# Patient Record
Sex: Male | Born: 1945 | Race: White | Hispanic: No | Marital: Married | Smoking: Former smoker
Health system: Southern US, Community
[De-identification: ages and names within clinical notes are randomized; demographics above are authoritative.]

## PROBLEM LIST (undated history)

## (undated) DIAGNOSIS — F419 Anxiety disorder, unspecified: Secondary | ICD-10-CM

## (undated) DIAGNOSIS — I219 Acute myocardial infarction, unspecified: Secondary | ICD-10-CM

## (undated) DIAGNOSIS — Z8719 Personal history of other diseases of the digestive system: Secondary | ICD-10-CM

## (undated) DIAGNOSIS — F32A Depression, unspecified: Secondary | ICD-10-CM

## (undated) DIAGNOSIS — T4145XA Adverse effect of unspecified anesthetic, initial encounter: Secondary | ICD-10-CM

## (undated) DIAGNOSIS — R51 Headache: Secondary | ICD-10-CM

## (undated) DIAGNOSIS — G709 Myoneural disorder, unspecified: Secondary | ICD-10-CM

## (undated) DIAGNOSIS — I2510 Atherosclerotic heart disease of native coronary artery without angina pectoris: Secondary | ICD-10-CM

## (undated) DIAGNOSIS — F329 Major depressive disorder, single episode, unspecified: Secondary | ICD-10-CM

## (undated) DIAGNOSIS — T8859XA Other complications of anesthesia, initial encounter: Secondary | ICD-10-CM

## (undated) DIAGNOSIS — R0602 Shortness of breath: Secondary | ICD-10-CM

## (undated) DIAGNOSIS — I10 Essential (primary) hypertension: Secondary | ICD-10-CM

## (undated) DIAGNOSIS — M5090 Cervical disc disorder, unspecified, unspecified cervical region: Secondary | ICD-10-CM

## (undated) DIAGNOSIS — M7120 Synovial cyst of popliteal space [Baker], unspecified knee: Secondary | ICD-10-CM

## (undated) DIAGNOSIS — K219 Gastro-esophageal reflux disease without esophagitis: Secondary | ICD-10-CM

## (undated) DIAGNOSIS — E7800 Pure hypercholesterolemia, unspecified: Secondary | ICD-10-CM

## (undated) DIAGNOSIS — M1990 Unspecified osteoarthritis, unspecified site: Secondary | ICD-10-CM

## (undated) HISTORY — PX: SHOULDER SURGERY: SHX246

## (undated) HISTORY — PX: COLONOSCOPY: SHX174

## (undated) HISTORY — PX: LAMINECTOMY AND MICRODISCECTOMY CERVICAL SPINE: SHX1912

## (undated) HISTORY — PX: CORONARY ANGIOPLASTY: SHX604

---

## 1999-08-11 HISTORY — PX: CORONARY STENT PLACEMENT: SHX1402

## 1999-08-15 ENCOUNTER — Emergency Department (HOSPITAL_COMMUNITY): Admission: EM | Admit: 1999-08-15 | Discharge: 1999-08-15 | Payer: Self-pay | Admitting: *Deleted

## 2000-01-05 ENCOUNTER — Inpatient Hospital Stay (HOSPITAL_COMMUNITY): Admission: EM | Admit: 2000-01-05 | Discharge: 2000-01-07 | Payer: Self-pay | Admitting: Emergency Medicine

## 2000-01-05 ENCOUNTER — Encounter: Payer: Self-pay | Admitting: *Deleted

## 2000-02-17 ENCOUNTER — Encounter (HOSPITAL_COMMUNITY): Admission: RE | Admit: 2000-02-17 | Discharge: 2000-05-17 | Payer: Self-pay | Admitting: Cardiology

## 2004-07-04 ENCOUNTER — Emergency Department (HOSPITAL_COMMUNITY): Admission: EM | Admit: 2004-07-04 | Discharge: 2004-07-04 | Payer: Self-pay | Admitting: Emergency Medicine

## 2005-06-12 ENCOUNTER — Encounter: Admission: RE | Admit: 2005-06-12 | Discharge: 2005-06-12 | Payer: Self-pay | Admitting: Endocrinology

## 2005-06-14 ENCOUNTER — Encounter: Admission: RE | Admit: 2005-06-14 | Discharge: 2005-06-14 | Payer: Self-pay | Admitting: Endocrinology

## 2006-10-28 ENCOUNTER — Ambulatory Visit (HOSPITAL_BASED_OUTPATIENT_CLINIC_OR_DEPARTMENT_OTHER): Admission: RE | Admit: 2006-10-28 | Discharge: 2006-10-28 | Payer: Self-pay | Admitting: Orthopedic Surgery

## 2007-09-22 ENCOUNTER — Encounter
Admission: RE | Admit: 2007-09-22 | Discharge: 2007-09-22 | Payer: Self-pay | Admitting: Physical Medicine and Rehabilitation

## 2008-08-29 ENCOUNTER — Inpatient Hospital Stay (HOSPITAL_COMMUNITY): Admission: RE | Admit: 2008-08-29 | Discharge: 2008-09-02 | Payer: Self-pay | Admitting: Neurosurgery

## 2008-08-29 ENCOUNTER — Ambulatory Visit: Payer: Self-pay | Admitting: Infectious Diseases

## 2009-03-12 ENCOUNTER — Inpatient Hospital Stay (HOSPITAL_COMMUNITY): Admission: RE | Admit: 2009-03-12 | Discharge: 2009-03-14 | Payer: Self-pay | Admitting: Neurosurgery

## 2010-11-15 LAB — COMPREHENSIVE METABOLIC PANEL
ALT: 22 U/L (ref 0–53)
AST: 24 U/L (ref 0–37)
Alkaline Phosphatase: 54 U/L (ref 39–117)
CO2: 30 mEq/L (ref 19–32)
Calcium: 9.2 mg/dL (ref 8.4–10.5)
GFR calc Af Amer: >60 mL/min (ref >60)
Potassium: 4.1 mEq/L (ref 3.5–5.1)
Sodium: 139 mEq/L (ref 135–145)
Total Protein: 6.5 g/dL (ref 6.0–8.3)

## 2010-11-15 LAB — URINALYSIS, ROUTINE W REFLEX MICROSCOPIC
Glucose, UA: NEGATIVE mg/dL
Hgb urine dipstick: NEGATIVE
Ketones, ur: NEGATIVE mg/dL
Protein, ur: NEGATIVE mg/dL

## 2010-11-15 LAB — CBC
Hemoglobin: 13.6 g/dL (ref 13.0–17.0)
MCHC: 34.6 g/dL (ref 30.0–36.0)
WBC: 6.8 10*3/uL (ref 4.0–10.5)

## 2010-11-15 LAB — DIFFERENTIAL
Eosinophils Absolute: 0.1 10*3/uL (ref 0.0–0.7)
Eosinophils Relative: 2 % (ref 0–5)
Lymphs Abs: 1.6 10*3/uL (ref 0.7–4.0)
Monocytes Relative: 5 % (ref 3–12)

## 2010-11-24 LAB — COMPREHENSIVE METABOLIC PANEL
ALT: 28 U/L (ref 0–53)
BUN: 11 mg/dL (ref 6–23)
CO2: 29 mEq/L (ref 19–32)
Calcium: 9.8 mg/dL (ref 8.4–10.5)
Creatinine, Ser: 0.98 mg/dL (ref 0.4–1.5)
GFR calc non Af Amer: >60 mL/min (ref >60)
Glucose, Bld: 89 mg/dL (ref 70–99)

## 2010-11-24 LAB — DIFFERENTIAL
Eosinophils Absolute: 0.1 10*3/uL (ref 0.0–0.7)
Lymphocytes Relative: 27 % (ref 12–46)
Lymphs Abs: 1.8 10*3/uL (ref 0.7–4.0)
Neutrophils Relative %: 65 % (ref 43–77)

## 2010-11-24 LAB — CBC
HCT: 43.3 % (ref 39.0–52.0)
Hemoglobin: 14.5 g/dL (ref 13.0–17.0)
MCHC: 33.5 g/dL (ref 30.0–36.0)
MCV: 91.3 fL (ref 78.0–100.0)
RBC: 4.75 MIL/uL (ref 4.22–5.81)

## 2010-11-24 LAB — PROTIME-INR
INR: 0.9 (ref 0.00–1.49)
Prothrombin Time: 12.5 seconds (ref 11.6–15.2)

## 2010-11-24 LAB — URINALYSIS, ROUTINE W REFLEX MICROSCOPIC
Hgb urine dipstick: NEGATIVE
Protein, ur: NEGATIVE mg/dL
Urobilinogen, UA: 0.2 mg/dL (ref 0.0–1.0)
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
## 2010-12-23 NOTE — Op Note (Signed)
NAME:  KROSS, SWALLOWS NO.:  [id number]   MEDICAL RECORD NO.:  [id number]          PATIENT TYPE:  INP   LOCATION:  3028                         FACILITY:  [facility]   PHYSICIAN:  Payton Doughty, M.D.      DATE OF BIRTH:  [birth year]   DATE OF PROCEDURE:  03/12/2009  DATE OF DISCHARGE:                               OPERATIVE REPORT   PREOPERATIVE DIAGNOSIS:  Spondylosis C6-7.   POSTOPERATIVE DIAGNOSIS:  Spondylosis C6-7.   PROCEDURE:  C6-7 posterior cervical fusion.   SURGEON:  Payton Doughty, MD   ANESTHESIA:  General endotracheal.   PREPARATION:  Prepped and draped with alcohol wipe.   COMPLICATIONS:  None.   ASSISTANT:  Cristi Loron, MD   BODY OF TEXT:  This is a 65 year old gentleman who had an anterior  decompression and fusion done from C3 through C6 about 8 months ago has  had increasing neck pain and films show spondylosis at C6-7.  He was  taken to the operating room, and after shave, prep and drape in the  usual sterile fashion, he was placed on the Stryker table and smoothly  anesthetized and intubated.  He was placed prone on the Stryker table  with 5 pounds of Gardner-Wells traction.  Following shave, prep and  drape in the usual sterile fashion, the skin was incised from the bottom  of C7 to the top of C6, and lamina of C6 and C7 were exposed in the  subperiosteal plane.  Intraoperative x-ray confirmed correctness of  level.  Having confirmed correctness of level, lateral mass screws were  placed in C6 and C7.  Using the standard landmarks, good bone was  encountered throughout.  The screws were linked with a rod and then  tightened and the locking caps placed.  They were tightened with final  tightener.  Wound was irrigated.  Hemostasis assured.  The lamina and  facet joints were decorticated and packed with BMP on the Actifuse  extender matrix.  Successive layers of 0 Vicryl, 2-0 Vicryl, and 3-0  nylon were used to close.  Betadine and Telfa  dressing was applied, made  occlusive with the OpSite, and the patient returned to the recovery room  in good condition.           ______________________________  Payton Doughty, M.D.     MWR/MEDQ  D:  03/12/2009  T:  03/13/2009  Job:  [id number]

## 2010-12-23 NOTE — Op Note (Signed)
NAME:  SONY, SCHLARB NO.:  [id number]   MEDICAL RECORD NO.:  [id number]          PATIENT TYPE:  INP   LOCATION:  3015                         FACILITY:  [facility]   PHYSICIAN:  Payton Doughty, M.D.      DATE OF BIRTH:  03-11-1946   DATE OF PROCEDURE:  08/29/2008  DATE OF DISCHARGE:                               OPERATIVE REPORT   PREOPERATIVE DIAGNOSIS:  Cervical spondylosis C3-4, C4-5, C5-6.   PREOPERATIVE DIAGNOSIS:  Cervical spondylosis C3-4, C4-5, C5-6.   PROCEDURE:  C3-4, C4-5, C5-6 anterior cervical decompression and fusion  with a Reflex hybrid plate.   SURGEON:  Payton Doughty, MD   ANESTHESIA:  General endotracheal.   PREPARATION:  Prepped and draped with alcohol wipe.   COMPLICATIONS:  None.   NURSE ASSISTANT:  Bedelia Person, MD   DOCTOR ASSISTANT:  Danae Orleans. Venetia Maxon, MD   BODY OF TEXT:  A 65 year old with cervical spondylosis and early  myelopathy, taken to the operating room, smoothly anesthetized and  intubated, placed supine on the operating table in the halter head  traction with the neck extended.  Following shave, prep and drape in the  usual sterile fashion, the skin was incised from one fingerbreadth below  the mandible paralleling sternocleidomastoid for about 10 cm.  The  platysma was identified, elevated, and divided, undermined  sternocleidomastoid was identified.  Medial dissection revealed the  carotid artery, retracted laterally to the left.  Trachea and esophagus  retracted laterally to the right exposing the bones in the anterior  cervical spine.  Marker was placed.  Intraoperative x-ray obtained to  confirm correctness level.  Diskectomy was carried out at C3-4, C4-5 and  C5-6 under gross observation.  The operating microscope was then brought  in.  We used the operating microscope in microdissection technique to  remove the remaining disks, dissect the neural foramina bilaterally and  decompress and shave the disk spaces.  At C3-4 and  C4-5 disk, the  degenerative change was severe and C-6, it was most severe.  Following  complete decompression because the [facility] would provide adequate  instrumentation for preparing the patellar bone, engineered bone was  used.  Two 7-mm and one 8-mm wedges were used.  They were tapped into  place at each level.  A Reflex hybrid plate was then placed and  tightened down.  Final x-ray showed good placement of the bone grafts,  plates, and screws.  The wound was irrigated and hemostasis assured.  Successive layers of 3-0 Vicryl and 4-0 Vicryl were used to close.  A JP  drain was exited from  the prevertebral space.  The patient was then placed in an Aspen collar  and returned to recovery room in good condition.   .           ______________________________  Payton Doughty, M.D.     MWR/MEDQ  D:  08/29/2008  T:  08/30/2008  Job:  [id number]

## 2010-12-23 NOTE — Discharge Summary (Signed)
NAME:  Clayton Anderson, Clayton Anderson NO.:  [id number]   MEDICAL RECORD NO.:  [id number]          PATIENT TYPE:  INP   LOCATION:  3015                         FACILITY:  [facility]   PHYSICIAN:  Hewitt Shorts, M.D.DATE OF BIRTH:  06-04-1946   DATE OF ADMISSION:  08/29/2008  DATE OF DISCHARGE:  09/02/2008                               DISCHARGE SUMMARY   This is a patient of Dr. Trey Sailors, who was admitted on August 29, 2008,  for a  C3-C4, C4-C5 and C5-C6 herniated disk syndrome due to cervical  spondylolysis.  The patient underwent surgery that day with Dr. Channing Mutters and  has had an uneventful stay since that time.   The patient is doing well.  He is taking well p.o.  He is up and about  in the unit 3000 without difficulties.  He has had no drainage from the  cervical wounds as Dr. Channing Mutters discontinued the drain that he had placed  during surgery 2 days ago.   The patient is moving all extremities well.  He has got 5/5 strength in  the upper extremities bilaterally including the deltoid, biceps,  triceps, intrinsics and grip.  His wound is closed with Steri-Strips and  is dry at this point.  He will be discharged home in good condition.   He will follow up with Dr. Channing Mutters in 2-3 weeks.  He understands his  discharge instructions.   DISCHARGE CONDITION:  Good.   DISCHARGE DIAGNOSIS:  Same as admission, cervical spondylolysis C3-C4,  C4-C5 and C5-C6.   DISCHARGE PRESCRIPTIONS:  Percocet 5/325 mg one to two q.4-6 h. p.r.n.  pain, I gave him 50 without a refill.  He will follow with Dr. Channing Mutters in 2-  3 weeks.  He has got instructions to call Amil Amen at our office.      Russell L. Webb Silversmith, RN      Hewitt Shorts, M.D.  Electronically Signed    RLW/MEDQ  D:  09/02/2008  T:  09/02/2008  Job:  [id number]

## 2010-12-23 NOTE — H&P (Signed)
NAME:  TARREN, Clayton Anderson NO.:  [id number]   MEDICAL RECORD NO.:  [id number]          PATIENT TYPE:  INP   LOCATION:  3028                         FACILITY:  [facility]   PHYSICIAN:  Payton Doughty, M.D.      DATE OF BIRTH:  May 05, 1946   DATE OF ADMISSION:  03/12/2009  DATE OF DISCHARGE:                              HISTORY & PHYSICAL   ADMITTING DIAGNOSIS:  Spondylosis, C6-7.   BODY OF TEXT:  This is a very nice 65 year old right-handed white  gentleman, who had anterior decompression and fusion at 3-4, 4-5, and 5-  6 about 8 months ago, doing well, but he has had increasing neck pain.  Films show that his graft is healing well, but he has degenerative  change at C6-7, is probably causing his neck pain.  He is now admitted  for posterior cervical fusion at that level.   MEDICAL HISTORY:  Remarkable for;  1. Hypertension.  2. Coronary artery disease.  3. Reflux.  4. Hypercholesterolemia.  5. Depression.   SURGICAL HISTORY:  Neck in 1992, left shoulder in 2001 and in 2008, and  he has a heart stent.   ALLERGIES:  TETANUS.   MEDICATIONS:  Darvocet, hydrocodone, Lipitor, Altace, Aciphex, Zetia,  Zoloft, aspirin, and folic acid.   FAMILY HISTORY:  Remarkable for coronary artery disease.   REVIEW OF SYSTEMS:  Remarkable for headaches, balance issues, and heart  issues.   PHYSICAL EXAMINATION:  HEENT:  Within normal limits.  NECK:  He has good range of motion of his neck considering he has had 2  operations.  CHEST:  Clear.  CARDIAC:  Regular rate and rhythm.  ABDOMEN:  Nontender with no hepatosplenomegaly.  EXTREMITIES:  Without clubbing or cyanosis.  Peripheral pulses are good.  GU:  Deferred.  NEUROLOGIC:  He is awake, alert, and oriented.  Cranial nerves are  intact.  Motor exam shows 5/5 strength throughout the upper and lower  extremities.  There is no recurrent sensory deficit.  Reflexes are  intact.  He has positive Hoffman's on the left side.   MR did  not demonstrate any new stenosis.   CLINICAL IMPRESSION:  Degenerative disk disease at C6-7, causing neck  pain.   PLAN:  Posterior cervical fusion.  The risks and benefits have been  discussed with him, and he wished to proceed.           ______________________________  Payton Doughty, M.D.     MWR/MEDQ  D:  03/12/2009  T:  03/13/2009  Job:  [id number]

## 2010-12-26 NOTE — Cardiovascular Report (Signed)
[place]. [facility]  Patient:    Clayton Anderson, Clayton Anderson                         MRN: [id number] Proc. Date: 01/06/00 Attending:  Darden Palmer., M.D. CC:         Alfonse Alpers. Dagoberto Ligas, M.D.                        Cardiac Catheterization  HISTORY:  The patient is a 65 year old male who presented with crescendo angina and had positive troponins and anterior T wave inversion noted on ECG.  DESCRIPTION OF PROCEDURE:  The patient was brought to the catheterization lab and was prepped and draped in the usual manner.  After Xylocaine anesthesia, a 6 French sheath was placed in the right femoral artery percutaneously. Angiograms were made using 6 French catheters and a 30 cc ventriculogram was performed.  Arrangements were made to do angioplasty of this marginal and stenting of the LAD lesion.  Heparin 4300 units was administered with an ACT of 370 achieved with this.  This was based on weight-based heparin. Integrilin was given using double bolus and constant infusion.  The patient had been premedicated with Plavix.  Nitroglycerin intravenously was used.  Angioplasty equipment used was a 7 [place] guiding catheter.  An HTF-J guidewire advanced through the LAD lesion easily and was predilated with a 2.0 x 15 CrossSail balloon.  A 3.0 x 13 mm Tetra Multi-Link stent was deployed to 13 atmospheres distally.  The balloon was withdrawn slightly and dilated to 16 atmospheres more proximally with an excellent angiographic result. postdilatation angiograms were then obtained.  The guidewire was then directed down through the circumflex stenosis and was dilated with the same 2.0 x 15 mm CrossSail balloon with an excellent result with less than 20% stenosis.  He had typical angina with balloon inflations.  Versed 2 mg IV was used for sedation.  Following the procedure, the sheath was sutured into place and adequate hemostasis and pedal pulses were present.  HEMODYNAMIC DATA:   Aorta post contrast 134/67, LV post contrast 134/17.  ANGIOGRAPHIC DATA:  LEFT VENTRICULOGRAM:  The left ventriculogram performed in the 30 degree RAO projection.  The aortic valve was normal.  The mitral valve was normal.  The left ventricle was normal in size.  There ejection fraction was estimated at 65-70%.  Coronary arteries arise and distribute normally.  There is calcification involving the ostium of the right coronary artery.  Left main:  The left main coronary artery appears normal.  Left anterior descending:  The left anterior descending has an eccentric mildly segmental stenosis prior to the bifurcation of a large diagonal branch. Mild distal disease is present.  Circumflex:  The circumflex contains two marginal branches.  There is a severe 90% stenosis in the midportion prior to the second marginal.  The right coronary artery ostium is calcified but the vessel with widely patent with a large posterior descending and posterolateral branch.  Postdilatation angiograms of the left anterior descending reveal 0% residual stenosis with no dissection and plane and preservation of all side branches.  Postdilatation angiograms of the circumflex reveal less than 20% stenosis with an excellent angiographic result.  IMPRESSION: 1. Successful stenting and percutaneous transluminal coronary angioplasty of    the left anterior descending stenosis going from 99% to 0%. 2. Successful percutaneous transluminal coronary angioplasty of the circumflex    marginal branch  going from 90% to less than 20%. 3. Normal left ventricular function. DD:  01/06/00 TD:  01/08/00 Job: [id number]
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
## 2010-12-26 NOTE — Op Note (Signed)
NAME:  TEJ, MURDAUGH NO.:  [id number]   MEDICAL RECORD NO.:  [id number]          PATIENT TYPE:  AMB   LOCATION:  DSC                          FACILITY:  [facility]   PHYSICIAN:  Rodney A. Mortenson, M.D.DATE OF BIRTH:  1945/10/10   DATE OF PROCEDURE:  10/28/2006  DATE OF DISCHARGE:                               OPERATIVE REPORT   JUSTIFICATION:  A 65 year old male who in 1997 had subacromial  decompression of the left shoulder by me.  He has done well for many  years.  He presented to the office with recurrent problems in his left  shoulder.  This was treated conservatively with subacromial injections  and injection of the biceps.  Because of persistent pain and discomfort  an MRI of the shoulder was done.  This showed some surgical changes  about the distal end of the clavicle and also some distal clavicle spurs  on the inferior surface with mild mass effects on the supraspinatus  tendon.  No rotator cuff tears were seen.  There is a superior labral  tear and a small paralabral cyst.  Because of persistent pain and  discomfort he is now admitted for surgical correction.  Complications  discussed preoperatively.  Questions answered and encouraged.  No  guarantee as to outcome was promised and he clearly understands but  wishes to proceed.   Justification for outpatient surgical minimal morbidity.   PREOP DIAGNOSIS:  Anterior slap lesion left shoulder with tear of  labrum; inferior bone spurs distal clavicle with impingement on the  supraspinatus and rotator cuff without a rotator cuff tear.   POSTOP DIAGNOSIS:  Anterior slap lesion left shoulder with tear of  labrum; inferior bone spurs distal clavicle with impingement on the  supraspinatus and rotator cuff without a rotator cuff tear.   OPERATION:  Arthroscopic evaluation of the shoulder:  Debridement  articular surfaces supraspinatus with some fraying; repair anterior slap  lesion using a Push-Loc stabilizer;  arthroscopic debridement distal  clavicle and undersurface of distal clavicle.   SURGEON:  Lenard Galloway. Chaney Malling, M.D.   Threasa HeadsChestine Spore.   ANESTHESIA:  General.   PROCEDURE:  The patient was placed on the operating room table in the  supine position.  After satisfactory general anesthesia, the patient was  placed in the semi-sitting position.  The left upper extremity and  shoulder was then prepped with DuraPrep and draped out in the usual  manner.  Through a posterior portal arthroscope was introduced into the  glenohumeral joint and two operative portals were placed anteriorly, one  inferior to the biceps and one just above the subscapularis.  Once this  was accomplished to my satisfaction excellent visualization of the  glenohumeral joint was achieved.   Articular cartilage over the proximal humeral head and the glenoid  appeared normal.  The biceps tendon appeared normal as it came into the  shoulder, but there was a tear of the anterior aspect of the superior  labrum off the anterior aspect of the glenoid.  This was followed  posteriorly and posterior to the biceps there was  no instability about  the labrum.  The subscapularis appeared normal.  Careful examination of  the rotator cuff was done and no tears were seen but there was an area  of fraying on the articular surface only.  It was felt that repair of  the slap lesion was indicated.   A bur was introduced anteriorly through an anterior portal.  The labrum  was debrided with the bur.  Once this was accomplished a suture passer  was passed through the anterior-inferior portal and the lasso was passed  through the labrum.  The FiberWire was then passed through the lasso and  brought out anteriorly and this was retrieved through the anterior  superior portal and brought out superiorly.  The lasso was removed and a  second suture through the labrum was retrieved through the anterior  superior portal.   Once this  accomplished the drill guide was brought into the shoulder and  placed on the anterior superior aspect of the glenoid.  A drill was then  passed down the guide and pushed to the stop.  An excellent stable hole  was achieved.  This measured with a small red rod and a nice hole was  obtained.  Sutures were then retrieved in the superior portal and  brought out through the anterior inferior portal and placed in the Push-  Loc eyelet.  The Push-Loc was then passed down the cannula and as the  red flexible rod was removed the Push-Loc is passed down in the hole to  the base of the hole.  Sutures were tightened up.  The glenoid labrum  was brought down to the area that was roughened up previously and the  sutures were tightened and the Push-Loc was driven down in with a small  hammer.  An excellent repair and stable repair was achieved.  Excess  suture was then cut using the suture cutter.   A probe was then passed down through the anterior portal and the labrum  was extremely stable and well fixed in a good position.  I was very  happy with the technical result the arthroscope was removed from the  shoulder.   The arthroscope was then placed in the subacromial space. The  subacromial bursa was debrided with intra-articular shaver.  The  ArthroCare wand was inserted.  Bleeders were coagulated.  The inferior  surface of the distal clavicle could be seen.  The [facility] joint was widened  from previous surgery.  On the inferior aspect of the distal clavicle  there was a large bone that was taken down and the clavicle could be  pushed down onto the supraspinatus.  A large bur was inserted through  the lateral portal and the inferior surface of the clavicle was debrided  very aggressively.  Bleeders were coagulated with the ArthroCare wand.  Excellent decompression was achieved.   The arthroscope was then removed.  Two of the wounds anteriorly were closed with sutures.  Sterile dressings were applied  and the patient  returned to the recovery room in excellent condition.  Technically this  procedure went extremely well.   FOLLOW UP CARE:  1. To my office on Wednesday.  2. Percocet for pain.  3. Usual postop instructions.           ______________________________  Lenard Galloway Chaney Malling, M.D.     RAM/MEDQ  D:  10/28/2006  T:  10/28/2006  Job:  [id number]

## 2010-12-26 NOTE — Discharge Summary (Signed)
[place]  Patient:    Clayton Anderson, Clayton Anderson                          MRN: [id number] Adm. Date:  [id number] Disc. Date: [id number] Attending:  Norman Clay CC:         Alfonse Alpers. Dagoberto Ligas, M.D.                           Discharge Summary  FINAL DIAGNOSES: 1.  Unstable angina pectoris - stenting of the LAD done this admission. 2.  Hyperlipidemia under treatment. 3.  Cigarette abuse.  PROCEDURES:  Catheterization, angioplasty and stenting of the LAD, angioplasty of the circumflex.  This 65 -year-old male has a history of cigarette abuse and peripheral vascular disease and dyslipidemia.  He presented with crescendo chest discomfort over the past several days prior to admission radiating to the arms which was progressive and associated with significant dyspnea.  Please see the previously dictated history and physical for the remainder of the details.  HOSPITAL COURSE:  The patient was admitted to the telemetry floor.  White count was 7700, hematocrit 39.8, hemoglobin 14.6, PT and PTT were normal. Chemistry panel was normal initially.  CPK MBs were negative serially. troponin one value was elevated at 0.4, the others were normal.  Lipid panel showed a cholesterol of 161, HDL of 43 and LDL of 96, TSH was 1.423.  Serial ECGs showed the development of anterior ischemia.  The patient was begun on heparin and IV nitroglycerin, beta-blockers and aspirin.  He was taken to the catheterization laboratory the next morning with findings of normal LV function.  The LAD had a severe eccentric segmental stenosis prior to the bifurcation of the large diagonal branch.  The circumflex had two marginal branches and there was a severe 90% stenosis in the mid portion part of the second marginal branch.  The right carotid was calcified but was widely patent.  The patient underwent angioplasty and stenting of the LAD with a 3.0x13 mm Tetris multi-length stent to  16 atmospheres proximally and 13 distally.  A 2.0x15 mm crosssail balloon was dilated with less than 20% residual in the circumflex.  The patient remained stable following the procedure and was treated with Integrilin post procedure.  He was anxious and complaining of some sharp, atypical chest discomfort but negative EKG and CPKs.  He was referred to the phase 2 rehab program and was discharged the next day in stable condition.  MEDS ON DISCHARGE:  Include aspirin 325 mg/d, Pravachol as before, Vioxx as before, Plavix 75 mg daily, Toprol XL 50 mg/d, nitroglycerin p.r.n.  He is instructed to call the office and follow up in one week and is to call if there are problems. DD:  02/10/00 TD:  02/10/00 Job: [id number]

## 2011-06-23 ENCOUNTER — Other Ambulatory Visit: Payer: Self-pay | Admitting: Otolaryngology

## 2011-06-23 DIAGNOSIS — K219 Gastro-esophageal reflux disease without esophagitis: Secondary | ICD-10-CM

## 2011-07-16 ENCOUNTER — Ambulatory Visit
Admission: RE | Admit: 2011-07-16 | Discharge: 2011-07-16 | Disposition: A | Discharge status: Home / Self Care | Payer: 59 | Source: Ambulatory Visit | Attending: Otolaryngology | Admitting: Otolaryngology

## 2011-07-16 DIAGNOSIS — K219 Gastro-esophageal reflux disease without esophagitis: Secondary | ICD-10-CM

## 2011-07-19 ENCOUNTER — Other Ambulatory Visit: Payer: Self-pay

## 2011-07-19 ENCOUNTER — Encounter: Payer: Self-pay | Admitting: *Deleted

## 2011-07-19 ENCOUNTER — Inpatient Hospital Stay (HOSPITAL_COMMUNITY)
Admission: EM | Admit: 2011-07-19 | Discharge: 2011-07-21 | DRG: 287 | Disposition: A | Discharge status: Home / Self Care | Payer: 59 | Source: Ambulatory Visit | Attending: Cardiology | Admitting: Cardiology

## 2011-07-19 DIAGNOSIS — R079 Chest pain, unspecified: Secondary | ICD-10-CM

## 2011-07-19 DIAGNOSIS — R0989 Other specified symptoms and signs involving the circulatory and respiratory systems: Secondary | ICD-10-CM | POA: Diagnosis present

## 2011-07-19 DIAGNOSIS — R5381 Other malaise: Secondary | ICD-10-CM | POA: Diagnosis present

## 2011-07-19 DIAGNOSIS — E785 Hyperlipidemia, unspecified: Secondary | ICD-10-CM | POA: Diagnosis present

## 2011-07-19 DIAGNOSIS — M5030 Other cervical disc degeneration, unspecified cervical region: Secondary | ICD-10-CM | POA: Diagnosis present

## 2011-07-19 DIAGNOSIS — R911 Solitary pulmonary nodule: Secondary | ICD-10-CM | POA: Diagnosis present

## 2011-07-19 DIAGNOSIS — I252 Old myocardial infarction: Secondary | ICD-10-CM

## 2011-07-19 DIAGNOSIS — I10 Essential (primary) hypertension: Secondary | ICD-10-CM | POA: Diagnosis present

## 2011-07-19 DIAGNOSIS — J438 Other emphysema: Secondary | ICD-10-CM | POA: Diagnosis present

## 2011-07-19 DIAGNOSIS — R0789 Other chest pain: Principal | ICD-10-CM | POA: Diagnosis present

## 2011-07-19 DIAGNOSIS — R5383 Other fatigue: Secondary | ICD-10-CM | POA: Diagnosis present

## 2011-07-19 DIAGNOSIS — I2510 Atherosclerotic heart disease of native coronary artery without angina pectoris: Secondary | ICD-10-CM

## 2011-07-19 DIAGNOSIS — R0609 Other forms of dyspnea: Secondary | ICD-10-CM | POA: Diagnosis present

## 2011-07-19 DIAGNOSIS — Z7982 Long term (current) use of aspirin: Secondary | ICD-10-CM

## 2011-07-19 HISTORY — DX: Shortness of breath: R06.02

## 2011-07-19 HISTORY — DX: Depression, unspecified: F32.A

## 2011-07-19 HISTORY — DX: Unspecified osteoarthritis, unspecified site: M19.90

## 2011-07-19 HISTORY — DX: Gastro-esophageal reflux disease without esophagitis: K21.9

## 2011-07-19 HISTORY — DX: Atherosclerotic heart disease of native coronary artery without angina pectoris: I25.10

## 2011-07-19 HISTORY — DX: Myoneural disorder, unspecified: G70.9

## 2011-07-19 HISTORY — DX: Major depressive disorder, single episode, unspecified: F32.9

## 2011-07-19 HISTORY — DX: Cervical disc disorder, unspecified, unspecified cervical region: M50.90

## 2011-07-19 HISTORY — DX: Essential (primary) hypertension: I10

## 2011-07-19 HISTORY — DX: Acute myocardial infarction, unspecified: I21.9

## 2011-07-19 HISTORY — DX: Anxiety disorder, unspecified: F41.9

## 2011-07-19 HISTORY — DX: Headache: R51

## 2011-07-19 NOTE — ED Provider Notes (Signed)
History     CSN: [id number] Arrival date & time: 07/19/2011 11:03 PM   First MD Initiated Contact with Patient 07/19/11 2337      Chief Complaint  Patient presents with  . Chest Pain    (Consider location/radiation/quality/duration/timing/severity/associated sxs/prior treatment) Patient is a 65 y.o. male presenting with chest pain. The history is provided by the patient. No language interpreter was used.  Chest Pain The chest pain began 3 - 5 hours ago. Chest pain occurs constantly. The chest pain is unchanged. The pain is associated with breathing. At its most intense, the pain is at 2/10. The pain is currently at 2/10. The quality of the pain is described as dull. The pain does not radiate. Exacerbated by: nothing. Primary symptoms include shortness of breath and nausea. Pertinent negatives for primary symptoms include no fever, no fatigue, no cough, no wheezing, no palpitations, no abdominal pain, no vomiting, no dizziness and no altered mental status.  Associated symptoms include diaphoresis and weakness.  Pertinent negatives for associated symptoms include no claudication. He tried nitroglycerin for the symptoms. Risk factors include being elderly and male gender.  His past medical history is significant for CAD and MI.  Pertinent negatives for family medical history include: family history of aortic dissection.  Procedure history is positive for cardiac catheterization, echocardiogram and stress echo.     Past Medical History  Diagnosis Date  . Myocardial infarct   . Hypertension     Past Surgical History  Procedure Date  . Carotid stent     History reviewed. No pertinent family history.  History  Substance Use Topics  . Smoking status: Never Smoker   . Smokeless tobacco: Not on file  . Alcohol Use: No      Review of Systems  Constitutional: Positive for diaphoresis. Negative for fever and fatigue.  HENT: Negative for facial swelling.   Respiratory: Positive  for shortness of breath. Negative for cough and wheezing.   Cardiovascular: Positive for chest pain. Negative for palpitations and claudication.  Gastrointestinal: Positive for nausea. Negative for vomiting, abdominal pain and abdominal distention.  Genitourinary: Negative for difficulty urinating.  Musculoskeletal: Negative for arthralgias.  Neurological: Positive for weakness. Negative for dizziness and headaches.  Hematological: Negative.   Psychiatric/Behavioral: Negative.  Negative for altered mental status.    Allergies  Sulfa antibiotics and Tetanus toxoids  Home Medications   Current Outpatient Rx  Name Route Sig Dispense Refill  . AMLODIPINE BESYLATE 5 MG PO TABS Oral Take 5 mg by mouth daily.      . ASPIRIN EC 81 MG PO TBEC Oral Take 81 mg by mouth daily.      . CYCLOBENZAPRINE HCL 10 MG PO TABS Oral Take 10 mg by mouth 3 (three) times daily as needed. For muscle spasms     . DEXLANSOPRAZOLE 60 MG PO CPDR Oral Take 60 mg by mouth daily.      Marland Kitchen FOLIC ACID 800 MCG PO TABS Oral Take 800 mcg by mouth daily.      . OXYCODONE-ACETAMINOPHEN 10-325 MG PO TABS Oral Take 1 tablet by mouth every 6 (six) hours as needed. For pain.     Marland Kitchen RAMIPRIL 10 MG PO CAPS Oral Take 10 mg by mouth daily.      Marland Kitchen ROPINIROLE HCL 2 MG PO TABS Oral Take 2 mg by mouth at bedtime.      Marland Kitchen ROSUVASTATIN CALCIUM 40 MG PO TABS Oral Take 40 mg by mouth daily.  BP 163/81  Pulse 64  Temp(Src) 97.4 F (36.3 C) (Oral)  Resp 14  SpO2 100%  Physical Exam  Constitutional: He appears well-developed and well-nourished.  HENT:  Head: Normocephalic and atraumatic.  Eyes: EOM are normal. Pupils are equal, round, and reactive to light.  Neck: Normal range of motion. Neck supple.  Cardiovascular: Normal rate and regular rhythm.  Exam reveals no friction rub.   Pulmonary/Chest: Effort normal and breath sounds normal. No respiratory distress.  Abdominal: Soft. Bowel sounds are normal. There is no tenderness.  There is no rebound and no guarding.  Musculoskeletal: Normal range of motion. He exhibits no edema.  Skin: He is diaphoretic.  Psychiatric: He has a normal mood and affect.    ED Course  Procedures (including critical care time)  Labs Reviewed - No data to display No results found.   No diagnosis found.  Results for orders placed during the hospital encounter of 07/19/11  POCT I-STAT, CHEM 8      Component Value Range   Sodium 141  135 - 145 (mEq/L)   Potassium 3.8  3.5 - 5.1 (mEq/L)   Chloride 101  96 - 112 (mEq/L)   BUN 21  6 - 23 (mg/dL)   Creatinine, Ser 1.61 (*) 0.50 - 1.35 (mg/dL)   Glucose, Bld 096 (*) 70 - 99 (mg/dL)   Calcium, Ion 0.45  4.09 - 1.32 (mmol/L)   TCO2 28  0 - 100 (mmol/L)   Hemoglobin 15.3  13.0 - 17.0 (g/dL)   HCT 81.1  91.4 - 78.2 (%)  POCT I-STAT TROPONIN I      Component Value Range   Troponin i, poc 0.00  0.00 - 0.08 (ng/mL)   Comment 3           CBC      Component Value Range   WBC 6.1  4.0 - 10.5 (K/uL)   RBC 4.84  4.22 - 5.81 (MIL/uL)   Hemoglobin 13.8  13.0 - 17.0 (g/dL)   HCT 95.6  21.3 - 08.6 (%)   MCV 88.0  78.0 - 100.0 (fL)   MCH 28.5  26.0 - 34.0 (pg)   MCHC 32.4  30.0 - 36.0 (g/dL)   RDW 57.8  46.9 - 62.9 (%)   Platelets 177  150 - 400 (K/uL)  DIFFERENTIAL      Component Value Range   Neutrophils Relative 70  43 - 77 (%)   Neutro Abs 4.3  1.7 - 7.7 (K/uL)   Lymphocytes Relative 22  12 - 46 (%)   Lymphs Abs 1.3  0.7 - 4.0 (K/uL)   Monocytes Relative 7  3 - 12 (%)   Monocytes Absolute 0.4  0.1 - 1.0 (K/uL)   Eosinophils Relative 1  0 - 5 (%)   Eosinophils Absolute 0.1  0.0 - 0.7 (K/uL)   Basophils Relative 0  0 - 1 (%)   Basophils Absolute 0.0  0.0 - 0.1 (K/uL)   Dg Chest 2 View  07/20/2011  *RADIOLOGY REPORT*  Clinical Data: Shortness of breath  CHEST - 2 VIEW  Comparison: 08/23/2008  Findings: The heart size and pulmonary vascularity are normal. The lungs appear clear and expanded without focal air space disease or  consolidation. No blunting of the costophrenic angles.  No pneumothorax.  Bilateral cervical ribs.  Postoperative changes in the lower cervical spine.  Degenerative changes in the spine. Stable appearance since previous study.  IMPRESSION: No evidence of active pulmonary disease.  Original Report Authenticated By:  Marlon Pel, M.D.   Dg Esophagus  07/16/2011  *RADIOLOGY REPORT*  Clinical Data: Dysphagia.  History of cervical spine fusion.  ESOPHOGRAM/BARIUM SWALLOW  Technique:  Combined double contrast and single contrast examination performed using effervescent crystals, thick barium liquid, and thin barium liquid.  Fluoroscopy time:  1.5 minutes.  Comparison:  None.  Findings:  Initial barium swallows demonstrate grossly normal pharyngeal motion with swallowing.  No laryngeal penetration or aspiration.  No upper esophageal webs, strictures or diverticuli. I do not see any abnormal soft tissue thickening between the esophagus and the anterior cervical plate.  Nonspecific esophageal motility disorder with occasional disruption of the primary peristaltic wave and occasional tertiary contractions.  There is a small sliding-type hiatal hernia.  No gastroesophageal reflux was demonstrated.  The 13 mm barium pill passed into the stomach without difficulty.  IMPRESSION:  1.  Esophageal dysmotility. 2.  No esophageal stricture or mass. 3.  Small sliding-type hiatal hernia.  No gastroesophageal reflux was demonstrated.  Original Report Authenticated By: P. Loralie Champagne, M.D.      MDM   Date: 07/20/2011  Rate:67  Rhythm: normal sinus rhythm  QRS Axis: normal  Intervals: normal  ST/T Wave abnormalities: normal  Conduction Disutrbances:none  Narrative Interpretation:   Old EKG Reviewed: none available   The patient appears reasonably stabilized for admission considering the current resources, flow, and capabilities available in the ED at this time, and I doubt any other [facility] requiring further  screening and/or treatment in the ED prior to admission. MDM Reviewed: vitals and nursing note Interpretation: labs, ECG and x-ray Consults: cardiology         Lalah Durango K Colonel Krauser-Rasch, MD 07/20/11 [phone]

## 2011-07-19 NOTE — ED Notes (Signed)
Patient with c/o shortness of breath that started about two hours ago.  Patient has similar symptoms in the past.  Patient also c/o mid sternal chest pain with nausea

## 2011-07-20 ENCOUNTER — Emergency Department (HOSPITAL_COMMUNITY): Payer: 59

## 2011-07-20 ENCOUNTER — Encounter (HOSPITAL_COMMUNITY)
Admission: EM | Disposition: A | Discharge status: Home / Self Care | Payer: Self-pay | Source: Ambulatory Visit | Attending: Cardiology

## 2011-07-20 ENCOUNTER — Observation Stay (HOSPITAL_COMMUNITY): Payer: 59

## 2011-07-20 ENCOUNTER — Encounter (HOSPITAL_COMMUNITY): Payer: Self-pay | Admitting: Emergency Medicine

## 2011-07-20 DIAGNOSIS — M5090 Cervical disc disorder, unspecified, unspecified cervical region: Secondary | ICD-10-CM

## 2011-07-20 DIAGNOSIS — I10 Essential (primary) hypertension: Secondary | ICD-10-CM | POA: Diagnosis present

## 2011-07-20 DIAGNOSIS — E785 Hyperlipidemia, unspecified: Secondary | ICD-10-CM | POA: Insufficient documentation

## 2011-07-20 HISTORY — DX: Cervical disc disorder, unspecified, unspecified cervical region: M50.90

## 2011-07-20 HISTORY — PX: LEFT HEART CATHETERIZATION WITH CORONARY ANGIOGRAM: SHX5451

## 2011-07-20 LAB — POCT I-STAT, CHEM 8
BUN: 21 mg/dL (ref 6–23)
Calcium, Ion: 1.15 mmol/L (ref 1.12–1.32)
Chloride: 101 mEq/L (ref 96–112)
HCT: 45 % (ref 39.0–52.0)
Potassium: 3.8 mEq/L (ref 3.5–5.1)

## 2011-07-20 LAB — CBC
Hemoglobin: 13.8 g/dL (ref 13.0–17.0)
Hemoglobin: 14.7 g/dL (ref 13.0–17.0)
MCH: 28.5 pg (ref 26.0–34.0)
MCH: 30.1 pg (ref 26.0–34.0)
Platelets: 177 10*3/uL (ref 150–400)
Platelets: 183 10*3/uL (ref 150–400)
RBC: 4.84 MIL/uL (ref 4.22–5.81)
RBC: 4.89 MIL/uL (ref 4.22–5.81)
WBC: 6.1 10*3/uL (ref 4.0–10.5)
WBC: 6.6 10*3/uL (ref 4.0–10.5)

## 2011-07-20 LAB — CARDIAC PANEL(CRET KIN+CKTOT+MB+TROPI)
CK, MB: 2.2 ng/mL (ref 0.3–4.0)
CK, MB: 2.3 ng/mL (ref 0.3–4.0)
Relative Index: INVALID (ref 0.0–2.5)
Relative Index: INVALID (ref 0.0–2.5)
Total CK: 41 U/L (ref 7–232)
Total CK: 45 U/L (ref 7–232)
Troponin I: <0.3 ng/mL (ref <0.30)

## 2011-07-20 LAB — D-DIMER, QUANTITATIVE: D-Dimer, Quant: 0.27 ug/mL-FEU (ref 0.00–0.48)

## 2011-07-20 LAB — POCT ACTIVATED CLOTTING TIME: Activated Clotting Time: 94 seconds

## 2011-07-20 LAB — DIFFERENTIAL
Eosinophils Absolute: 0.1 10*3/uL (ref 0.0–0.7)
Lymphocytes Relative: 22 % (ref 12–46)
Lymphs Abs: 1.3 10*3/uL (ref 0.7–4.0)
Monocytes Relative: 7 % (ref 3–12)
Neutro Abs: 4.3 10*3/uL (ref 1.7–7.7)
Neutrophils Relative %: 70 % (ref 43–77)

## 2011-07-20 LAB — COMPREHENSIVE METABOLIC PANEL
ALT: 16 U/L (ref 0–53)
AST: 17 U/L (ref 0–37)
Alkaline Phosphatase: 61 U/L (ref 39–117)
CO2: 24 mEq/L (ref 19–32)
GFR calc Af Amer: >90 mL/min (ref >90)
GFR calc non Af Amer: 87 mL/min — ABNORMAL LOW (ref >90)
Glucose, Bld: 96 mg/dL (ref 70–99)
Potassium: 4.1 mEq/L (ref 3.5–5.1)
Sodium: 138 mEq/L (ref 135–145)

## 2011-07-20 LAB — CREATININE, SERUM
Creatinine, Ser: 1.04 mg/dL (ref 0.50–1.35)
GFR calc non Af Amer: 74 mL/min — ABNORMAL LOW (ref >90)

## 2011-07-20 LAB — PROTIME-INR
INR: 0.87 (ref 0.00–1.49)
Prothrombin Time: 12 seconds (ref 11.6–15.2)

## 2011-07-20 LAB — POCT I-STAT TROPONIN I: Troponin i, poc: 0 ng/mL (ref 0.00–0.08)

## 2011-07-20 LAB — PRO B NATRIURETIC PEPTIDE: Pro B Natriuretic peptide (BNP): 13.7 pg/mL (ref 0–125)

## 2011-07-20 LAB — APTT: aPTT: 33 seconds (ref 24–37)

## 2011-07-20 SURGERY — LEFT HEART CATHETERIZATION WITH CORONARY ANGIOGRAM
Anesthesia: LOCAL

## 2011-07-20 MED ORDER — NITROGLYCERIN 0.4 MG SL SUBL: 0.4000 mg | SUBLINGUAL_TABLET | SUBLINGUAL | Status: DC | PRN

## 2011-07-20 MED ORDER — OXYCODONE HCL 5 MG PO TABS
5.0000 mg | ORAL_TABLET | Freq: Four times a day (QID) | ORAL | Status: DC | PRN
  Administered 2011-07-20 – 2011-07-21 (×4): 5 mg via ORAL
  Filled 2011-07-20 (×3): qty 1

## 2011-07-20 MED ORDER — AMLODIPINE BESYLATE 5 MG PO TABS
5.0000 mg | ORAL_TABLET | Freq: Once | ORAL | Status: AC
  Administered 2011-07-20: 5 mg via ORAL
  Filled 2011-07-20: qty 1

## 2011-07-20 MED ORDER — RAMIPRIL 10 MG PO CAPS
10.0000 mg | ORAL_CAPSULE | Freq: Once | ORAL | Status: AC
  Administered 2011-07-20: 10 mg via ORAL
  Filled 2011-07-20: qty 1

## 2011-07-20 MED ORDER — OXYCODONE-ACETAMINOPHEN 5-325 MG PO TABS
1.0000 | ORAL_TABLET | Freq: Four times a day (QID) | ORAL | Status: DC | PRN
  Administered 2011-07-20 – 2011-07-21 (×4): 1 via ORAL
  Filled 2011-07-20 (×3): qty 1

## 2011-07-20 MED ORDER — OXYCODONE-ACETAMINOPHEN 10-325 MG PO TABS
1.0000 | ORAL_TABLET | Freq: Four times a day (QID) | ORAL | Status: DC | PRN
  Filled 2011-07-20: qty 1

## 2011-07-20 MED ORDER — ACETAMINOPHEN 325 MG PO TABS: 650.0000 mg | ORAL_TABLET | ORAL | Status: DC | PRN

## 2011-07-20 MED ORDER — HEPARIN (PORCINE) IN NACL 2-0.9 UNIT/ML-% IJ SOLN
INTRAMUSCULAR | Status: AC
  Filled 2011-07-20: qty 2000

## 2011-07-20 MED ORDER — OXYCODONE-ACETAMINOPHEN 5-325 MG PO TABS
ORAL_TABLET | ORAL | Status: AC
  Administered 2011-07-20: 1 via ORAL
  Filled 2011-07-20: qty 1

## 2011-07-20 MED ORDER — ASPIRIN 81 MG PO CHEW
324.0000 mg | CHEWABLE_TABLET | Freq: Once | ORAL | Status: AC
  Administered 2011-07-20: 324 mg via ORAL
  Filled 2011-07-20: qty 4

## 2011-07-20 MED ORDER — SODIUM CHLORIDE 0.9 % IV SOLN
1.0000 mL/kg/h | INTRAVENOUS | Status: DC
  Administered 2011-07-20 – 2011-07-21 (×2): 1 mL/kg/h via INTRAVENOUS

## 2011-07-20 MED ORDER — NITROGLYCERIN 0.2 MG/ML ON CALL CATH LAB
INTRAVENOUS | Status: AC
  Filled 2011-07-20: qty 1

## 2011-07-20 MED ORDER — SODIUM CHLORIDE 0.9 % IV SOLN
INTRAVENOUS | Status: DC
  Administered 2011-07-20: 13:00:00 via INTRAVENOUS

## 2011-07-20 MED ORDER — DIAZEPAM 5 MG PO TABS
10.0000 mg | ORAL_TABLET | ORAL | Status: AC
  Administered 2011-07-20: 10 mg via ORAL
  Filled 2011-07-20: qty 2

## 2011-07-20 MED ORDER — ROPINIROLE HCL 1 MG PO TABS
2.0000 mg | ORAL_TABLET | Freq: Every day | ORAL | Status: DC
  Administered 2011-07-20: 2 mg via ORAL
  Filled 2011-07-20 (×2): qty 2

## 2011-07-20 MED ORDER — ONDANSETRON HCL 4 MG/2ML IJ SOLN: 4.0000 mg | Freq: Four times a day (QID) | INTRAMUSCULAR | Status: DC | PRN

## 2011-07-20 MED ORDER — ROSUVASTATIN CALCIUM 40 MG PO TABS
40.0000 mg | ORAL_TABLET | Freq: Once | ORAL | Status: AC
  Administered 2011-07-20: 40 mg via ORAL
  Filled 2011-07-20: qty 1

## 2011-07-20 MED ORDER — LIDOCAINE HCL (PF) 1 % IJ SOLN
INTRAMUSCULAR | Status: AC
Start: 2011-07-20 — End: 2011-07-20
  Filled 2011-07-20: qty 30

## 2011-07-20 MED ORDER — IOHEXOL 300 MG/ML  SOLN
80.0000 mL | Freq: Once | INTRAMUSCULAR | Status: AC | PRN
  Administered 2011-07-20: 80 mL via INTRAVENOUS

## 2011-07-20 MED ORDER — ALUM & MAG HYDROXIDE-SIMETH 200-200-20 MG/5ML PO SUSP
ORAL | Status: AC
  Administered 2011-07-20: 30 mL
  Filled 2011-07-20: qty 30

## 2011-07-20 MED ORDER — ASPIRIN EC 81 MG PO TBEC: 81.0000 mg | DELAYED_RELEASE_TABLET | Freq: Every day | ORAL | Status: DC

## 2011-07-20 MED ORDER — HEPARIN SODIUM (PORCINE) 5000 UNIT/ML IJ SOLN
5000.0000 [IU] | Freq: Three times a day (TID) | INTRAMUSCULAR | Status: DC
  Administered 2011-07-20 (×2): 5000 [IU] via SUBCUTANEOUS
  Filled 2011-07-20 (×4): qty 1

## 2011-07-20 MED ORDER — ASPIRIN EC 81 MG PO TBEC
81.0000 mg | DELAYED_RELEASE_TABLET | Freq: Once | ORAL | Status: DC
  Filled 2011-07-20: qty 1

## 2011-07-20 MED ORDER — PANTOPRAZOLE SODIUM 40 MG PO TBEC
40.0000 mg | DELAYED_RELEASE_TABLET | Freq: Every day | ORAL | Status: DC
  Administered 2011-07-20: 40 mg via ORAL
  Filled 2011-07-20: qty 1

## 2011-07-20 MED ORDER — ASPIRIN 81 MG PO CHEW
324.0000 mg | CHEWABLE_TABLET | ORAL | Status: AC
  Administered 2011-07-20: 324 mg via ORAL
  Filled 2011-07-20: qty 4

## 2011-07-20 MED ORDER — MIDAZOLAM HCL 2 MG/2ML IJ SOLN
INTRAMUSCULAR | Status: AC
  Filled 2011-07-20: qty 2

## 2011-07-20 MED ORDER — OXYCODONE HCL 5 MG PO TABS
ORAL_TABLET | ORAL | Status: AC
  Administered 2011-07-20: 5 mg via ORAL
  Filled 2011-07-20: qty 1

## 2011-07-20 MED ORDER — ASPIRIN 300 MG RE SUPP: 300.0000 mg | RECTAL | Status: AC

## 2011-07-20 NOTE — Brief Op Note (Signed)
07/19/2011 - 07/20/2011  3:54 PM  PATIENT:  Clayton Anderson  65 y.o. male  PRE-OPERATIVE DIAGNOSIS:  chest pain  POST-OPERATIVE DIAGNOSIS:  Chest pain of undetermined etiology  PROCEDURE:  Procedure(s): LEFT HEART CATHETERIZATION WITH CORONARY ANGIOGRAM  Cardiac cath done without complications. Patient taken the holding area for sheath removal. ACT was 94.  W.  Ashley Royalty M.D. FACC

## 2011-07-20 NOTE — Progress Notes (Signed)
Subjective:  Admitted last night with worsening dyspnea and mild pressure. He came in because he developed some dyspnea rest associated with mild chest pressure. There were no EKG changes. His enzymes have been negative thus far. He has had a prolonged episode of dyspnea lasting around 8 hours associated with pressure but is not had much in the way of exertional symptoms.  Objective:  Vital Signs in the last 24 hours: BP 128/53  Pulse 64  Temp(Src) 98.2 F (36.8 C) (Oral)  Resp 13  SpO2 96%  Physical Exam: Pleasant male in no acute distress Lungs:  Clear to A&P Cardiac:  Regular rhythm, normal S1 and S2, no S3 Abdomen:  Soft, nontender, no masses Extremities:  No edema present  Lab Results: Basic Metabolic Panel:  Basename 07/20/11 0715 07/20/11 0042  NA -- 141  K -- 3.8  CL -- 101  CO2 -- --  GLUCOSE -- 141*  BUN -- 21  CREATININE 1.04 1.40*   CBC:  Basename 07/20/11 0715 07/20/11 0100  WBC 6.6 6.1  NEUTROABS -- 4.3  HGB 14.7 13.8  HCT 42.7 42.6  MCV 87.3 88.0  PLT 183 177   Telemetry: Reviewed :  Normal sinus.  Assessment/Plan:  1. Unstable angina 2.  Dyspnea 3. CAD  REcommendations:  Plan cardiac cath this pm. Cardiac catheterization was discussed with the patient fully including risks of myocardial infarction, death, stroke, bleeding, arrhythmia, dye allergy, renal insufficiency or bleeding.  The patient understands and is willing to proceed. Plan possible PCI if stenosis found and he is agreeable and understands will be done by colleague.  In addition check echocardiogram as well as BNP level and liver tests.   Darden Palmer.  MD [facility] 07/20/2011, 9:48 AM

## 2011-07-20 NOTE — H&P (Addendum)
NAME:  Clayton Anderson, Clayton Anderson NO.:  [id number]  MEDICAL RECORD NO.:  [id number]  LOCATION:  [facility]                        FACILITY:  [facility]  PHYSICIAN:  Natasha Bence, MD       DATE OF BIRTH:  1946-04-24  DATE OF ADMISSION:  07/19/2011 DATE OF DISCHARGE:                             HISTORY & PHYSICAL   PRIMARY CARDIOLOGIST:  Georga Hacking, MD  CHIEF COMPLAINT:  Chest pain.  HISTORY OF PRESENT ILLNESS:  Clayton Anderson is a 65 year old white male with a known history of coronary artery disease status post stents to his LAD and left circumflex in 2001 who presented this evening with chest discomfort, shortness of breath, and diaphoresis.  He reports he has had several episodes of this that have occurred over the last 3 weeks, similarly but not as intense.  Although he does say that his chest pain is not severe, it has worsened over the last several days.  This comes on with exertion or with rest is unpredictable.  He describes it as a heaviness in the center of his chest.  He gets a flushed feeling and also has more shortness of breath.  He actually says the shortness of breath is more worse than the chest discomfort.  Several of these episodes have occurred at rest.  Prior to 3 weeks ago, he has not had any problems with chest discomfort or shortness of breath and he had been doing very well.  They are not associated with any nausea or vomiting.  He denies any palpitations or lightheadedness.  No history of syncope.  He denies any PND or orthopnea.  Otherwise, he has not had any cough or wheezing.  REVIEW OF SYSTEMS:  On rest of review of systems, he denies fevers, chills, or sweats.  He denies nausea, vomiting, or diarrhea.  No melena or hematochezia.  He has chronic neck pain and had surgery recently.  He does also have chronic back pain and he has recently had a steroid injection in early November.  SKIN:  No rashes or ulcers.  GU:  No dysuria or hematuria.  PSYCH:   Within normal limits.  Rest of 12-point review of systems was performed and was otherwise normal.  PAST MEDICAL HISTORY: 1. Coronary artery disease, status post stenting in 2001 to his LAD     and left circumflex. 2. Dyslipidemia. 3. Hypertension.  PAST SURGICAL HISTORY:  He has had neck surgery.  FAMILY HISTORY:  No known coronary artery disease.  SOCIAL HISTORY:  He is a former smoker, quit 7 years ago.  He denies alcohol or illicit drug use.  ALLERGIES:  He is allergic to: 1. SULFA. 2. TETANUS SHOTS.  MEDICATIONS:  He is on: 1. Norvasc 5 mg p.o. daily. 2. Aspirin 81 mg p.o. daily. 3. Flexeril 10 mg p.o. t.i.d. 4. Dexilant 60 mg p.o. daily. 5. Folic acid 800 mcg p.o. daily. 6. Percocet 10/325 mg p.o. every 6 hours for pain. 7. Altace 10 mg p.o. daily. 8. Requip 2 mg p.o. at bedtime. 9. Crestor 40 mg p.o. daily.  PHYSICAL EXAMINATION:  VITAL SIGNS:  He is afebrile.  Temperature 97.4, pulse is 66  and regular, respiratory rate of 20, blood pressure 137/68, O2 sats 99% on room air. GENERAL:  He is well-developed white male, in no apparent distress. HEENT:  Eyes:  He has anicteric sclerae.  Pupils are equal, round, and reactive to light.  Mucous membranes are moist. NECK:  He has normal jugular venous pressure.  No carotid bruits.  He has a left well-healed scar.  No thyromegaly. LUNGS:  Clear to auscultation bilaterally. CARDIOVASCULAR :  He has regular rate and rhythm with no murmurs, rubs, or gallops. ABDOMEN:  Soft, nontender.  No bruits. EXTREMITIES:  Warm with no edema.  He has symmetrical pulses throughout. SKIN:  No rashes or ulcers. NEURO:  Grossly afocal.  LABORATORY DATA:  Sodium 141, potassium 3.8, chloride of 101, bicarb of 28, BUN of 21, creatinine of 1.4, glucose of 141.  White blood cell count 6.1, hematocrit of 43, and platelet count 177.  Troponin was 0.0. Chest x-ray showed no acute infiltrates or effusions.  EKG showed normal sinus rhythm with a  rate of 65 beats per minute.  No ST changes suggestive of ischemia.  IMPRESSION AND PLAN:  This is a 65 year old male with a history of known coronary artery disease status post percutaneous coronary intervention to his left anterior descending and his left circumflex in 2001, also has hypertension, dyslipidemia who presents with chest discomfort. Although his chest discomfort that he describes is not that severe, it is worrisome for worsening anginal pattern.  He has been doing relatively well until the last 3 weeks, and he has had increasing episodes of this shortness of breath and chest discomfort.  Given his history of remote stenting, I feel he needs admission for ruling out myocardial infarction and further evaluation.  We will admit him for serial cardiac enzymes and telemetry.  Based on what his labs given the symptomatology, we will decide on either stress testing versus heart catheterization in the morning.  He is currently pain-free now.  We will continue his current medical regimen as it is.  He will be placed on heparin for DVT prophylaxis.          ______________________________ Natasha Bence, MD     MH/MEDQ  D:  07/20/2011  T:  07/20/2011  Job:  [id number]

## 2011-07-20 NOTE — ED Notes (Signed)
Family at bedside. 

## 2011-07-20 NOTE — Op Note (Addendum)
Clayton Anderson [id number] 05/15/1946   PROCEDURE:  Left heart catheterization with selective coronary angiography, left ventriculogram.  INDICATIONS:  Recurrent chest pain in a patient with previous coronary artery stenting and coronary artery disease.The risks, benefits, and details of the procedure were explained to the patient.  The patient verbalized understanding and wanted to proceed.  Informed written consent was obtained.  PROCEDURE TECHNIQUE:  After Xylocaine anesthesia a 66F sheath was placed in the right femoral artery with a single anterior needle wall stick.   Left coronary angiography was done using a Judkins L4 guide catheter.  Right coronary angiography was done using a Judkins R4 guide catheter. A 30 cc left ventriculogram was performed. An ACT was measured and was 94 the patient was taken to the holding area for sheath removal.  CONTRAST:  Total of 65 cc.  COMPLICATIONS:  None.    HEMODYNAMICS:  Aortic pressure: 133/58. Left ventricle postcontrast 133/2-11.  There was no gradient between the left ventricle and aorta.    ANGIOGRAPHIC DATA:    CORONARY ARTERIES:   Arise and distribute normally.  Right dominant. Coronary calcification is noted in the ostium of the right coronary artery.  Left main coronary artery: Normal.  Left anterior descending: Large vessel extending to the apex. The previous stent in the proximal left anterior descending is widely patent without significant stenosis..  Circumflex coronary artery: The first marginal artery has a 30-40% stenosis in its proximal portion at the site of the previous angioplasty. There is no structural disease noted in the remainder of the artery.   Right coronary artery: Calcified ostium. The artery is right dominant and contains scattered irregularities but no significant stenosis is noted.Marland Kitchen  LEFT VENTRICULOGRAM:  Performed in the 30 RAO projection.  The aortic and mitral valves are normal. The left ventricle is normal in size  with normal wall motion. Estimated ejection fraction is 60%.  IMPRESSIONS:  1. Widely patent stent site in the proximal LAD and patent PTCA site of the circumflex without interval obstructive coronary artery disease noted 2. Normal left ventricular function with normal LVEDP   RECOMMENDATION:  At this point the patient does not have significant obstructive coronary artery disease and has normal LV function. Evaluate for other causes of dyspnea at this point. Obtain CT angiogram to evaluate for pulmonary embolus.Darden Palmer MD [facility]

## 2011-07-20 NOTE — ED Notes (Addendum)
Flow manager called Marisue Ivan) about bed placement for patient. Pt to stay in ED due to lack of telemetry beds.

## 2011-07-20 NOTE — H&P (Addendum)
See dictated note #[id number].  Patient with known CAD and unstable angina    No change to prior history since last  Seen.  Darden Palmer MD

## 2011-07-20 NOTE — ED Notes (Signed)
Report given to [facility] side #[phone]

## 2011-07-21 ENCOUNTER — Encounter (HOSPITAL_COMMUNITY): Payer: Self-pay | Admitting: Cardiology

## 2011-07-21 DIAGNOSIS — I2510 Atherosclerotic heart disease of native coronary artery without angina pectoris: Secondary | ICD-10-CM

## 2011-07-21 DIAGNOSIS — K219 Gastro-esophageal reflux disease without esophagitis: Secondary | ICD-10-CM | POA: Insufficient documentation

## 2011-07-21 NOTE — Discharge Summary (Signed)
Physician Discharge Summary  Patient ID: Clayton Anderson MRN: [id number] DOB/AGE: 1946/07/23 65 y.o.  Admit date: 07/19/2011 Discharge date: 07/21/2011  Primary Discharge Diagnosis: 1. Dyspnea and chest pain of undetermined etiology-myocardial infarction ruled out  Secondary Discharge Diagnosis: 2. Coronary artery disease with previous stenting of the LAD in 2001 which was widely patent this admission. PCI of the obtuse marginal branch in 2001 which was patent this admission 3. Hypertension 4. Hyperlipidemia under treatment 5. Cervical disc disease 6. 5 mm pulmonary nodule and some mild mediastinal adenopathy with emphysema in need of followup on CT scan  Significant Diagnostic Studies:  Cardiac catheterization 2-D echocardiogram CT angiogram of chest  Hospital Course: The patient is a 65 year old male with previous stenting of the LAD with her metal stents as well as angioplasty of the circumflex in 2001. He presented with worsening dyspnea that would occur at rest and would be prolonged at times. He had some vague tightness associated with this and after a prolonged episode of dyspnea occurring at rest presented to the [facility] emergency room. Initial troponins were negative and EKG was unremarkable and he was admitted for evaluation of unstable angina pectoris.  His initial troponins and EKG were negative. Because of his suggestive history, it was felt that he should go directly to the cardiac catheterization laboratory for assessment of his coronary status. This was done the next day and showed a widely patent stent in the LAD. The previous angioplasty site of the marginal branch had a less than 30% stenosis noted. There was no significant disease involving the right coronary artery. An echocardiogram was to be done the day of discharge but was pending at the time of dictation.   The etiology of the dyspnea was undetermined. A CT scan showed borderline mediastinal adenopathy on the right,  mild centrilobular emphysema, and a small 5 mm nodule with followup recommended down the road. He remained in sinus rhythm on telemetry and was discharged in stable condition awaiting the results of the echocardiogram.  Discharge Exam: Blood pressure 137/75, pulse 82, temperature 97.6 F (36.4 C), temperature source Oral, resp. rate 18, height 5\' 9"  (1.753 m), weight 89.404 kg (197 lb 1.6 oz), SpO2 97.00%.   His catheterization site is clean and dry without hematoma. Lungs were clear. Cardiac exam was normal.  Labs: CBC:   Lab Results  Component Value Date   WBC 6.6 07/20/2011   HGB 14.7 07/20/2011   HCT 42.7 07/20/2011   MCV 87.3 07/20/2011   PLT 183 07/20/2011   CMP:  Lab 07/20/11 1146  NA 138  K 4.1  CL 104  CO2 24  BUN 20  CREATININE 0.92  CALCIUM 8.9  PROT 7.2  BILITOT 0.5  ALKPHOS 61  ALT 16  AST 17  GLUCOSE 96   Cardiac Enzymes:  Basename 07/20/11 1846 07/20/11 1146 07/20/11 1013  CKTOTAL 39 45 41  CKMB 2.3 2.3 2.2  CKMBINDEX -- -- --  TROPONINI <0.30 <0.30 <0.30     Radiology: Findings: The heart size and pulmonary vascularity are normal. The lungs appear clear and expanded without focal air space disease or consolidation. No blunting of the costophrenic angles. No pneumothorax. Bilateral cervical ribs. Postoperative changes in the lower cervical spine. Degenerative changes in the spine. Stable appearance since previous study.   IMPRESSION:  No evidence of active pulmonary disease.  EKG:  Normal  Discharge Medications:  Erle, Guster  Home Medication Instructions [id number]   Printed on:07/21/11 0919  Medication  Information                    ramipril (ALTACE) 10 MG capsule Take 10 mg by mouth daily.             dexlansoprazole (DEXILANT) 60 MG capsule Take 60 mg by mouth daily.             amLODipine (NORVASC) 5 MG tablet Take 5 mg by mouth daily.             folic acid (FOLVITE) 800 MCG tablet Take 800 mcg by mouth daily.               rOPINIRole (REQUIP) 2 MG tablet Take 2 mg by mouth at bedtime.             rosuvastatin (CRESTOR) 40 MG tablet Take 40 mg by mouth daily.             aspirin EC 81 MG tablet Take 81 mg by mouth daily.             cyclobenzaprine (FLEXERIL) 10 MG tablet Take 10 mg by mouth 3 (three) times daily as needed. For muscle spasms            oxyCODONE-acetaminophen (PERCOCET) 10-325 MG per tablet Take 1 tablet by mouth every 6 (six) hours as needed. For pain.              Followup plans and appointments:  Follow up Dr. Donnie Aho in one week. May advance activity. Followup with Dr. Larwance Sachs in one to 2 weeks. He will need to have a followup CT scan done in 3-6 months.    Signed: Darden Palmer. MD [facility] 07/21/2011, 9:19 AM

## 2011-07-21 NOTE — Progress Notes (Signed)
  Echocardiogram Echocardiogram Transesophageal has been performed.  Clayton Anderson 07/21/2011, 3:31 PM

## 2011-08-17 ENCOUNTER — Ambulatory Visit (INDEPENDENT_AMBULATORY_CARE_PROVIDER_SITE_OTHER): Payer: 59 | Admitting: Pulmonary Disease

## 2011-08-17 ENCOUNTER — Encounter: Payer: Self-pay | Admitting: Pulmonary Disease

## 2011-08-17 VITALS — BP 142/82 | HR 64 | Temp 98.3°F | Ht 69.0 in | Wt 209.4 lb

## 2011-08-17 DIAGNOSIS — R0989 Other specified symptoms and signs involving the circulatory and respiratory systems: Secondary | ICD-10-CM

## 2011-08-17 DIAGNOSIS — R911 Solitary pulmonary nodule: Secondary | ICD-10-CM

## 2011-08-17 DIAGNOSIS — R0600 Dyspnea, unspecified: Secondary | ICD-10-CM | POA: Insufficient documentation

## 2011-08-17 NOTE — Assessment & Plan Note (Signed)
The patient is describing episodic dyspnea that does not occur with exertional activities, and has been improved with the addition of Lexapro to his medical regimen.  This may simply be an anxiety reaction, but would also consider whether it could be related to air trapping from COPD or possibly obstructive sleep apnea (but occurs during waking hours as well).  I see no evidence by exam or x-ray to suggest a weakened or paralyzed hemidiaphragm after his cervical spine surgery.  I would like to do full pulmonary function studies, and if these are normal, would give him more time on the Lexapro to see if his symptoms have resolved.  Patient is agreeable to this approach.

## 2011-08-17 NOTE — Assessment & Plan Note (Signed)
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

## 2011-08-17 NOTE — Progress Notes (Signed)
  Subjective:    Patient ID: Clayton Anderson, male    DOB: 03-22-1946, 66 y.o.   MRN: [id number]  HPI The pt is a 65y/o male who I have been asked to see for episodic dyspnea.  The pt states since his cervical spine surgery 3 yrs ago, he has had intermittant issues with a sensation of sob.  They were initially rare, but they are becoming more frequent.  He has had 4 episodes since Nov of last year until now.  These do NOT occur during physical activity, and primarily occur in am's when awakened from sleep near dawn or during day when sitting doing nothing.  He is suddenly overcome with a sense of not being able to breathe, and denies any issue taking a deep breath.  He states he can walk miles on flat ground without getting winded, and denies sob bringing groceries inside or walking up stairs.  He has been put on lexapro by his primary md for possible anxiety, and has not had a recurrence of his symptoms since.  He has had an extensive cardiac w/u with nothing being found.  Ct chest with mild emphysematous changes, no PE, and a 5 mm nodule in RUL.  He has a long h/o tobacco abuse, but has not had pfts in many years.  He denies any significant cough.  He has no other hx suggestive of osa.    Review of Systems  Constitutional: Negative for fever and unexpected weight change.  HENT: Positive for congestion and trouble swallowing. Negative for ear pain, nosebleeds, sore throat, rhinorrhea, sneezing, dental problem, postnasal drip and sinus pressure.   Eyes: Negative for redness and itching.  Respiratory: Positive for cough and shortness of breath. Negative for chest tightness and wheezing.   Cardiovascular: Negative for palpitations and leg swelling.  Gastrointestinal: Negative for nausea and vomiting.  Genitourinary: Negative for dysuria.  Musculoskeletal: Positive for joint swelling.  Skin: Negative for rash.  Neurological: Negative for headaches.  Hematological: Does not bruise/bleed easily.    Psychiatric/Behavioral: Positive for dysphoric mood. The patient is nervous/anxious.        Objective:   Physical Exam Constitutional:  Well developed, no acute distress  HENT:  Nares patent without discharge, but narrowed bilat.   Oropharynx without exudate, palate and uvula are mildly elongated.  Eyes:  Perrla, eomi, no scleral icterus  Neck:  No JVD, no TMG  Cardiovascular:  Normal rate, regular rhythm, no rubs or gallops.  No murmurs        Intact distal pulses  Pulmonary :  Normal breath sounds, no stridor or respiratory distress   No rales, rhonchi, or wheezing  Abdominal:  Soft, nondistended, bowel sounds present.  No tenderness noted.   Musculoskeletal:  No lower extremity edema noted.  Lymph Nodes:  No cervical lymphadenopathy noted  Skin:  No cyanosis noted  Neurologic:  Alert, appropriate, moves all 4 extremities without obvious deficit.         Assessment & Plan:

## 2011-08-17 NOTE — Patient Instructions (Signed)
Will set up for breathing studies, and see you back on the same day for review. Would recommend a followup ct chest in 6mos given your tobacco use history and asbestos exposure history.

## 2011-09-04 ENCOUNTER — Encounter: Payer: Self-pay | Admitting: Pulmonary Disease

## 2011-09-04 ENCOUNTER — Ambulatory Visit (INDEPENDENT_AMBULATORY_CARE_PROVIDER_SITE_OTHER): Payer: 59 | Admitting: Pulmonary Disease

## 2011-09-04 DIAGNOSIS — R911 Solitary pulmonary nodule: Secondary | ICD-10-CM

## 2011-09-04 DIAGNOSIS — J984 Other disorders of lung: Secondary | ICD-10-CM

## 2011-09-04 DIAGNOSIS — R0989 Other specified symptoms and signs involving the circulatory and respiratory systems: Secondary | ICD-10-CM

## 2011-09-04 DIAGNOSIS — R0600 Dyspnea, unspecified: Secondary | ICD-10-CM

## 2011-09-04 DIAGNOSIS — R0609 Other forms of dyspnea: Secondary | ICD-10-CM

## 2011-09-04 LAB — PULMONARY FUNCTION TEST

## 2011-09-04 NOTE — Patient Instructions (Signed)
Will give you 6-8 weeks longer on lexapro to see if your breathing episodes resolve.  If they do not, you need to call me so we can start on an inhaler for mild copd. Will set up for ct chest in 6mos to f/u your "spot", and will call with results.

## 2011-09-04 NOTE — Progress Notes (Signed)
PFT done today. 

## 2011-09-04 NOTE — Progress Notes (Signed)
  Subjective:    Patient ID: Clayton Anderson, male    DOB: 28-Jul-1946, 66 y.o.   MRN: [id number]  HPI Patient comes in today for followup of his pulmonary function studies.  These were done as part of a workup for dyspnea on exertion that is episodic in nature.  His PFTs showed very mild airflow obstruction, no restriction, and a normal diffusion capacity.  I have reviewed these in detail with the patient, and answered all of his questions.  He continues to believe that his breathing has gotten better since being on Lexapro.  He has an upcoming appointment with his primary physician to discuss increasing the dose.   Review of Systems  Constitutional: Negative for fever and unexpected weight change.  HENT: Positive for postnasal drip. Negative for ear pain, nosebleeds, congestion, sore throat, rhinorrhea, sneezing, trouble swallowing, dental problem and sinus pressure.   Eyes: Negative for redness and itching.  Respiratory: Negative for cough, chest tightness, shortness of breath and wheezing.   Cardiovascular: Negative for palpitations and leg swelling.  Gastrointestinal: Negative for nausea and vomiting.  Genitourinary: Negative for dysuria.  Musculoskeletal: Negative for joint swelling.  Skin: Negative for rash.  Neurological: Negative for headaches.  Hematological: Does not bruise/bleed easily.  Psychiatric/Behavioral: Negative for dysphoric mood. The patient is not nervous/anxious.        Objective:   Physical Exam Well-developed male in no acute distress Nose without purulence or discharge noted Chest is clear Lower extremities without edema, no cyanosis noted Alert and oriented, moves all 4 extremities.       Assessment & Plan:

## 2011-09-04 NOTE — Assessment & Plan Note (Signed)
The patient has very mild airflow of distraction by his pulmonary function studies today.  Based on his history of smoking, I suspect this is related to mild emphysema.  However, given the episodic nature of his symptoms, I would wonder if this could represent occult asthma.  The patient is convinced that Lexapro is helping his breathing, and therefore would hold off on starting any kind of pulmonary medication until we can see if this is going to resolve his symptoms.  If it does not, I will start him on a bronchodilator regimen.

## 2012-02-15 ENCOUNTER — Ambulatory Visit (INDEPENDENT_AMBULATORY_CARE_PROVIDER_SITE_OTHER)
Admission: RE | Admit: 2012-02-15 | Discharge: 2012-02-15 | Disposition: A | Discharge status: Home / Self Care | Payer: 59 | Source: Ambulatory Visit | Attending: Pulmonary Disease | Admitting: Pulmonary Disease

## 2012-02-15 DIAGNOSIS — R911 Solitary pulmonary nodule: Secondary | ICD-10-CM

## 2012-02-19 ENCOUNTER — Telehealth: Payer: Self-pay | Admitting: Pulmonary Disease

## 2012-02-19 DIAGNOSIS — R911 Solitary pulmonary nodule: Secondary | ICD-10-CM

## 2012-02-19 NOTE — Telephone Encounter (Signed)
Notes Recorded by Barbaraann Share, MD on 02/16/2012 at 8:53 PM Clayton Anderson, please let pt know that his spot that we were monitoring is unchanged. That is good news. However, he has a few new areas that were not present in 07/2012 and are very small as well. The radiologist thinks they may be scarring, but has recommended a followup scan in 3 mos. If he is ok with this, please send an order to [facility] for a NONCONTRasted ct chest for middle to end of October to f/u abnormal findings. Thanks   Pt aware and order has been placed for repeat chest ct in mid Oct 2013.  Patient did want KC to know that he has noticed some slight increase in DOE and wants to know if an inhaler would be beneficial for him. Pt states they had discussed this briefly at the last OV. He states he is in no distress and will wait for [facility] recs once he returns to the office on 02/25/12. KC, pls advise.

## 2012-02-23 ENCOUNTER — Other Ambulatory Visit: Payer: Self-pay | Admitting: Cardiology
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
## 2012-02-26 NOTE — Telephone Encounter (Signed)
Pt aware he can come by to pick up samples and will ask for [facility] for inhaler instruct. Pt says he will come by first of the week.

## 2012-02-26 NOTE — Telephone Encounter (Signed)
We can try spiriva one inhalation each am.  Can come by for 3 boxes, and get shown how to use inhaler.   To call me in 4 weeks to give update how things are going.

## 2012-02-26 NOTE — Telephone Encounter (Signed)
LMOMTCB x 1 for pt. Samples have been left up front for the pt to pick up. He will need to be shown how to use the inhaler properly.

## 2012-03-29 ENCOUNTER — Telehealth: Payer: Self-pay | Admitting: Pulmonary Disease

## 2012-03-29 NOTE — Telephone Encounter (Signed)
Spoke with pt and notified of recs per [facility]. He verbalized understanding and denies any questions. Will try off med and call back with update.

## 2012-03-29 NOTE — Telephone Encounter (Signed)
LMOMTCB x 1 

## 2012-03-29 NOTE — Telephone Encounter (Signed)
I spoke with pt and he stated he can't tell if the spiriva has helped with his breathing. He stated he does seem he can breathe a little deeper and longer but only when he uses the inhaler. He is wanting to know should he stop this to see if he worsens or continue this or try a different medication. Please advise Dr. Shelle Iron thanks

## 2012-03-29 NOTE — Telephone Encounter (Signed)
Have him stop the inhaler and see if he notices a difference.  Let us know if he sees a difference.

## 2012-05-18 ENCOUNTER — Ambulatory Visit (INDEPENDENT_AMBULATORY_CARE_PROVIDER_SITE_OTHER)
Admission: RE | Admit: 2012-05-18 | Discharge: 2012-05-18 | Disposition: A | Discharge status: Home / Self Care | Payer: 59 | Source: Ambulatory Visit | Attending: Pulmonary Disease | Admitting: Pulmonary Disease

## 2012-05-18 DIAGNOSIS — R911 Solitary pulmonary nodule: Secondary | ICD-10-CM

## 2012-05-19 ENCOUNTER — Telehealth: Payer: Self-pay | Admitting: Pulmonary Disease

## 2012-05-19 DIAGNOSIS — R911 Solitary pulmonary nodule: Secondary | ICD-10-CM

## 2012-05-19 MED ORDER — TIOTROPIUM BROMIDE MONOHYDRATE 18 MCG IN CAPS: 18.0000 ug | ORAL_CAPSULE | Freq: Every day | RESPIRATORY_TRACT | Status: DC

## 2012-05-19 NOTE — Telephone Encounter (Signed)
I spoke with the pt regarding his chest ct results and the pt wanted to see if he can restart Spiriva. He has been off of this medication for a few months now but  believes he does benefit from it when on it. He has noticed some worsening with his breathing when exerting himself. Please advise.

## 2012-05-19 NOTE — Telephone Encounter (Signed)
Ok with me 

## 2012-05-19 NOTE — Telephone Encounter (Signed)
Pt aware. I have sent rx into the pharmacy per pt request.

## 2012-11-11 ENCOUNTER — Ambulatory Visit (INDEPENDENT_AMBULATORY_CARE_PROVIDER_SITE_OTHER)
Admission: RE | Admit: 2012-11-11 | Discharge: 2012-11-11 | Disposition: A | Discharge status: Home / Self Care | Payer: Medicare Other | Source: Ambulatory Visit | Attending: Pulmonary Disease | Admitting: Pulmonary Disease

## 2012-11-11 DIAGNOSIS — R911 Solitary pulmonary nodule: Secondary | ICD-10-CM

## 2012-11-17 ENCOUNTER — Other Ambulatory Visit: Payer: Self-pay | Admitting: Pulmonary Disease

## 2012-11-17 DIAGNOSIS — R911 Solitary pulmonary nodule: Secondary | ICD-10-CM

## 2012-11-18 NOTE — Pre-Procedure Instructions (Addendum)
Clayton Anderson  11/18/2012   Your procedure is scheduled on: Thursday, December 01, 2012   Report to [facility] at  8:00 AM.             (Take [employer] Elevators to the 3rd floor)  Call this number if you have problems the morning of surgery: [phone]   Remember:   Do not eat food or drink liquids after midnight Wednesday.   Take these medicines the morning of surgery with A SIP OF WATER: Norvasc, Lexapro, Oxycodone,              Spiriva               Do not wear jewelry.  Do not wear lotions, powders, or colognes. You may  NOT wear deodorant.             Men may shave face and neck.   Do not bring valuables to the hospital.  Contacts, dentures or bridgework may not be worn into surgery.   Leave suitcase in the car. After surgery it may be brought to your room.  For patients admitted to the hospital, checkout time is 11:00 AM the day of discharge.   Patients discharged the day of surgery will not be allowed to drive home, and will need              Someone to stay with them for the first 24 hrs after surgery.   Name and phone number of your driver:    Special Instructions: Shower using CHG 2 nights before surgery and the night before surgery.  If you shower the day of surgery use CHG.  Use special wash - you have one bottle of CHG for all showers.  You should use approximately 1/3 of the bottle for each shower.   Please read over the following fact sheets that you were given: Pain Booklet, Coughing and Deep Breathing and Surgical Site Infection Prevention

## 2012-11-21 ENCOUNTER — Encounter (HOSPITAL_COMMUNITY)
Admission: RE | Admit: 2012-11-21 | Discharge: 2012-11-21 | Disposition: A | Discharge status: Home / Self Care | Payer: Medicare Other | Source: Ambulatory Visit | Attending: Orthopedic Surgery | Admitting: Orthopedic Surgery

## 2012-11-21 ENCOUNTER — Encounter (HOSPITAL_COMMUNITY): Payer: Self-pay | Admitting: Respiratory Therapy

## 2012-11-21 ENCOUNTER — Encounter (HOSPITAL_COMMUNITY): Payer: Self-pay

## 2012-11-21 DIAGNOSIS — Z01812 Encounter for preprocedural laboratory examination: Secondary | ICD-10-CM | POA: Insufficient documentation

## 2012-11-21 HISTORY — DX: Pure hypercholesterolemia, unspecified: E78.00

## 2012-11-21 LAB — COMPREHENSIVE METABOLIC PANEL
AST: 21 U/L (ref 0–37)
Albumin: 3.8 g/dL (ref 3.5–5.2)
BUN: 14 mg/dL (ref 6–23)
Calcium: 9 mg/dL (ref 8.4–10.5)
Creatinine, Ser: 0.98 mg/dL (ref 0.50–1.35)
Total Bilirubin: 0.9 mg/dL (ref 0.3–1.2)
Total Protein: 6.9 g/dL (ref 6.0–8.3)

## 2012-11-21 LAB — CBC
HCT: 36.4 % — ABNORMAL LOW (ref 39.0–52.0)
MCH: 29.3 pg (ref 26.0–34.0)
MCHC: 34.9 g/dL (ref 30.0–36.0)
MCV: 84.1 fL (ref 78.0–100.0)
Platelets: 210 10*3/uL (ref 150–400)
RDW: 12.4 % (ref 11.5–15.5)
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
## 2012-11-30 MED ORDER — CEFAZOLIN SODIUM-DEXTROSE 2-3 GM-% IV SOLR
2.0000 g | INTRAVENOUS | Status: AC
  Administered 2012-12-01: 2 g via INTRAVENOUS
  Filled 2012-11-30: qty 50

## 2012-11-30 NOTE — Progress Notes (Signed)
Notified patient of time change and instructed him to arrive at 1030 am 12/01/12.

## 2012-12-01 ENCOUNTER — Encounter (HOSPITAL_COMMUNITY): Payer: Self-pay | Admitting: Vascular Surgery

## 2012-12-01 ENCOUNTER — Encounter (HOSPITAL_COMMUNITY): Payer: Self-pay | Admitting: *Deleted

## 2012-12-01 ENCOUNTER — Ambulatory Visit (HOSPITAL_COMMUNITY)
Admission: RE | Admit: 2012-12-01 | Discharge: 2012-12-01 | Disposition: A | Discharge status: Home / Self Care | Payer: Medicare Other | Source: Ambulatory Visit | Attending: Orthopedic Surgery | Admitting: Orthopedic Surgery

## 2012-12-01 ENCOUNTER — Ambulatory Visit (HOSPITAL_COMMUNITY): Payer: Medicare Other | Admitting: Anesthesiology

## 2012-12-01 ENCOUNTER — Encounter (HOSPITAL_COMMUNITY)
Admission: RE | Disposition: A | Discharge status: Home / Self Care | Payer: Self-pay | Source: Ambulatory Visit | Attending: Orthopedic Surgery

## 2012-12-01 DIAGNOSIS — M1990 Unspecified osteoarthritis, unspecified site: Secondary | ICD-10-CM | POA: Insufficient documentation

## 2012-12-01 DIAGNOSIS — M11819 Other specified crystal arthropathies, unspecified shoulder: Secondary | ICD-10-CM | POA: Insufficient documentation

## 2012-12-01 DIAGNOSIS — I252 Old myocardial infarction: Secondary | ICD-10-CM | POA: Insufficient documentation

## 2012-12-01 DIAGNOSIS — K219 Gastro-esophageal reflux disease without esophagitis: Secondary | ICD-10-CM | POA: Insufficient documentation

## 2012-12-01 DIAGNOSIS — M7500 Adhesive capsulitis of unspecified shoulder: Secondary | ICD-10-CM | POA: Insufficient documentation

## 2012-12-01 DIAGNOSIS — Z887 Allergy status to serum and vaccine status: Secondary | ICD-10-CM | POA: Insufficient documentation

## 2012-12-01 DIAGNOSIS — E7800 Pure hypercholesterolemia, unspecified: Secondary | ICD-10-CM | POA: Insufficient documentation

## 2012-12-01 DIAGNOSIS — I2510 Atherosclerotic heart disease of native coronary artery without angina pectoris: Secondary | ICD-10-CM | POA: Insufficient documentation

## 2012-12-01 DIAGNOSIS — Z79899 Other long term (current) drug therapy: Secondary | ICD-10-CM | POA: Insufficient documentation

## 2012-12-01 DIAGNOSIS — M6740 Ganglion, unspecified site: Secondary | ICD-10-CM | POA: Insufficient documentation

## 2012-12-01 DIAGNOSIS — I10 Essential (primary) hypertension: Secondary | ICD-10-CM | POA: Insufficient documentation

## 2012-12-01 DIAGNOSIS — F3289 Other specified depressive episodes: Secondary | ICD-10-CM | POA: Insufficient documentation

## 2012-12-01 DIAGNOSIS — Z882 Allergy status to sulfonamides status: Secondary | ICD-10-CM | POA: Insufficient documentation

## 2012-12-01 DIAGNOSIS — M67919 Unspecified disorder of synovium and tendon, unspecified shoulder: Secondary | ICD-10-CM | POA: Insufficient documentation

## 2012-12-01 DIAGNOSIS — Z7982 Long term (current) use of aspirin: Secondary | ICD-10-CM | POA: Insufficient documentation

## 2012-12-01 DIAGNOSIS — M719 Bursopathy, unspecified: Secondary | ICD-10-CM | POA: Insufficient documentation

## 2012-12-01 DIAGNOSIS — F411 Generalized anxiety disorder: Secondary | ICD-10-CM | POA: Insufficient documentation

## 2012-12-01 DIAGNOSIS — G709 Myoneural disorder, unspecified: Secondary | ICD-10-CM | POA: Insufficient documentation

## 2012-12-01 DIAGNOSIS — F329 Major depressive disorder, single episode, unspecified: Secondary | ICD-10-CM | POA: Insufficient documentation

## 2012-12-01 DIAGNOSIS — Z87891 Personal history of nicotine dependence: Secondary | ICD-10-CM | POA: Insufficient documentation

## 2012-12-01 HISTORY — PX: SHOULDER ARTHROSCOPY WITH LABRAL REPAIR: SHX5691

## 2012-12-01 SURGERY — ARTHROSCOPY, SHOULDER, WITH GLENOID LABRUM REPAIR
Anesthesia: Regional | Site: Shoulder | Laterality: Left | Wound class: Clean

## 2012-12-01 MED ORDER — HYDROMORPHONE HCL 4 MG PO TABS: 4.0000 mg | ORAL_TABLET | ORAL | Status: DC | PRN

## 2012-12-01 MED ORDER — FENTANYL CITRATE 0.05 MG/ML IJ SOLN
INTRAMUSCULAR | Status: AC
  Administered 2012-12-01: 100 ug
  Filled 2012-12-01: qty 2

## 2012-12-01 MED ORDER — ONDANSETRON HCL 4 MG/2ML IJ SOLN
INTRAMUSCULAR | Status: DC | PRN
  Administered 2012-12-01: 4 mg via INTRAVENOUS

## 2012-12-01 MED ORDER — GLYCOPYRROLATE 0.2 MG/ML IJ SOLN
INTRAMUSCULAR | Status: DC | PRN
  Administered 2012-12-01: 0.2 mg via INTRAVENOUS

## 2012-12-01 MED ORDER — OXYCODONE HCL 5 MG PO TABS: 5.0000 mg | ORAL_TABLET | Freq: Once | ORAL | Status: DC | PRN

## 2012-12-01 MED ORDER — FENTANYL CITRATE 0.05 MG/ML IJ SOLN: 50.0000 ug | INTRAMUSCULAR | Status: DC | PRN

## 2012-12-01 MED ORDER — MIDAZOLAM HCL 2 MG/2ML IJ SOLN
INTRAMUSCULAR | Status: AC
  Filled 2012-12-01: qty 2

## 2012-12-01 MED ORDER — LACTATED RINGERS IV SOLN
INTRAVENOUS | Status: DC
  Administered 2012-12-01: 12:00:00 via INTRAVENOUS

## 2012-12-01 MED ORDER — NEOSTIGMINE METHYLSULFATE 1 MG/ML IJ SOLN
INTRAMUSCULAR | Status: DC | PRN
  Administered 2012-12-01: 2 mg via INTRAVENOUS

## 2012-12-01 MED ORDER — SODIUM CHLORIDE 0.9 % IR SOLN
Status: DC | PRN
  Administered 2012-12-01: 6000 mL

## 2012-12-01 MED ORDER — LACTATED RINGERS IV SOLN
INTRAVENOUS | Status: DC | PRN
  Administered 2012-12-01 (×2): via INTRAVENOUS

## 2012-12-01 MED ORDER — OXYCODONE HCL 5 MG/5ML PO SOLN: 5.0000 mg | Freq: Once | ORAL | Status: DC | PRN

## 2012-12-01 MED ORDER — PHENYLEPHRINE HCL 10 MG/ML IJ SOLN
10.0000 mg | INTRAVENOUS | Status: DC | PRN
  Administered 2012-12-01: 25 ug/min via INTRAVENOUS

## 2012-12-01 MED ORDER — ROCURONIUM BROMIDE 100 MG/10ML IV SOLN
INTRAVENOUS | Status: DC | PRN
  Administered 2012-12-01: 50 mg via INTRAVENOUS

## 2012-12-01 MED ORDER — ONDANSETRON HCL 4 MG/2ML IJ SOLN: 4.0000 mg | Freq: Four times a day (QID) | INTRAMUSCULAR | Status: DC | PRN

## 2012-12-01 MED ORDER — MIDAZOLAM HCL 5 MG/5ML IJ SOLN
INTRAMUSCULAR | Status: DC | PRN
  Administered 2012-12-01: 1 mg via INTRAVENOUS

## 2012-12-01 MED ORDER — FENTANYL CITRATE 0.05 MG/ML IJ SOLN
INTRAMUSCULAR | Status: DC | PRN
  Administered 2012-12-01 (×2): 50 ug via INTRAVENOUS

## 2012-12-01 MED ORDER — HYDROMORPHONE HCL PF 1 MG/ML IJ SOLN: 0.2500 mg | INTRAMUSCULAR | Status: DC | PRN

## 2012-12-01 MED ORDER — CHLORHEXIDINE GLUCONATE 4 % EX LIQD: 60.0000 mL | Freq: Once | CUTANEOUS | Status: DC

## 2012-12-01 MED ORDER — PROPOFOL 10 MG/ML IV BOLUS
INTRAVENOUS | Status: DC | PRN
  Administered 2012-12-01: 200 mg via INTRAVENOUS

## 2012-12-01 MED ORDER — EPHEDRINE SULFATE 50 MG/ML IJ SOLN
INTRAMUSCULAR | Status: DC | PRN
  Administered 2012-12-01 (×4): 10 mg via INTRAVENOUS

## 2012-12-01 MED ORDER — MIDAZOLAM HCL 2 MG/2ML IJ SOLN
1.0000 mg | INTRAMUSCULAR | Status: DC | PRN
  Administered 2012-12-01: 2 mg via INTRAVENOUS

## 2012-12-01 MED ORDER — CYCLOBENZAPRINE HCL 10 MG PO TABS: 10.0000 mg | ORAL_TABLET | Freq: Three times a day (TID) | ORAL | Status: DC | PRN

## 2012-12-01 MED ORDER — LACTATED RINGERS IV SOLN: INTRAVENOUS | Status: DC

## 2012-12-01 SURGICAL SUPPLY — 62 items
BLADE CUTTER GATOR 3.5 (BLADE) ×2 IMPLANT
BLADE GREAT WHITE 4.2 (BLADE) ×2 IMPLANT
BLADE SURG 11 STRL SS (BLADE) ×2 IMPLANT
BOOTCOVER CLEANROOM LRG (PROTECTIVE WEAR) ×4 IMPLANT
BUR OVAL 4.0 (BURR) ×2 IMPLANT
CANISTER SUCT LVC 12 LTR MEDI- (MISCELLANEOUS) ×2 IMPLANT
CANNULA ACUFLEX KIT 5X76 (CANNULA) ×3 IMPLANT
CANNULA DRILOCK 5.0X75 (CANNULA) ×2 IMPLANT
CATH URET WHISTLE 8FR 331008 (CATHETERS) ×1 IMPLANT
CLOTH BEACON ORANGE TIMEOUT ST (SAFETY) ×2 IMPLANT
CONNECTOR 5 IN 1 STRAIGHT STRL (MISCELLANEOUS) IMPLANT
DRAPE INCISE 23X17 IOBAN STRL (DRAPES) ×1
DRAPE INCISE 23X17 STRL (DRAPES) ×1 IMPLANT
DRAPE INCISE IOBAN 23X17 STRL (DRAPES) ×1 IMPLANT
DRAPE STERI 35X30 U-POUCH (DRAPES) ×2 IMPLANT
DRAPE SURG 17X11 SM STRL (DRAPES) ×2 IMPLANT
DRAPE U-SHAPE 47X51 STRL (DRAPES) ×2 IMPLANT
DRSG PAD ABDOMINAL 8X10 ST (GAUZE/BANDAGES/DRESSINGS) ×3 IMPLANT
DURAPREP 26ML APPLICATOR (WOUND CARE) ×2 IMPLANT
ELECT REM PT RETURN 9FT ADLT (ELECTROSURGICAL) ×2
ELECTRODE REM PT RTRN 9FT ADLT (ELECTROSURGICAL) ×1 IMPLANT
GLOVE BIO SURGEON STRL SZ7.5 (GLOVE) ×2 IMPLANT
GLOVE BIO SURGEON STRL SZ8 (GLOVE) ×2 IMPLANT
GLOVE EUDERMIC 7 POWDERFREE (GLOVE) ×2 IMPLANT
GLOVE SS BIOGEL STRL SZ 7.5 (GLOVE) ×1 IMPLANT
GLOVE SUPERSENSE BIOGEL SZ 7.5 (GLOVE) ×1
GOWN STRL NON-REIN LRG LVL3 (GOWN DISPOSABLE) ×2 IMPLANT
GOWN STRL REIN XL XLG (GOWN DISPOSABLE) ×4 IMPLANT
KIT BASIN OR (CUSTOM PROCEDURE TRAY) ×2 IMPLANT
KIT ROOM TURNOVER OR (KITS) ×2 IMPLANT
KIT SHOULDER TRACTION (DRAPES) ×2 IMPLANT
MANIFOLD NEPTUNE II (INSTRUMENTS) ×2 IMPLANT
NDL SPNL 18GX3.5 QUINCKE PK (NEEDLE) ×1 IMPLANT
NDL SUT 6 .5 CRC .975X.05 MAYO (NEEDLE) IMPLANT
NEEDLE MAYO TAPER (NEEDLE)
NEEDLE SPNL 18GX3.5 QUINCKE PK (NEEDLE) ×2 IMPLANT
NS IRRIG 1000ML POUR BTL (IV SOLUTION) ×2 IMPLANT
PACK SHOULDER (CUSTOM PROCEDURE TRAY) ×2 IMPLANT
PAD ARMBOARD 7.5X6 YLW CONV (MISCELLANEOUS) ×4 IMPLANT
SET ARTHROSCOPY TUBING (MISCELLANEOUS) ×2
SET ARTHROSCOPY TUBING LN (MISCELLANEOUS) ×1 IMPLANT
SLING ARM FOAM STRAP LRG (SOFTGOODS) ×1 IMPLANT
SLING ARM IMMOBILIZER LRG (SOFTGOODS) ×2 IMPLANT
SLING ARM IMMOBILIZER MED (SOFTGOODS) IMPLANT
SPONGE GAUZE 4X4 12PLY (GAUZE/BANDAGES/DRESSINGS) ×2 IMPLANT
SPONGE LAP 4X18 X RAY DECT (DISPOSABLE) ×2 IMPLANT
STRIP CLOSURE SKIN 1/2X4 (GAUZE/BANDAGES/DRESSINGS) ×2 IMPLANT
SUT MNCRL AB 3-0 PS2 18 (SUTURE) ×2 IMPLANT
SUT PDS AB 1 CT  36 (SUTURE)
SUT PDS AB 1 CT 36 (SUTURE) IMPLANT
SUT RETRIEVER GRASP 30 DEG (SUTURE) ×1 IMPLANT
SUT VIC AB 1 CT1 27 (SUTURE) ×2
SUT VIC AB 1 CT1 27XBRD ANBCTR (SUTURE) IMPLANT
SUT VIC AB 2-0 CT1 27 (SUTURE) ×2
SUT VIC AB 2-0 CT1 TAPERPNT 27 (SUTURE) IMPLANT
SYR 20CC LL (SYRINGE) ×2 IMPLANT
TAPE CLOTH SURG 6X10 WHT LF (GAUZE/BANDAGES/DRESSINGS) ×1 IMPLANT
TAPE PAPER 3X10 WHT MICROPORE (GAUZE/BANDAGES/DRESSINGS) ×2 IMPLANT
TOWEL OR 17X24 6PK STRL BLUE (TOWEL DISPOSABLE) ×2 IMPLANT
TOWEL OR 17X26 10 PK STRL BLUE (TOWEL DISPOSABLE) ×2 IMPLANT
WAND SUCTION MAX 4MM 90S (SURGICAL WAND) ×2 IMPLANT
WATER STERILE IRR 1000ML POUR (IV SOLUTION) ×2 IMPLANT

## 2012-12-01 NOTE — Anesthesia Preprocedure Evaluation (Addendum)
Anesthesia Evaluation  Patient identified by MRN, date of birth, ID band Patient awake    Reviewed: Allergy & Precautions, H&P , NPO status , Patient's Chart, lab work & pertinent test results  Airway Mallampati: II TM Distance: >3 FB Neck ROM: full    Dental  (+) Dental Advisory Given   Pulmonary shortness of breath and with exertion, former smoker,          Cardiovascular hypertension, Pt. on medications + CAD and + Past MI     Neuro/Psych  Headaches, PSYCHIATRIC DISORDERS Anxiety Depression  Neuromuscular disease    GI/Hepatic GERD-  Medicated and Controlled,  Endo/Other    Renal/GU      Musculoskeletal  (+) Arthritis -, Osteoarthritis,    Abdominal   Peds  Hematology   Anesthesia Other Findings   Reproductive/Obstetrics                          Anesthesia Physical Anesthesia Plan  ASA: III  Anesthesia Plan: General and Regional   Post-op Pain Management: MAC Combined w/ Regional for Post-op pain   Induction: Intravenous  Airway Management Planned: Oral ETT  Additional Equipment:   Intra-op Plan:   Post-operative Plan: Extubation in OR  Informed Consent: I have reviewed the patients History and Physical, chart, labs and discussed the procedure including the risks, benefits and alternatives for the proposed anesthesia with the patient or authorized representative who has indicated his/her understanding and acceptance.   Dental advisory given  Plan Discussed with: CRNA, Surgeon and Anesthesiologist  Anesthesia Plan Comments:        Anesthesia Quick Evaluation

## 2012-12-01 NOTE — Op Note (Signed)
12/01/2012  4:01 PM  PATIENT:   Vickey Huger Cordner  67 y.o. 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  SURGEON:  Senaida Lange M.D.  ASSISTANTS: Shuford pac   ANESTHESIA:   GET + ISB  EBL: 100cc  SPECIMEN:  none  Drains: none   PATIENT DISPOSITION:  PACU - hemodynamically stable.    PLAN OF CARE: Discharge to home after PACU  Dictation# [id number]

## 2012-12-01 NOTE — H&P (Signed)
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  Past Medical History  Diagnosis Date  . Hypertension   . Shortness of breath   . Depression   . Coronary artery disease   . GERD (gastroesophageal reflux disease)   . Arthritis   . Anxiety   . Neuromuscular disorder     hx of shingles/ myofacitis  . Cervical disc disease 07/20/2011  . Headache     years ago  . Myocardial infarct     2001  sees Dr. York Spaniel  . Elevated cholesterol     Past Surgical History  Procedure Laterality Date  . Shoulder surgery      twice  left shoulder  . Laminectomy and microdiscectomy cervical spine      January and August 2010  . Coronary angioplasty      x 2  last one 07/2011    History reviewed. No pertinent family history.  Social History:  reports that he quit smoking about 8 years ago. His smoking use included Cigarettes. He has a 36 pack-year smoking history. He has never used smokeless tobacco. He reports that  drinks alcohol. He reports that he does not use illicit drugs.  Allergies:  Allergies  Allergen Reactions  . Sulfa Antibiotics   . Tetanus Toxoids     Medications Prior to Admission  Medication Sig Dispense Refill  . amLODipine (NORVASC) 5 MG tablet Take 5 mg by mouth daily.        Marland Kitchen aspirin EC 81 MG tablet Take 81 mg by mouth daily.        Marland Kitchen atorvastatin (LIPITOR) 40 MG tablet Take 40 mg by mouth daily.      Marland Kitchen escitalopram (LEXAPRO) 10 MG tablet Take 15 mg by mouth daily.       Marland Kitchen esomeprazole (NEXIUM) 40 MG capsule Take 40 mg by mouth daily before breakfast.      . fentaNYL (DURAGESIC - DOSED MCG/HR) 75 MCG/HR Place 1 patch onto the skin every 3 (three) days.      . folic acid (FOLVITE) 800 MCG tablet Take 800 mcg by mouth daily.         Marland Kitchen oxyCODONE-acetaminophen (PERCOCET) 10-325 MG per tablet Take 1 tablet by mouth every 6 (six) hours as needed. For pain.       . ramipril (ALTACE) 10 MG capsule Take 10 mg by mouth daily.        Marland Kitchen rOPINIRole (REQUIP) 2 MG tablet Take 3 mg by mouth at bedtime.       Marland Kitchen tiotropium (SPIRIVA) 18 MCG inhalation capsule Place 1 capsule (18 mcg total) into inhaler and inhale daily.  30 capsule  6  . nitroGLYCERIN (NITROSTAT) 0.4 MG SL tablet Place 0.4 mg under the tongue every 5 (five) minutes as needed.           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

## 2012-12-01 NOTE — Preoperative (Signed)
Beta Blockers   Reason not to administer Beta Blockers:Not Applicable 
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
## 2012-12-01 NOTE — Anesthesia Postprocedure Evaluation (Signed)
Anesthesia Post Note  Patient: Clayton Anderson  Procedure(s) Performed: Procedure(s) (LRB): LEFT SHOULDER ARTHROSCOPY WITH ASPIRATION OF DEGENERATIVE OF THE PARALABRAL CYST and OPEN DECOMPRESSION OF SUPRASCAPULAR NERVE (Left)  Anesthesia type: general  Patient location: PACU  Post pain: Pain level controlled  Post assessment: Patient's Cardiovascular Status Stable  Last Vitals:  Filed Vitals:   12/01/12 1733  BP: 143/62  Pulse: 80  Temp: 36.6 C  Resp: 16    Post vital signs: Reviewed and stable  Level of consciousness: sedated  Complications: No apparent anesthesia complications

## 2012-12-01 NOTE — Anesthesia Procedure Notes (Addendum)
Procedure Name: Intubation Date/Time: 12/01/2012 1:32 PM Performed by: Rogelia Boga Pre-anesthesia Checklist: Patient identified, Emergency Drugs available, Suction available, Patient being monitored and Timeout performed Patient Re-evaluated:Patient Re-evaluated prior to inductionOxygen Delivery Method: Circle system utilized Preoxygenation: Pre-oxygenation with 100% oxygen Intubation Type: IV induction Ventilation: Mask ventilation without difficulty and Oral airway inserted - appropriate to patient size Tube type: Oral Tube size: 7.5 mm Number of attempts: 1 Airway Equipment and Method: Video-laryngoscopy and Stylet Placement Confirmation: ETT inserted through vocal cords under direct vision,  positive ETCO2 and breath sounds checked- equal and bilateral Secured at: 21 cm Tube secured with: Tape Dental Injury: Teeth and Oropharynx as per pre-operative assessment    Anesthesia Regional Block:  Interscalene brachial plexus block  Pre-Anesthetic Checklist: ,, timeout performed, Correct Patient, Correct Site, Correct Laterality, Correct Procedure, Correct Position, site marked, Risks and benefits discussed,  Surgical consent,  Pre-op evaluation,  At surgeon's request and post-op pain management  Laterality: Left  Prep: chloraprep       Needles:  Injection technique: Single-shot  Needle Type: Echogenic Stimulator Needle     Needle Length: 5cm 5 cm Needle Gauge: 22 and 22 G    Additional Needles:  Procedures: ultrasound guided (picture in chart) and nerve stimulator Interscalene brachial plexus block  Nerve Stimulator or Paresthesia:  Response: biceps flexion, 0.45 mA,   Additional Responses:   Narrative:  Start time: 12/01/2012 12:37 PM End time: 12/01/2012 12:51 PM Injection made incrementally with aspirations every 5 mL.  Performed by: Personally  Anesthesiologist: Dr Chaney Malling  Additional Notes: Functioning IV was confirmed and monitors were applied.  A 50mm  22ga Arrow echogenic stimulator needle was used. Sterile prep and drape,hand hygiene and sterile gloves were used.  Negative aspiration and negative test dose prior to incremental administration of local anesthetic. The patient tolerated the procedure well.  Ultrasound guidance: relevent anatomy identified, needle position confirmed, local anesthetic spread visualized around nerve(s), vascular puncture avoided.  Image printed for medical record.   Interscalene brachial plexus block

## 2012-12-01 NOTE — Transfer of Care (Signed)
Immediate Anesthesia Transfer of Care Note  Patient: Clayton Anderson  Procedure(s) Performed: Procedure(s): LEFT SHOULDER ARTHROSCOPY WITH ASPIRATION OF DEGENERATIVE OF THE PARALABRAL CYST and OPEN DECOMPRESSION OF SUPRASCAPULAR NERVE (Left)  Patient Location: PACU  Anesthesia Type:General and Regional  Level of Consciousness: awake, alert  and oriented  Airway & Oxygen Therapy: Patient Spontanous Breathing and Patient connected to nasal cannula oxygen  Post-op Assessment: Report given to PACU RN and Post -op Vital signs reviewed and stable  Post vital signs: Reviewed and stable  Complications: No apparent anesthesia complications

## 2012-12-02 ENCOUNTER — Encounter (HOSPITAL_COMMUNITY): Payer: Self-pay | Admitting: Orthopedic Surgery

## 2012-12-02 NOTE — Progress Notes (Signed)
Patient reports that pharmacy would not fill pain medication prescription d/t having percocet at home. He stated that were going to call MD. States that he wanted to save percocet for use after he was over the surgery. Requested that patient contact pharmacy and then contact Dr. Dub Mikes office for further concern.

## 2012-12-02 NOTE — Op Note (Signed)
NAME:  Clayton Anderson, Clayton Anderson NO.:  [id number]  MEDICAL RECORD NO.:  [id number]  LOCATION:  [facility]                         FACILITY:  [facility]  PHYSICIAN:  Vania Rea. Frenchie Pribyl, M.D.  DATE OF BIRTH:  1945-09-15  DATE OF PROCEDURE:  12/01/2012 DATE OF DISCHARGE:  12/01/2012                              OPERATIVE REPORT   PREOPERATIVE DIAGNOSES: 1. 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 2. Subacromial bursitis.  POSTOPERATIVE DIAGNOSES: 1. 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 2. Subacromial bursitis. 3. Chondrocalcinosis of the glenohumeral joint.  PROCEDURE: 1. Left shoulder examination under anesthesia. 2. Left shoulder diagnostic arthroscopy. 3. Chondroplasty of the humeral head and glenoid for treatment of     chondrocalcinosis. 4. Debridement of degenerative circumferential labral tear. 5. Arthroscopic decompression of a spinal glenoid notch cyst. 6. Arthroscopic subacromial decompression and bursectomy. 7. Open decompression of the suprascapular nerve at the suprascapular     notch with release of the transverse scapular ligament and the     evacuation of a large ganglion traversing the suprascapular notch.  SURGEON:  Vania Rea. Trentin Knappenberger, MD  ASSISTANT:  Lucita Lora. Shuford, PA-C  ANESTHESIA:  General endotracheal as well as an interscalene  block.  ESTIMATED BLOOD LOSS:  200 mL.  DRAINS:  None.  HISTORY:  [name] is a 67 year old gentleman, who has had chronic and progressive increased left shoulder pain with profound weakness of his supra and infraspinatus and a clinical exam showing changes consistent with neuropathy, suprascapular nerve and MRI scan showing a large paralabral ganglion extending through both the spinal glenoid and suprascapular notches.  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  He is brought to the operating room at this time for planned labral debridement, cyst decompression, exploration and decompression of the suprascapular nerve at both the suprascapular and spinal glenoid notch regions.  I preoperatively counseled Mr. Estupinan on treatment options as well as risks versus benefits throughout.  Possible surgical complications were reviewed including potential for bleeding, infection, neurovascular injury, persistent pain, loss of motion, anesthetic complication, possible need for additional surgery.  He understands and accepts and agrees with the planned procedure.  PROCEDURE IN DETAIL:  After undergoing routine preop evaluation, the patient received prophylactic antibiotics and an interscalene block was established in the holding area by the Anesthesia Department.  Placed supine on the operating table, underwent smooth induction  of a general endotracheal anesthesia.  Turned to the right lateral decubitus position on a beanbag and appropriately padded and protected.  Left shoulder examination under anesthesia confirmed full motion and no instability patterns were noted.  Left arm was then suspended at 70 degrees of abduction with 10 pounds of traction.  Left shoulder girdle region was sterilely prepped and draped in standard fashion.  A time-out was called.  Post lateral subsequent joint and portal established  under direct visualization.  There was evidence for some did diffuse chondrosis of the cleaning the joint with multiple punctate calcific deposits consistent with chondrocalcinosis.  This is particularly notable anteriorly.  Also found that he had a previous arthroscopic labral repair with some suture prominent anteriorly from a repair of the anterior labrum.  There was a frame portions a loop of the residual suture that we debrided.  Also performed a chondroplasty of the humeral head and glenoid.  Remove any punctate deposits of calcium.  There was degenerative tearing of the anterior as well as superior and posterior aspects of the labrum.  I did not however see any large displaced labral fragments insulin no instability patterns.  The labrum was debrided to a healthy debrided to healthy stable tissue.  Review of the MRI scan.  We did identify any area where there was fluid which appeared to have introduced traversing through the orifice that was external to the labrum posterior superiorly, and we opened this orifice and pass both a spinal needle as well as a shaver into this area and debrided the region of the paralabral ganglion all the way to the level of the spinal glenoid region nicely decompressing this entire area.  The biceps anchor was stable.  The rotator cuff was carefully inspected found to be intact.  Off.  Biceps was stable proximally and distally.  Normal caliber as well.  At this point, final inspection uric irrigation was then completed with an clean the glenohumeral joint.  Fluid was removed. The arm dropped down to 30 degrees abduction with the arthroscope introduced in the subacromial space with the posterior portal and a direct lateral portal established in the subacromial space.  Abundant prolific bursal tissue, multiple adhesions were encountered and these were all divided, excised with a combination of shaver and Stryker wand. The wand was used to remove the  periosteum from the undersurface of the anterior half of the acromion.  The subacromial depression was performed with a bur creating a type 1 morphology.  There had been a previously performed distal clavicle resection to confirm that this interval was appropriate dimensions and did remove reverse scar tissue in this region.  At this point, we obtained final hemostasis.  Fluid and instruments removed.  The arthroscopic portion of the case was then completed and we then proceeded with the open exposure of the suprascapular nerve, the suprascapular notch.  We began with a 6 cm incision traversely across the apex of the shoulder, approximately 6 cm medial to the posterolateral corner of the acromion and this traversed anterior to posterior from the anterior margin of the clavicle to the posterior margin of the scapular spine.  Dissection carried down through skin and subcu tissue and electrocautery was used for hemostasis.  Skin flaps were elevated.  Then mobilizing and visualizing the underlying trapezius, the trapezius was then dissected away from the scapular spine with electrocautery and carefully reflected forward anteriorly and retracted as a single layer allowing visualization of the underlying supraspinatus.  The supraspinatus was then  carefully dissected free and retracted posteriorly allowing visualization of the superior border of the scapula, base of the coracoid, and then dissection carried somewhat more medially to allow visualization of the suprascapular artery and this was carefully identified, mobilized, and retracted and we ultimately identified the transverse scapular ligament.  There was a bulging gelatinous mass noted anterior to the suprascapular notch and this had the consistency of a ganglion cyst.  I placed a right-angle forceps beneath the transverse scapular ligament and divided ligament with a 15 blade, which allowed complete release of the suprascapular nerve and  notch and a large ganglion then came protruding through the zone confirmed that there had been significant compression on the nerve at this point.  I went ahead and incised the ganglion cyst and evacuated large amount of gelatinous ganglion material affecting a complete decompression and evacuation of the ganglion cyst and confirmed that the scapular notch was appropriately decompressed and the nerve was decompressed.  The wound was then irrigated.  Hemostasis was obtained. The supraspinatus was allowed to reflect back into normal position.  The trapezius was then repaired back to the scapular spine with series of figure-of-eight #1 Vicryl sutures, 2-0 Vicryl used for the subcu layer of the skin, intracuticular 3-0 Monocryl for the skin.  Tracey Shuford, PA-C was used as an Geophysicist/field seismologist throughout this case, essential for help with positioning the extremity, management of the arthroscopic equipment, retraction, exposure, wound closure, and intraoperative decision making.  Dry dressing was placed on the left shoulder.  Left arm was placed in a sling.  The patient was awakened, extubated, and taken to recovery room in stable condition.     Vania Rea. Kimberlynn Lumbra, M.D.     KMS/MEDQ  D:  12/01/2012  T:  12/02/2012  Job:  [id number]

## 2012-12-08 NOTE — Addendum Note (Signed)
Addendum created 12/08/12 1157 by Raiford Simmonds, MD   Modules edited: Anesthesia Blocks and Procedures, Clinical Notes   Clinical Notes:  File: [id number]

## 2012-12-16 IMAGING — RF DG ESOPHAGUS
15 of 18 series · 19 of 24 positions shown · non-contrast
Comparison: None.

CLINICAL DATA: Dysphagia.  History of cervical spine fusion.

ESOPHOGRAM/BARIUM SWALLOW
TECHNIQUE: Combined double contrast and single contrast
examination performed using effervescent crystals, thick barium
liquid, and thin barium liquid.
Fluoroscopy time:  1.5 minutes.

[Series 1: run · 3 of 8 slices shown (1 of 15)]
[im 1/8]
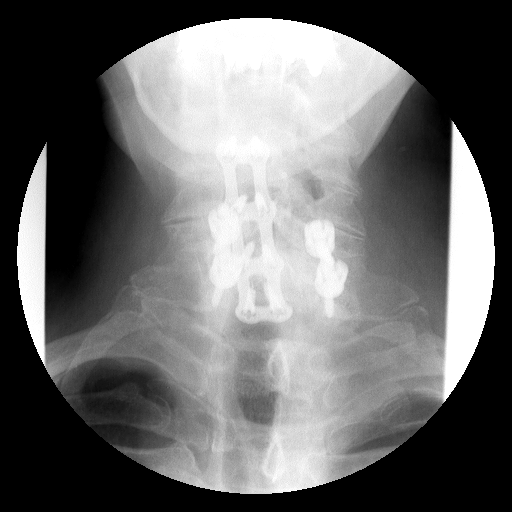
[im 3/8]
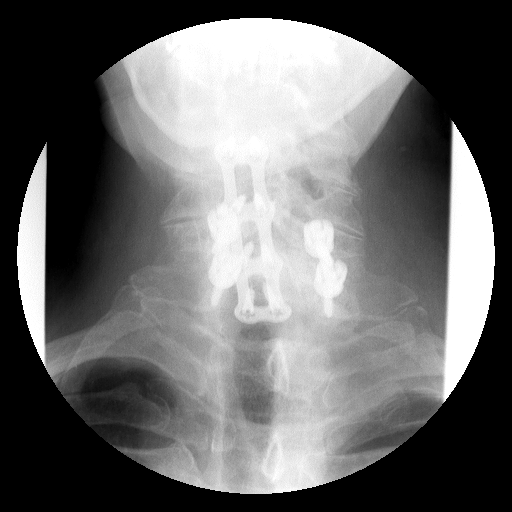
[im 8/8]
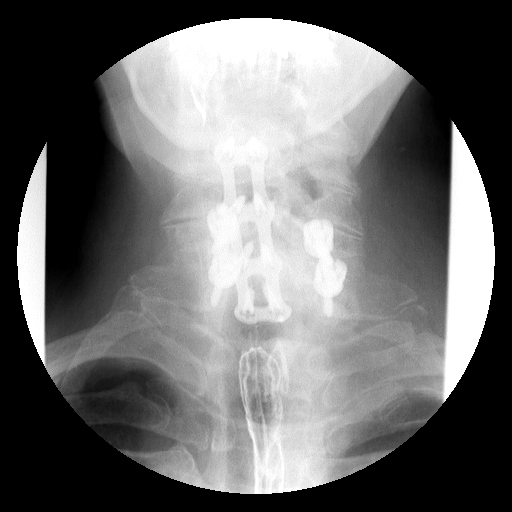

[Series 2: run · 3 of 8 slices shown (2 of 15)]
[im 1/8]
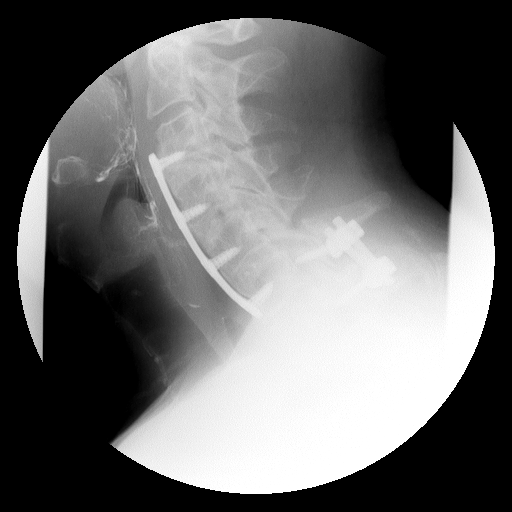
[im 3/8]
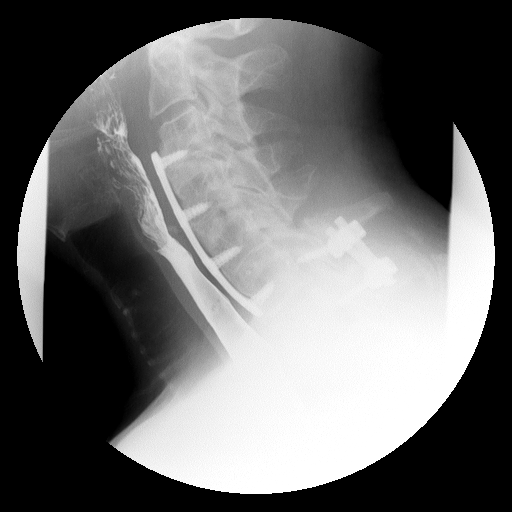
[im 5/8]
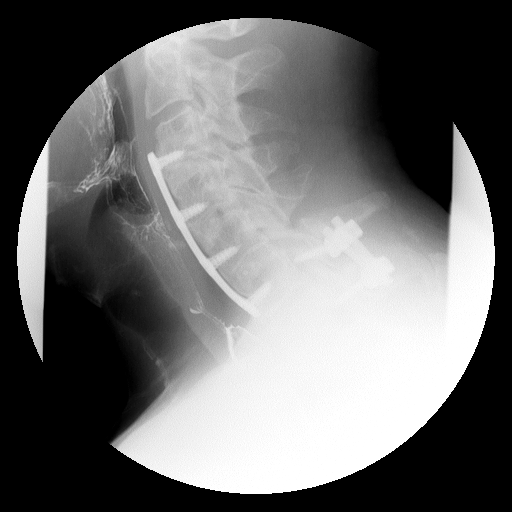

[Series 3: run · 1 of 2 slices shown (3 of 15)]
[im 1/2]
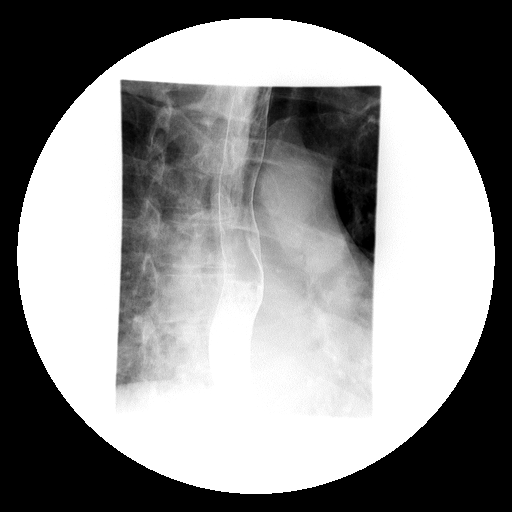

[Series 4: run · 1 of 1 slices shown (4 of 15)]
[im 1/1]
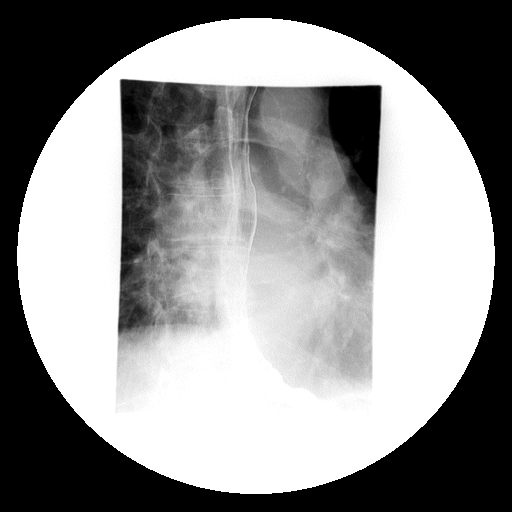

[Series 5: run · 1 of 3 slices shown (5 of 15)]
[im 1/3]
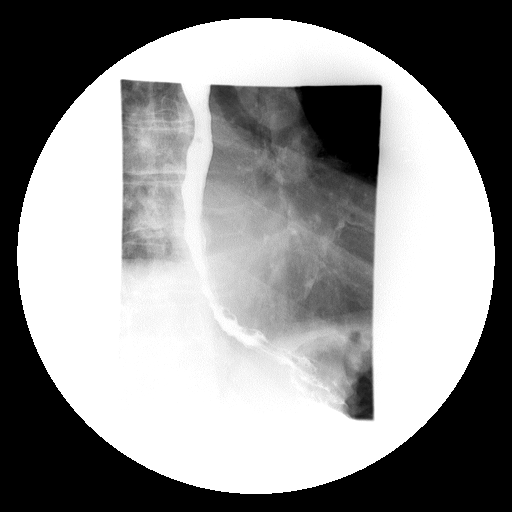

[Series 7: run · 1 of 1 slices shown (6 of 15)]
[im 1/1]
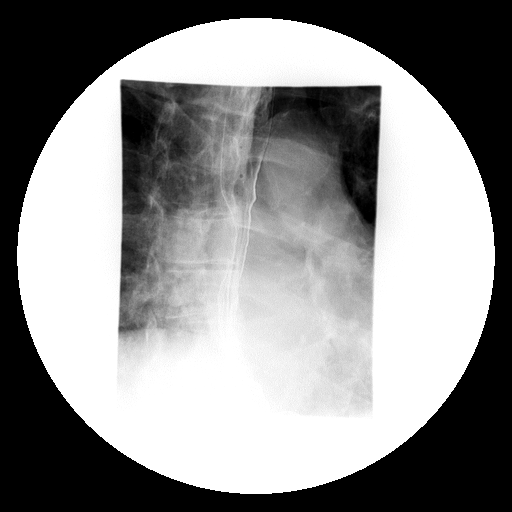

[Series 8: run · 1 of 1 slices shown (7 of 15)]
[im 1/1]
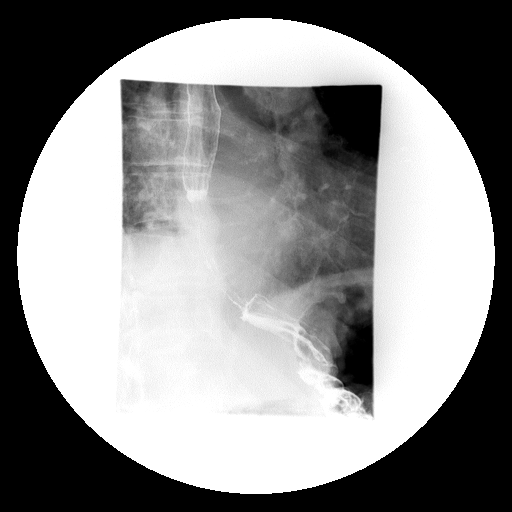

[Series 9: run · 1 of 1 slices shown (8 of 15)]
[im 1/1]
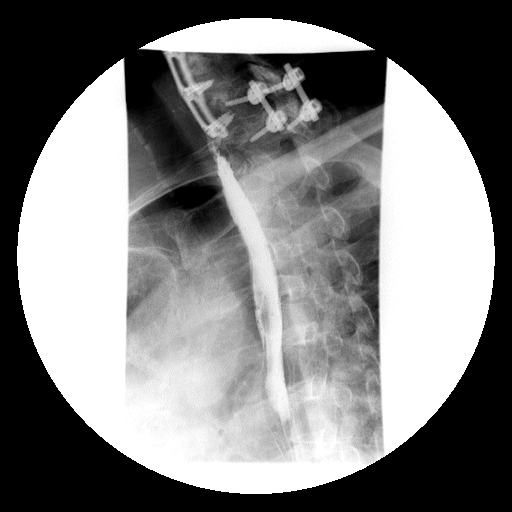

[Series 10: run · 1 of 1 slices shown (9 of 15)]
[im 1/1]
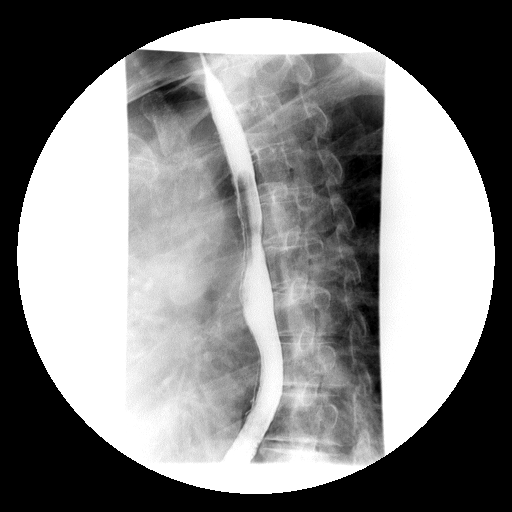

[Series 12: run · 1 of 1 slices shown (10 of 15)]
[im 1/1]
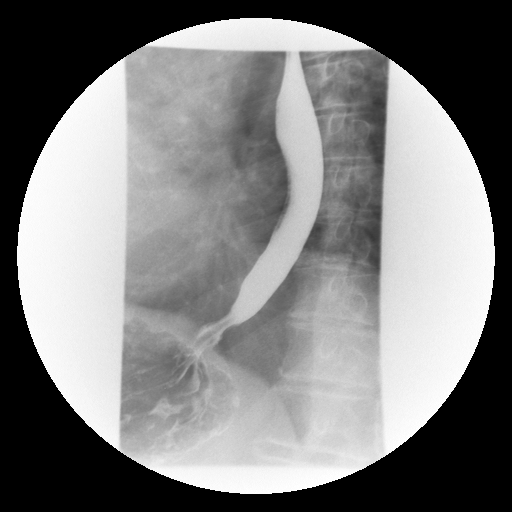

[Series 13: run · 1 of 1 slices shown (11 of 15)]
[im 1/1]
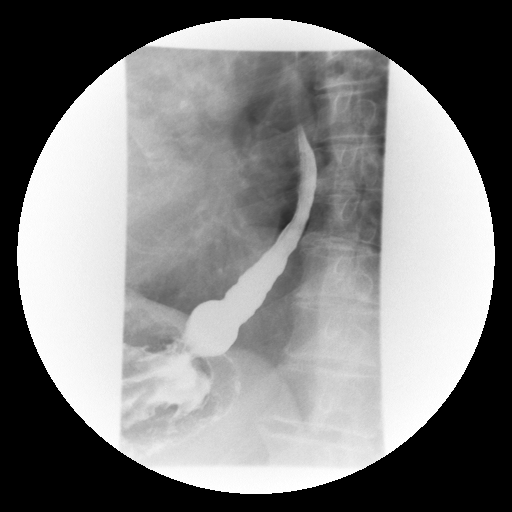

[Series 14: run · 1 of 1 slices shown (12 of 15)]
[im 1/1]
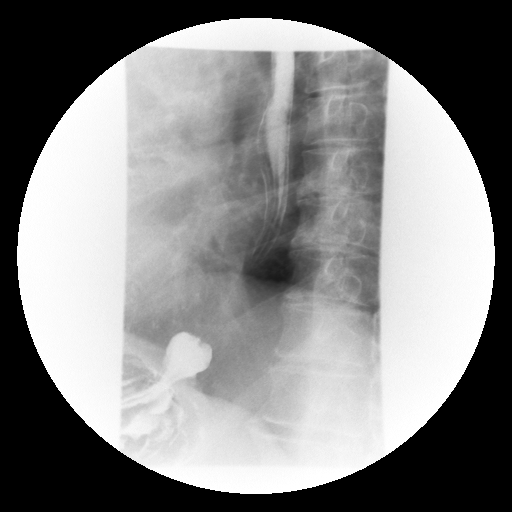

[Series 15: run · 1 of 1 slices shown (13 of 15)]
[im 1/1]
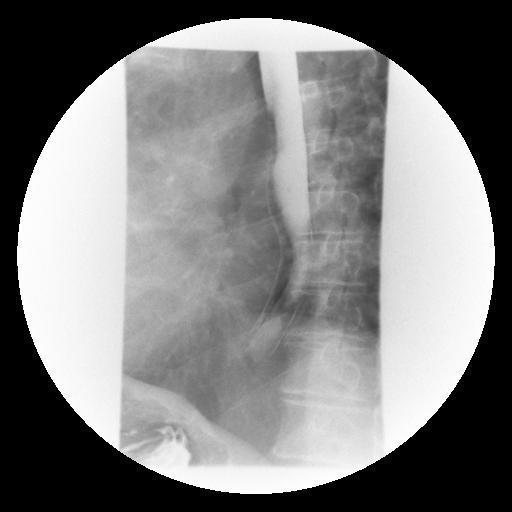

[Series 17: run · 1 of 1 slices shown (14 of 15)]
[im 1/1]
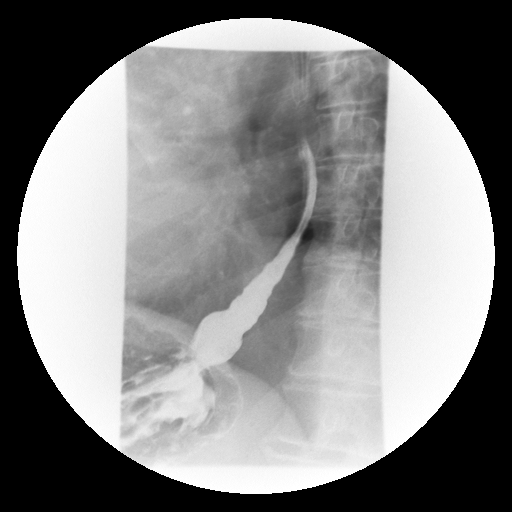

[Series 18: run · 1 of 1 slices shown (15 of 15)]
[im 1/1]
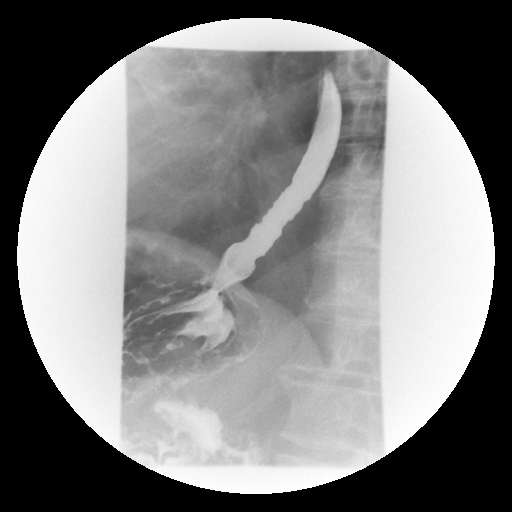

[19 of 24 positions shown; findings below may reference images not displayed]

FINDINGS: Initial barium swallows demonstrate grossly normal
pharyngeal motion with swallowing.  No laryngeal penetration or
aspiration.  No upper esophageal webs, strictures or diverticuli.
I do not see any abnormal soft tissue thickening between the
esophagus and the anterior cervical plate.

Nonspecific esophageal motility disorder with occasional disruption
of the primary peristaltic wave and occasional tertiary
contractions.  There is a small sliding-type hiatal hernia.  No
gastroesophageal reflux was demonstrated.  The 13 mm barium pill
passed into the stomach without difficulty.
IMPRESSION: 1.  Esophageal dysmotility.
2.  No esophageal stricture or mass.
3.  Small sliding-type hiatal hernia.  No gastroesophageal reflux
was demonstrated.

## 2013-03-04 ENCOUNTER — Encounter (HOSPITAL_COMMUNITY): Payer: Self-pay | Admitting: *Deleted

## 2013-03-04 ENCOUNTER — Emergency Department (HOSPITAL_COMMUNITY): Payer: Medicare Other

## 2013-03-04 ENCOUNTER — Emergency Department (HOSPITAL_COMMUNITY)
Admission: EM | Admit: 2013-03-04 | Discharge: 2013-03-04 | Disposition: A | Discharge status: Home / Self Care | Payer: Medicare Other | Attending: Emergency Medicine | Admitting: Emergency Medicine

## 2013-03-04 DIAGNOSIS — M660 Rupture of popliteal cyst: Secondary | ICD-10-CM

## 2013-03-04 DIAGNOSIS — M129 Arthropathy, unspecified: Secondary | ICD-10-CM | POA: Insufficient documentation

## 2013-03-04 DIAGNOSIS — M25469 Effusion, unspecified knee: Secondary | ICD-10-CM | POA: Insufficient documentation

## 2013-03-04 DIAGNOSIS — Z7982 Long term (current) use of aspirin: Secondary | ICD-10-CM | POA: Insufficient documentation

## 2013-03-04 DIAGNOSIS — Z87891 Personal history of nicotine dependence: Secondary | ICD-10-CM | POA: Insufficient documentation

## 2013-03-04 DIAGNOSIS — Z8739 Personal history of other diseases of the musculoskeletal system and connective tissue: Secondary | ICD-10-CM | POA: Insufficient documentation

## 2013-03-04 DIAGNOSIS — IMO0001 Reserved for inherently not codable concepts without codable children: Secondary | ICD-10-CM | POA: Insufficient documentation

## 2013-03-04 DIAGNOSIS — M7989 Other specified soft tissue disorders: Secondary | ICD-10-CM

## 2013-03-04 DIAGNOSIS — K219 Gastro-esophageal reflux disease without esophagitis: Secondary | ICD-10-CM | POA: Insufficient documentation

## 2013-03-04 DIAGNOSIS — Z9861 Coronary angioplasty status: Secondary | ICD-10-CM | POA: Insufficient documentation

## 2013-03-04 DIAGNOSIS — Z79899 Other long term (current) drug therapy: Secondary | ICD-10-CM | POA: Insufficient documentation

## 2013-03-04 DIAGNOSIS — E7800 Pure hypercholesterolemia, unspecified: Secondary | ICD-10-CM | POA: Insufficient documentation

## 2013-03-04 DIAGNOSIS — M79605 Pain in left leg: Secondary | ICD-10-CM

## 2013-03-04 DIAGNOSIS — M7120 Synovial cyst of popliteal space [Baker], unspecified knee: Secondary | ICD-10-CM | POA: Insufficient documentation

## 2013-03-04 DIAGNOSIS — F3289 Other specified depressive episodes: Secondary | ICD-10-CM | POA: Insufficient documentation

## 2013-03-04 DIAGNOSIS — Z8669 Personal history of other diseases of the nervous system and sense organs: Secondary | ICD-10-CM | POA: Insufficient documentation

## 2013-03-04 DIAGNOSIS — I10 Essential (primary) hypertension: Secondary | ICD-10-CM | POA: Insufficient documentation

## 2013-03-04 DIAGNOSIS — F329 Major depressive disorder, single episode, unspecified: Secondary | ICD-10-CM | POA: Insufficient documentation

## 2013-03-04 DIAGNOSIS — M79609 Pain in unspecified limb: Secondary | ICD-10-CM | POA: Insufficient documentation

## 2013-03-04 DIAGNOSIS — I2510 Atherosclerotic heart disease of native coronary artery without angina pectoris: Secondary | ICD-10-CM | POA: Insufficient documentation

## 2013-03-04 DIAGNOSIS — I252 Old myocardial infarction: Secondary | ICD-10-CM | POA: Insufficient documentation

## 2013-03-04 DIAGNOSIS — F411 Generalized anxiety disorder: Secondary | ICD-10-CM | POA: Insufficient documentation

## 2013-03-04 MED ORDER — IBUPROFEN 800 MG PO TABS
800.0000 mg | ORAL_TABLET | Freq: Once | ORAL | Status: AC
Start: 2013-03-04 — End: 2013-03-04
  Administered 2013-03-04: 800 mg via ORAL
  Filled 2013-03-04: qty 1

## 2013-03-04 NOTE — ED Notes (Signed)
Pt sent here from [facility] for left knee pain for several days, pt reports using tractor a lot two days ago and thought he strained his leg, was having pain and swelling to left knee. Woke up this am with swelling and pain to lower leg and tenderness to back of thigh. Ambulatory at triage.

## 2013-03-04 NOTE — ED Provider Notes (Signed)
CSN: [id number]     Arrival date & time 03/04/13  1310 History     First MD Initiated Contact with Patient 03/04/13 1500     Chief Complaint  Patient presents with  . Leg Pain   (Consider location/radiation/quality/duration/timing/severity/associated sxs/prior Treatment) HPI  Patient is a 67 yo M PMHx significant for HTN, Depression, CAD, GERD, MI, chronic pain presenting to the ED for one week of worsening left leg pain w/ left knee swelling. Patient describes pain as tightness stating it is from mid-thigh to mid-calf. Patient rates his pain 3/10. Alleviating factors include rest, while aggravating factors include ambulating. Patient is able to ambulate on his leg though. He denies any history of recent surgeries, history of DVT/PE, SOB, falls or injury to limb. Patient states he saw his PCP and was sent here for further evaluation of possible DVT.   Past Medical History  Diagnosis Date  . Hypertension   . Shortness of breath   . Depression   . Coronary artery disease   . GERD (gastroesophageal reflux disease)   . Arthritis   . Anxiety   . Neuromuscular disorder     hx of shingles/ myofacitis  . Cervical disc disease 07/20/2011  . Headache(784.0)     years ago  . Myocardial infarct     2001  sees Dr. York Spaniel  . Elevated cholesterol    Past Surgical History  Procedure Laterality Date  . Shoulder surgery      twice  left shoulder  . Laminectomy and microdiscectomy cervical spine      January and August 2010  . Coronary angioplasty      x 2  last one 07/2011  . Shoulder arthroscopy with labral repair Left 12/01/2012    Procedure: LEFT SHOULDER ARTHROSCOPY WITH ASPIRATION OF DEGENERATIVE OF THE PARALABRAL CYST and OPEN DECOMPRESSION OF SUPRASCAPULAR NERVE;  Surgeon: Senaida Lange, MD;  Location: [facility] OR;  Service: Orthopedics;  Laterality: Left;   History reviewed. No pertinent family history. History  Substance Use Topics  . Smoking status: Former Smoker -- 0.90 packs/day  for 40 years    Types: Cigarettes    Quit date: 07/19/2004  . Smokeless tobacco: Never Used  . Alcohol Use: Yes     Comment: occasionally    Review of Systems  Constitutional: Negative for fever and chills.  HENT: Negative.   Eyes: Negative.   Respiratory: Negative for shortness of breath.   Cardiovascular: Negative for chest pain.  Gastrointestinal: Negative for nausea, vomiting and abdominal pain.  Genitourinary: Negative.   Musculoskeletal: Positive for myalgias, joint swelling and arthralgias.  Skin: Negative.   Neurological: Negative for headaches.    Allergies  Sulfa antibiotics and Tetanus toxoids  Home Medications   Current Outpatient Rx  Name  Route  Sig  Dispense  Refill  . amLODipine (NORVASC) 5 MG tablet   Oral   Take 5 mg by mouth daily.           Marland Kitchen aspirin EC 81 MG tablet   Oral   Take 81 mg by mouth daily.           Marland Kitchen atorvastatin (LIPITOR) 40 MG tablet   Oral   Take 40 mg by mouth daily.         Marland Kitchen escitalopram (LEXAPRO) 10 MG tablet   Oral   Take 15 mg by mouth daily.          Marland Kitchen esomeprazole (NEXIUM) 40 MG capsule   Oral  Take 40 mg by mouth daily before breakfast.         . fentaNYL (DURAGESIC - DOSED MCG/HR) 75 MCG/HR   Transdermal   Place 1 patch onto the skin every 3 (three) days.         . folic acid (FOLVITE) 800 MCG tablet   Oral   Take 800 mcg by mouth daily.           Marland Kitchen ibuprofen (ADVIL,MOTRIN) 200 MG tablet   Oral   Take 600 mg by mouth every 8 (eight) hours as needed for pain.         . nitroGLYCERIN (NITROSTAT) 0.4 MG SL tablet   Sublingual   Place 0.4 mg under the tongue every 5 (five) minutes as needed.           Marland Kitchen oxyCODONE-acetaminophen (PERCOCET) 10-325 MG per tablet   Oral   Take 2 tablets by mouth every 4 (four) hours as needed for pain.         . ramipril (ALTACE) 10 MG capsule   Oral   Take 10 mg by mouth daily.           Marland Kitchen rOPINIRole (REQUIP) 2 MG tablet   Oral   Take 3 mg by mouth  at bedtime.          Marland Kitchen tiotropium (SPIRIVA) 18 MCG inhalation capsule   Inhalation   Place 1 capsule (18 mcg total) into inhaler and inhale daily.   30 capsule   6    BP 141/74  Pulse 52  Temp(Src) 97.8 F (36.6 C) (Oral)  Resp 16  SpO2 95% Physical Exam  Constitutional: He is oriented to person, place, and time. He appears well-developed and well-nourished. No distress.  HENT:  Head: Normocephalic and atraumatic.  Mouth/Throat: Oropharynx is clear and moist.  Eyes: Conjunctivae are normal.  Neck: Neck supple.  Cardiovascular: Normal rate, regular rhythm and intact distal pulses.   Pulmonary/Chest: Effort normal and breath sounds normal.  Abdominal: Soft.  Musculoskeletal:       Right knee: Normal.       Left knee: He exhibits decreased range of motion, swelling and effusion. He exhibits no deformity, no laceration and no erythema. Tenderness found.       Right ankle: Normal.       Left ankle: Normal. Achilles tendon normal.       Right upper leg: Normal.       Left upper leg: He exhibits tenderness. He exhibits no swelling, no edema, no deformity and no laceration.       Right lower leg: Normal.       Left lower leg: He exhibits tenderness. He exhibits no bony tenderness, no swelling, no edema, no deformity and no laceration.  Neurological: He is alert and oriented to person, place, and time.  Skin: Skin is warm and dry. He is not diaphoretic.    ED Course   Procedures (including critical care time)  Labs Reviewed - No data to display Dg Knee Complete 4 Views Left  03/04/2013   *RADIOLOGY REPORT*  Clinical Data: Anterior pain  LEFT KNEE - COMPLETE 4+ VIEW  Comparison: None.  Findings: There is a large knee joint effusion.  There is no joint space narrowing.  No osteophyte formation.  No fracture or other focal lesion.  Arterial calcification is noted.  IMPRESSION: Large joint effusion.  No other findings.   Original Report Authenticated By: Paulina Fusi, M.D.   1. Left  leg pain  2. Rupture of popliteal cyst     MDM  Neurovascularly intact on examination of left leg. There was increased swelling of left knee and leg compared to right. X-ray obtained of knee. Venous duplex obtained to r/o DVT. Duplex revealed ruptured popliteal cyst, but no clot. Advised NSAID use for swelling. Advised PCP f/u. Patient is agreeable to plan. Patient d/w with Dr. Oletta Lamas, agrees with plan. Patient is stable at time of discharge     Jeannetta Ellis, PA-C 03/04/13 2354

## 2013-03-04 NOTE — Progress Notes (Signed)
VASCULAR LAB PRELIMINARY  PRELIMINARY  PRELIMINARY  PRELIMINARY  Left lower extremity venous duplex completed.    Preliminary report:  Left - No evidence of DVT or superficial thrombosis. There is evidence of a ruptured Baker's cyst coursing 3.95cm from the popliteal fossa into the calf  Dellar Traber, RVS 03/04/2013, 4:48 PM

## 2013-03-06 NOTE — ED Provider Notes (Signed)
Medical screening examination/treatment/procedure(s) were performed by non-physician practitioner and as supervising physician I was immediately available for consultation/collaboration.  Gavin Pound. Elfego Giammarino, MD 03/06/13 2332

## 2013-11-17 ENCOUNTER — Ambulatory Visit (INDEPENDENT_AMBULATORY_CARE_PROVIDER_SITE_OTHER)
Admission: RE | Admit: 2013-11-17 | Discharge: 2013-11-17 | Disposition: A | Discharge status: Home / Self Care | Payer: Medicare Other | Source: Ambulatory Visit | Attending: Pulmonary Disease | Admitting: Pulmonary Disease

## 2013-11-17 DIAGNOSIS — R911 Solitary pulmonary nodule: Secondary | ICD-10-CM

## 2014-04-02 ENCOUNTER — Other Ambulatory Visit (HOSPITAL_COMMUNITY): Payer: Self-pay | Admitting: Physical Medicine and Rehabilitation

## 2014-04-02 ENCOUNTER — Ambulatory Visit (HOSPITAL_COMMUNITY)
Admission: RE | Admit: 2014-04-02 | Discharge: 2014-04-02 | Disposition: A | Discharge status: Home / Self Care | Payer: Medicare Other | Source: Ambulatory Visit | Attending: Physical Medicine and Rehabilitation | Admitting: Physical Medicine and Rehabilitation

## 2014-04-02 DIAGNOSIS — Z981 Arthrodesis status: Secondary | ICD-10-CM | POA: Diagnosis not present

## 2014-04-02 DIAGNOSIS — M531 Cervicobrachial syndrome: Secondary | ICD-10-CM | POA: Insufficient documentation

## 2014-04-02 DIAGNOSIS — M5030 Other cervical disc degeneration, unspecified cervical region: Secondary | ICD-10-CM | POA: Insufficient documentation

## 2014-04-02 DIAGNOSIS — M25519 Pain in unspecified shoulder: Secondary | ICD-10-CM | POA: Diagnosis not present

## 2014-04-02 DIAGNOSIS — M25512 Pain in left shoulder: Secondary | ICD-10-CM

## 2014-04-02 DIAGNOSIS — M542 Cervicalgia: Secondary | ICD-10-CM | POA: Insufficient documentation

## 2014-04-06 ENCOUNTER — Other Ambulatory Visit (HOSPITAL_COMMUNITY): Payer: Self-pay | Admitting: Physical Medicine and Rehabilitation

## 2014-04-06 DIAGNOSIS — M961 Postlaminectomy syndrome, not elsewhere classified: Secondary | ICD-10-CM

## 2014-04-06 DIAGNOSIS — R1310 Dysphagia, unspecified: Secondary | ICD-10-CM

## 2014-04-06 DIAGNOSIS — M25512 Pain in left shoulder: Secondary | ICD-10-CM

## 2014-04-23 ENCOUNTER — Ambulatory Visit (HOSPITAL_COMMUNITY)
Admission: RE | Admit: 2014-04-23 | Discharge: 2014-04-23 | Disposition: A | Discharge status: Home / Self Care | Payer: Medicare Other | Source: Ambulatory Visit | Attending: Physical Medicine and Rehabilitation | Admitting: Physical Medicine and Rehabilitation

## 2014-04-23 DIAGNOSIS — R1310 Dysphagia, unspecified: Secondary | ICD-10-CM

## 2014-04-23 DIAGNOSIS — K219 Gastro-esophageal reflux disease without esophagitis: Secondary | ICD-10-CM | POA: Diagnosis not present

## 2014-04-23 DIAGNOSIS — K449 Diaphragmatic hernia without obstruction or gangrene: Secondary | ICD-10-CM | POA: Diagnosis not present

## 2014-04-24 ENCOUNTER — Ambulatory Visit (HOSPITAL_COMMUNITY)
Admission: RE | Admit: 2014-04-24 | Discharge: 2014-04-24 | Disposition: A | Discharge status: Home / Self Care | Payer: Medicare Other | Source: Ambulatory Visit | Attending: Physical Medicine and Rehabilitation | Admitting: Physical Medicine and Rehabilitation

## 2014-04-24 ENCOUNTER — Ambulatory Visit (HOSPITAL_COMMUNITY): Payer: Medicare Other

## 2014-04-24 ENCOUNTER — Encounter (HOSPITAL_COMMUNITY): Payer: Self-pay

## 2014-04-24 DIAGNOSIS — F329 Major depressive disorder, single episode, unspecified: Secondary | ICD-10-CM | POA: Insufficient documentation

## 2014-04-24 DIAGNOSIS — G8929 Other chronic pain: Secondary | ICD-10-CM | POA: Insufficient documentation

## 2014-04-24 DIAGNOSIS — M961 Postlaminectomy syndrome, not elsewhere classified: Secondary | ICD-10-CM

## 2014-04-24 DIAGNOSIS — G2581 Restless legs syndrome: Secondary | ICD-10-CM | POA: Insufficient documentation

## 2014-04-24 DIAGNOSIS — M25512 Pain in left shoulder: Secondary | ICD-10-CM

## 2014-04-24 DIAGNOSIS — M25519 Pain in unspecified shoulder: Secondary | ICD-10-CM | POA: Diagnosis not present

## 2014-06-13 ENCOUNTER — Encounter (HOSPITAL_COMMUNITY): Payer: Self-pay

## 2014-06-13 ENCOUNTER — Encounter (HOSPITAL_COMMUNITY)
Admission: RE | Admit: 2014-06-13 | Discharge: 2014-06-13 | Disposition: A | Discharge status: Home / Self Care | Payer: Medicare Other | Source: Ambulatory Visit | Attending: Orthopedic Surgery | Admitting: Orthopedic Surgery

## 2014-06-13 DIAGNOSIS — M65812 Other synovitis and tenosynovitis, left shoulder: Secondary | ICD-10-CM | POA: Diagnosis not present

## 2014-06-13 DIAGNOSIS — Z79899 Other long term (current) drug therapy: Secondary | ICD-10-CM | POA: Diagnosis not present

## 2014-06-13 DIAGNOSIS — Z87891 Personal history of nicotine dependence: Secondary | ICD-10-CM | POA: Diagnosis not present

## 2014-06-13 DIAGNOSIS — K219 Gastro-esophageal reflux disease without esophagitis: Secondary | ICD-10-CM | POA: Diagnosis not present

## 2014-06-13 DIAGNOSIS — I10 Essential (primary) hypertension: Secondary | ICD-10-CM | POA: Diagnosis not present

## 2014-06-13 DIAGNOSIS — F418 Other specified anxiety disorders: Secondary | ICD-10-CM | POA: Diagnosis not present

## 2014-06-13 DIAGNOSIS — I252 Old myocardial infarction: Secondary | ICD-10-CM | POA: Diagnosis not present

## 2014-06-13 DIAGNOSIS — Z955 Presence of coronary angioplasty implant and graft: Secondary | ICD-10-CM | POA: Diagnosis not present

## 2014-06-13 DIAGNOSIS — M1990 Unspecified osteoarthritis, unspecified site: Secondary | ICD-10-CM | POA: Diagnosis not present

## 2014-06-13 DIAGNOSIS — M19012 Primary osteoarthritis, left shoulder: Secondary | ICD-10-CM | POA: Diagnosis not present

## 2014-06-13 DIAGNOSIS — M94212 Chondromalacia, left shoulder: Secondary | ICD-10-CM | POA: Diagnosis not present

## 2014-06-13 DIAGNOSIS — I2510 Atherosclerotic heart disease of native coronary artery without angina pectoris: Secondary | ICD-10-CM | POA: Diagnosis not present

## 2014-06-13 DIAGNOSIS — M25812 Other specified joint disorders, left shoulder: Secondary | ICD-10-CM | POA: Diagnosis present

## 2014-06-13 HISTORY — DX: Other complications of anesthesia, initial encounter: T88.59XA

## 2014-06-13 HISTORY — DX: Personal history of other diseases of the digestive system: Z87.19

## 2014-06-13 HISTORY — DX: Synovial cyst of popliteal space (Baker), unspecified knee: M71.20

## 2014-06-13 HISTORY — DX: Adverse effect of unspecified anesthetic, initial encounter: T41.45XA

## 2014-06-13 LAB — CBC WITH DIFFERENTIAL/PLATELET
Basophils Absolute: 0.1 10*3/uL (ref 0.0–0.1)
Basophils Relative: 1 % (ref 0–1)
EOS PCT: 3 % (ref 0–5)
Eosinophils Absolute: 0.2 10*3/uL (ref 0.0–0.7)
HCT: 35.2 % — ABNORMAL LOW (ref 39.0–52.0)
Hemoglobin: 12.1 g/dL — ABNORMAL LOW (ref 13.0–17.0)
LYMPHS ABS: 1.6 10*3/uL (ref 0.7–4.0)
LYMPHS PCT: 32 % (ref 12–46)
MCH: 28.5 pg (ref 26.0–34.0)
MCHC: 34.4 g/dL (ref 30.0–36.0)
MCV: 83 fL (ref 78.0–100.0)
MONO ABS: 0.3 10*3/uL (ref 0.1–1.0)
MONOS PCT: 6 % (ref 3–12)
Neutro Abs: 2.8 10*3/uL (ref 1.7–7.7)
Neutrophils Relative %: 58 % (ref 43–77)
Platelets: 169 10*3/uL (ref 150–400)
RBC: 4.24 MIL/uL (ref 4.22–5.81)
RDW: 12.9 % (ref 11.5–15.5)
WBC: 4.9 10*3/uL (ref 4.0–10.5)

## 2014-06-13 LAB — COMPREHENSIVE METABOLIC PANEL
ALT: 9 U/L (ref 0–53)
AST: 15 U/L (ref 0–37)
Albumin: 3.8 g/dL (ref 3.5–5.2)
Alkaline Phosphatase: 61 U/L (ref 39–117)
Anion gap: 10 (ref 5–15)
BILIRUBIN TOTAL: 0.8 mg/dL (ref 0.3–1.2)
BUN: 11 mg/dL (ref 6–23)
CALCIUM: 8.7 mg/dL (ref 8.4–10.5)
CHLORIDE: 102 meq/L (ref 96–112)
CO2: 28 meq/L (ref 19–32)
CREATININE: 0.88 mg/dL (ref 0.50–1.35)
GFR, EST NON AFRICAN AMERICAN: 87 mL/min — AB (ref >90)
GLUCOSE: 90 mg/dL (ref 70–99)
Potassium: 3.8 mEq/L (ref 3.7–5.3)
Sodium: 140 mEq/L (ref 137–147)
Total Protein: 6.6 g/dL (ref 6.0–8.3)

## 2014-06-13 LAB — APTT: aPTT: 33 seconds (ref 24–37)

## 2014-06-13 LAB — PROTIME-INR
INR: 1.03 (ref 0.00–1.49)
PROTHROMBIN TIME: 13.7 s (ref 11.6–15.2)

## 2014-06-13 MED ORDER — LACTATED RINGERS IV SOLN
INTRAVENOUS | Status: DC
Start: 2014-06-13 — End: 2014-06-14
  Administered 2014-06-14: 12:00:00 via INTRAVENOUS

## 2014-06-13 MED ORDER — CEFAZOLIN SODIUM-DEXTROSE 2-3 GM-% IV SOLR
2.0000 g | INTRAVENOUS | Status: AC
  Administered 2014-06-14: 2 g via INTRAVENOUS
  Filled 2014-06-13: qty 50

## 2014-06-13 MED ORDER — CHLORHEXIDINE GLUCONATE 4 % EX LIQD
60.0000 mL | Freq: Once | CUTANEOUS | Status: DC
  Filled 2014-06-13: qty 60

## 2014-06-13 NOTE — Progress Notes (Signed)
Anesthesia chart review:  Patient is a 68 year old male scheduled for left shoulder arthroscopy with labral repair evacuation or para cyst subacromial decompression an29d distal clavicle resection tomorrow by Dr. Rennis ChrisSupple. PAT was this morning.  I was given chart to review after he had left PAT.  History includes MI/CAD s/p LAD stent and PTCA OM1 '01, CAD s/p , HTN, former smoker, GERD, hiatal hernia, hypercholesterolemia, OSA, SOB, depression, anxiety, cervical disc disease s/p surgery '10, bilateral pulmonary nodules (11/2013; felt stable since 02/2012 and most compatible with benign process likely scarring). He has been combative on emergence on the past. PCP is Dr. Kandyce RudMarcus Babaoff. He has seen pulmonologist Dr. Shelle Ironlance in the past.   Cardiologist is Dr. Donnie Ahoilley, last visit 11/27/13. At that time patient was doing well and by notes was exercising on a regular basis.  No CV symptoms and one year follow-up was recommended at that time.    Patient had thought his last EKG from Dr. Donnie Ahoilley was within the past year, but the one received was from 11/15/12 and showed NSR.  He will need a more recent EKG on the day of surgery.  Echo on 07/21/11:  - Left ventricle: The cavity size was normal. Systolic function was normal. The estimated ejection fraction was in the range of 55% to 60%. Wall motion was normal; there were no regional wall motion abnormalities. - Left atrium: The atrium was mildly dilated. - Tricuspid valve: Mild regurgitation.  07/20/11 LHC: CORONARY ARTERIES: Arise and distribute normally. Right dominant. Coronary calcification is noted in the ostium of the right coronary artery. - Left main coronary artery: Normal. - Left anterior descending: Large vessel extending to the apex. The previous stent in the proximal left anterior descending is widely patent without significant stenosis.. - Circumflex coronary artery: The first marginal artery has a 30-40% stenosis in its proximal portion at the site of  the previous angioplasty. There is no structural disease noted in the remainder of the artery.  - Right coronary artery: Calcified ostium. The artery is right dominant and contains scattered irregularities but no significant stenosis is noted.Marland Kitchen. LEFT VENTRICULOGRAM: Performed in the 30 RAO projection. The aortic and mitral valves are normal. The left ventricle is normal in size with normal wall motion. Estimated ejection fraction is 60%. IMPRESSIONS: 1. Widely patent stent site in the proximal LAD and patent PTCA site of the circumflex without interval obstructive coronary artery disease noted. 2. Normal left ventricular function with normal LVEDP. RECOMMENDATION: At this point the patient does not have significant obstructive coronary artery disease and has normal LV function. Evaluate for other causes of dyspnea at this point. Obtain CT angiogram to evaluate for pulmonary embolus. (Dr. Donnie Ahoilley)  PFTs 09/04/11: FVC 4.37 (100%), FEV1 3.00 (99%), FEF25-75% 1.62 (56%), TLC 7.08 (111%), DLCO 22.0 (92%). Very mild obstructive lung defect. Lung volumes WNL, Diffusion capacity WNL. No response to BD.    Preoperative labs noted.  Patient had a cardiac cath with patent LAD stent and otherwise non-obstructive CAD by cath within the past three years.  Cardiology follow-up was within the year without additional testing ordered. He was exercising on a regular basis then. He does need an EKG on arrival.  If his EKG is stable and no acute changes/new CV symptoms then I anticipate that he can proceed as planned.    Velna Ochsllison Zelenak, PA-C [facility]/Anesthesiology Phone [phone] 06/13/2014 2:52 PM

## 2014-06-13 NOTE — Progress Notes (Signed)
   06/13/14 1016  OBSTRUCTIVE SLEEP APNEA  Have you ever been diagnosed with sleep apnea through a sleep study? No  Do you snore loudly (loud enough to be heard through closed doors)?  1  Do you often feel tired, fatigued, or sleepy during the daytime? 1  Has anyone observed you stop breathing during your sleep? 1  Do you have, or are you being treated for high blood pressure? 1  BMI more than 35 kg/m2? 0  Age over 67 years old? 1  Neck circumference greater than 40 cm/16 inches? 1  Gender: 1  Obstructive Sleep Apnea Score 7

## 2014-06-13 NOTE — Pre-Procedure Instructions (Signed)
Clayton Anderson  06/13/2014   Your procedure is scheduled on: Thursday, June 14, 2014   Report to [facility] Admitting at 1:00 PM.  Call this number if you have problems the morning of surgery: [phone]   Remember:   Do not eat food or drink liquids after midnight.   Take these medicines the morning of surgery with A SIP OF WATER: amLODipine (NORVASC), escitalopram (LEXAPRO), esomeprazole (NEXIUM), gabapentin (NEURONTIN), tiotropium (SPIRIVA) 18 MCG inhaler, if needed: oxyCODONE ( Percocet) for pain, nitroGLYCERIN (NITROSTAT)  Stop taking Aspirin, vitamins, and herbal medications. Do not take any NSAIDs ie: Ibuprofen, Advil, Naproxen or any medication containing Aspirin.   Do not wear jewelry, make-up or nail polish.  Do not wear lotions, powders, or perfumes. You may not wear deodorant.  Do not shave 48 hours prior to surgery. Men may shave face and neck.  Do not bring valuables to the hospital.  [facility] is not responsible for any belongings or valuables.               Contacts, dentures or bridgework may not be worn into surgery.  Leave suitcase in the car. After surgery it may be brought to your room.  For patients admitted to the hospital, discharge time is determined by your treatment team.               Patients discharged the day of surgery will not be allowed to drive home.  Name and phone number of your driver:   Special Instructions:  Special Instructions:Special Instructions: [facility] - Pre[facility]  Before surgery, you can play an important role.  Because skin is not sterile, your skin needs to be as free of germs as possible.  You can reduce the number of germs on you skin by washing with CHG (chlorahexidine gluconate) soap before surgery.  CHG is an antiseptic cleaner which kills germs and bonds with the skin to continue killing germs even after washing.  Please DO NOT use if you have an allergy to CHG or antibacterial soaps.  If your skin  becomes reddened/irritated stop using the CHG and inform your nurse when you arrive at Short Stay.  Do not shave (including legs and underarms) for at least 48 hours prior to the first CHG shower.  You may shave your face.  Please follow these instructions carefully:   1.  Shower with CHG Soap the night before surgery and the morning of Surgery.  2.  If you choose to wash your hair, wash your hair first as usual with your normal shampoo.  3.  After you shampoo, rinse your hair and body thoroughly to remove the Shampoo.  4.  Use CHG as you would any other liquid soap.  You can apply chg directly  to the skin and wash gently with scrungie or a clean washcloth.  5.  Apply the CHG Soap to your body ONLY FROM THE NECK DOWN.  Do not use on open wounds or open sores.  Avoid contact with your eyes, ears, mouth and genitals (private parts).  Wash genitals (private parts) with your normal soap.  6.  Wash thoroughly, paying special attention to the area where your surgery will be performed.  7.  Thoroughly rinse your body with warm water from the neck down.  8.  DO NOT shower/wash with your normal soap after using and rinsing off the CHG Soap.  9.  Pat yourself dry with a clean towel.  10.  Wear clean pajamas.            11.  Place clean sheets on your bed the night of your first shower and do not sleep with pets.  Day of Surgery  Do not apply any lotions/deodorants the morning of surgery.  Please wear clean clothes to the hospital/surgery center.   Please read over the following fact sheets that you were given: Pain Booklet, Coughing and Deep Breathing and Surgical Site Infection Prevention

## 2014-06-14 ENCOUNTER — Encounter (HOSPITAL_COMMUNITY)
Admission: RE | Disposition: A | Discharge status: Home / Self Care | Payer: Self-pay | Source: Ambulatory Visit | Attending: Orthopedic Surgery

## 2014-06-14 ENCOUNTER — Ambulatory Visit (HOSPITAL_COMMUNITY): Payer: Medicare Other | Admitting: Anesthesiology

## 2014-06-14 ENCOUNTER — Ambulatory Visit (HOSPITAL_COMMUNITY)
Admission: RE | Admit: 2014-06-14 | Discharge: 2014-06-14 | Disposition: A | Discharge status: Home / Self Care | Payer: Medicare Other | Source: Ambulatory Visit | Attending: Orthopedic Surgery | Admitting: Orthopedic Surgery

## 2014-06-14 ENCOUNTER — Ambulatory Visit (HOSPITAL_COMMUNITY): Payer: Medicare Other | Admitting: Vascular Surgery

## 2014-06-14 ENCOUNTER — Encounter (HOSPITAL_COMMUNITY): Payer: Self-pay | Admitting: *Deleted

## 2014-06-14 DIAGNOSIS — Z87891 Personal history of nicotine dependence: Secondary | ICD-10-CM | POA: Insufficient documentation

## 2014-06-14 DIAGNOSIS — M19012 Primary osteoarthritis, left shoulder: Secondary | ICD-10-CM | POA: Insufficient documentation

## 2014-06-14 DIAGNOSIS — M65812 Other synovitis and tenosynovitis, left shoulder: Secondary | ICD-10-CM | POA: Insufficient documentation

## 2014-06-14 DIAGNOSIS — M25812 Other specified joint disorders, left shoulder: Secondary | ICD-10-CM | POA: Diagnosis not present

## 2014-06-14 DIAGNOSIS — F418 Other specified anxiety disorders: Secondary | ICD-10-CM | POA: Insufficient documentation

## 2014-06-14 DIAGNOSIS — I10 Essential (primary) hypertension: Secondary | ICD-10-CM | POA: Insufficient documentation

## 2014-06-14 DIAGNOSIS — M94212 Chondromalacia, left shoulder: Secondary | ICD-10-CM | POA: Insufficient documentation

## 2014-06-14 DIAGNOSIS — I252 Old myocardial infarction: Secondary | ICD-10-CM | POA: Insufficient documentation

## 2014-06-14 DIAGNOSIS — K219 Gastro-esophageal reflux disease without esophagitis: Secondary | ICD-10-CM | POA: Insufficient documentation

## 2014-06-14 DIAGNOSIS — Z955 Presence of coronary angioplasty implant and graft: Secondary | ICD-10-CM | POA: Insufficient documentation

## 2014-06-14 DIAGNOSIS — M1990 Unspecified osteoarthritis, unspecified site: Secondary | ICD-10-CM | POA: Insufficient documentation

## 2014-06-14 DIAGNOSIS — Z79899 Other long term (current) drug therapy: Secondary | ICD-10-CM | POA: Insufficient documentation

## 2014-06-14 DIAGNOSIS — I2510 Atherosclerotic heart disease of native coronary artery without angina pectoris: Secondary | ICD-10-CM | POA: Insufficient documentation

## 2014-06-14 SURGERY — SHOULDER ARTHROSCOPY WITH SUBACROMIAL DECOMPRESSION AND DISTAL CLAVICLE EXCISION
Anesthesia: General | Site: Shoulder | Laterality: Left

## 2014-06-14 MED ORDER — HYDROMORPHONE HCL 1 MG/ML IJ SOLN: 0.5000 mg | INTRAMUSCULAR | Status: DC | PRN

## 2014-06-14 MED ORDER — PROPOFOL 10 MG/ML IV BOLUS
INTRAVENOUS | Status: DC | PRN
  Administered 2014-06-14: 180 mg via INTRAVENOUS

## 2014-06-14 MED ORDER — GLYCOPYRROLATE 0.2 MG/ML IJ SOLN
INTRAMUSCULAR | Status: DC | PRN
  Administered 2014-06-14: 0.6 mg via INTRAVENOUS

## 2014-06-14 MED ORDER — SODIUM CHLORIDE 0.9 % IR SOLN
Status: DC | PRN
  Administered 2014-06-14 (×2): 3000 mL

## 2014-06-14 MED ORDER — FENTANYL CITRATE 0.05 MG/ML IJ SOLN
INTRAMUSCULAR | Status: DC
Start: 2014-06-14 — End: 2014-06-14
  Filled 2014-06-14: qty 2

## 2014-06-14 MED ORDER — FENTANYL CITRATE 0.05 MG/ML IJ SOLN
INTRAMUSCULAR | Status: AC
  Filled 2014-06-14: qty 5

## 2014-06-14 MED ORDER — HYDROMORPHONE HCL 2 MG PO TABS: 2.0000 mg | ORAL_TABLET | ORAL | Status: DC | PRN

## 2014-06-14 MED ORDER — MIDAZOLAM HCL 2 MG/2ML IJ SOLN
INTRAMUSCULAR | Status: AC
  Filled 2014-06-14: qty 2

## 2014-06-14 MED ORDER — ROCURONIUM BROMIDE 50 MG/5ML IV SOLN
INTRAVENOUS | Status: AC
  Filled 2014-06-14: qty 1

## 2014-06-14 MED ORDER — MEPERIDINE HCL 25 MG/ML IJ SOLN: 6.2500 mg | INTRAMUSCULAR | Status: DC | PRN

## 2014-06-14 MED ORDER — FENTANYL CITRATE 0.05 MG/ML IJ SOLN
100.0000 ug | Freq: Once | INTRAMUSCULAR | Status: AC
  Administered 2014-06-14: 100 ug via INTRAVENOUS

## 2014-06-14 MED ORDER — PROMETHAZINE HCL 25 MG/ML IJ SOLN: 6.2500 mg | INTRAMUSCULAR | Status: DC | PRN

## 2014-06-14 MED ORDER — FENTANYL CITRATE 0.05 MG/ML IJ SOLN
INTRAMUSCULAR | Status: AC
  Administered 2014-06-14: 100 ug via INTRAVENOUS
  Filled 2014-06-14: qty 2

## 2014-06-14 MED ORDER — ROCURONIUM BROMIDE 100 MG/10ML IV SOLN
INTRAVENOUS | Status: DC | PRN
  Administered 2014-06-14: 50 mg via INTRAVENOUS

## 2014-06-14 MED ORDER — FENTANYL CITRATE 0.05 MG/ML IJ SOLN
INTRAMUSCULAR | Status: AC
  Filled 2014-06-14: qty 2

## 2014-06-14 MED ORDER — MIDAZOLAM HCL 5 MG/ML IJ SOLN: 1.0000 mg | Freq: Once | INTRAMUSCULAR | Status: AC

## 2014-06-14 MED ORDER — KETOROLAC TROMETHAMINE 30 MG/ML IJ SOLN
INTRAMUSCULAR | Status: AC
  Filled 2014-06-14: qty 1

## 2014-06-14 MED ORDER — ACETAMINOPHEN 10 MG/ML IV SOLN
1000.0000 mg | Freq: Four times a day (QID) | INTRAVENOUS | Status: DC
  Administered 2014-06-14: 1000 mg via INTRAVENOUS

## 2014-06-14 MED ORDER — DIAZEPAM 5 MG PO TABS: 2.5000 mg | ORAL_TABLET | Freq: Four times a day (QID) | ORAL | Status: DC | PRN

## 2014-06-14 MED ORDER — LIDOCAINE HCL (CARDIAC) 20 MG/ML IV SOLN
INTRAVENOUS | Status: DC | PRN
  Administered 2014-06-14: 100 mg via INTRAVENOUS

## 2014-06-14 MED ORDER — NALOXONE HCL 0.4 MG/ML IJ SOLN
INTRAMUSCULAR | Status: AC
  Filled 2014-06-14: qty 1

## 2014-06-14 MED ORDER — KETOROLAC TROMETHAMINE 30 MG/ML IJ SOLN
30.0000 mg | Freq: Once | INTRAMUSCULAR | Status: AC
  Administered 2014-06-14: 30 mg via INTRAVENOUS

## 2014-06-14 MED ORDER — ONDANSETRON HCL 4 MG/2ML IJ SOLN
INTRAMUSCULAR | Status: AC
  Filled 2014-06-14: qty 2

## 2014-06-14 MED ORDER — HYDROMORPHONE HCL 1 MG/ML IJ SOLN
INTRAMUSCULAR | Status: AC
  Filled 2014-06-14: qty 1

## 2014-06-14 MED ORDER — FENTANYL CITRATE 0.05 MG/ML IJ SOLN
25.0000 ug | INTRAMUSCULAR | Status: DC | PRN
  Administered 2014-06-14 (×3): 50 ug via INTRAVENOUS

## 2014-06-14 MED ORDER — FENTANYL CITRATE 0.05 MG/ML IJ SOLN: 25.0000 ug | INTRAMUSCULAR | Status: DC | PRN

## 2014-06-14 MED ORDER — SCOPOLAMINE 1 MG/3DAYS TD PT72
1.0000 | MEDICATED_PATCH | TRANSDERMAL | Status: DC
  Administered 2014-06-14: 1.5 mg via TRANSDERMAL

## 2014-06-14 MED ORDER — FENTANYL CITRATE 0.05 MG/ML IJ SOLN
INTRAMUSCULAR | Status: DC | PRN
  Administered 2014-06-14: 50 ug via INTRAVENOUS
  Administered 2014-06-14: 100 ug via INTRAVENOUS

## 2014-06-14 MED ORDER — ONDANSETRON HCL 4 MG/2ML IJ SOLN
INTRAMUSCULAR | Status: DC | PRN
  Administered 2014-06-14: 4 mg via INTRAVENOUS

## 2014-06-14 MED ORDER — EPHEDRINE SULFATE 50 MG/ML IJ SOLN
INTRAMUSCULAR | Status: DC | PRN
  Administered 2014-06-14: 10 mg via INTRAVENOUS

## 2014-06-14 MED ORDER — MIDAZOLAM HCL 2 MG/2ML IJ SOLN
1.0000 mg | Freq: Once | INTRAMUSCULAR | Status: AC
  Administered 2014-06-14: 1 mg via INTRAVENOUS

## 2014-06-14 MED ORDER — LIDOCAINE HCL (CARDIAC) 20 MG/ML IV SOLN
INTRAVENOUS | Status: AC
  Filled 2014-06-14: qty 5

## 2014-06-14 MED ORDER — PROPOFOL 10 MG/ML IV BOLUS
INTRAVENOUS | Status: AC
  Filled 2014-06-14: qty 20

## 2014-06-14 MED ORDER — SCOPOLAMINE 1 MG/3DAYS TD PT72
MEDICATED_PATCH | TRANSDERMAL | Status: AC
  Filled 2014-06-14: qty 1

## 2014-06-14 MED ORDER — ACETAMINOPHEN 10 MG/ML IV SOLN
INTRAVENOUS | Status: AC
  Filled 2014-06-14: qty 100

## 2014-06-14 MED ORDER — PROMETHAZINE HCL 25 MG/ML IJ SOLN
6.2500 mg | INTRAMUSCULAR | Status: DC | PRN
Start: 2014-06-14 — End: 2014-06-14

## 2014-06-14 MED ORDER — NEOSTIGMINE METHYLSULFATE 10 MG/10ML IV SOLN
INTRAVENOUS | Status: DC | PRN
  Administered 2014-06-14: 4 mg via INTRAVENOUS

## 2014-06-14 MED ORDER — BUPIVACAINE-EPINEPHRINE (PF) 0.5% -1:200000 IJ SOLN
INTRAMUSCULAR | Status: DC | PRN
  Administered 2014-06-14: 22 mL via PERINEURAL

## 2014-06-14 MED ORDER — MIDAZOLAM HCL 2 MG/2ML IJ SOLN
INTRAMUSCULAR | Status: AC
Start: 2014-06-14 — End: 2014-06-14
  Administered 2014-06-14: 1 mg
  Filled 2014-06-14: qty 2

## 2014-06-14 MED ORDER — DEXMEDETOMIDINE HCL IN NACL 200 MCG/50ML IV SOLN
INTRAVENOUS | Status: AC
  Filled 2014-06-14: qty 50

## 2014-06-14 MED ORDER — DEXMEDETOMIDINE HCL IN NACL 80 MCG/20ML IV SOLN
20.0000 ug | INTRAVENOUS | Status: AC
  Administered 2014-06-14 (×3): 8 ug via INTRAVENOUS
  Administered 2014-06-14: 10 ug via INTRAVENOUS
  Filled 2014-06-14: qty 1

## 2014-06-14 SURGICAL SUPPLY — 65 items
BLADE CUTTER GATOR 3.5 (BLADE) ×3 IMPLANT
BLADE GREAT WHITE 4.2 (BLADE) ×2 IMPLANT
BLADE GREAT WHITE 4.2MM (BLADE) ×1
BLADE SURG 11 STRL SS (BLADE) ×3 IMPLANT
BOOTCOVER CLEANROOM LRG (PROTECTIVE WEAR) ×6 IMPLANT
BUR 3.5 LG SPHERICAL (BURR) IMPLANT
BUR OVAL 4.0 (BURR) ×3 IMPLANT
BURR 3.5 LG SPHERICAL (BURR)
BURR 3.5MM LG SPHERICAL (BURR)
CANISTER SUCT LVC 12 LTR MEDI- (MISCELLANEOUS) ×3 IMPLANT
CANNULA ACUFLEX KIT 5X76 (CANNULA) ×3 IMPLANT
CANNULA DRILOCK 5.0MMX75MM (CANNULA)
CANNULA DRILOCK 5.0X75 (CANNULA) IMPLANT
CLOSURE STERI-STRIP 1/2X4 (GAUZE/BANDAGES/DRESSINGS) ×1
CLOSURE WOUND 1/2 X4 (GAUZE/BANDAGES/DRESSINGS) ×1
CLSR STERI-STRIP ANTIMIC 1/2X4 (GAUZE/BANDAGES/DRESSINGS) ×1 IMPLANT
CONNECTOR 5 IN 1 STRAIGHT STRL (MISCELLANEOUS) ×3 IMPLANT
DRAPE INCISE 23X17 IOBAN STRL (DRAPES) ×2
DRAPE INCISE 23X17 STRL (DRAPES) ×1 IMPLANT
DRAPE INCISE IOBAN 23X17 STRL (DRAPES) ×1 IMPLANT
DRAPE ORTHO SPLIT 77X108 STRL (DRAPES) ×6
DRAPE STERI 35X30 U-POUCH (DRAPES) ×2 IMPLANT
DRAPE SURG 17X11 SM STRL (DRAPES) ×3 IMPLANT
DRAPE SURG ORHT 6 SPLT 77X108 (DRAPES) ×2 IMPLANT
DRSG PAD ABDOMINAL 8X10 ST (GAUZE/BANDAGES/DRESSINGS) ×4 IMPLANT
DURAPREP 26ML APPLICATOR (WOUND CARE) ×4 IMPLANT
ELECT REM PT RETURN 9FT ADLT (ELECTROSURGICAL) ×3
ELECTRODE REM PT RTRN 9FT ADLT (ELECTROSURGICAL) ×1 IMPLANT
GAUZE SPONGE 4X4 12PLY STRL (GAUZE/BANDAGES/DRESSINGS) ×3 IMPLANT
GLOVE BIO SURGEON STRL SZ7.5 (GLOVE) ×3 IMPLANT
GLOVE BIO SURGEON STRL SZ8 (GLOVE) ×3 IMPLANT
GLOVE EUDERMIC 7 POWDERFREE (GLOVE) ×3 IMPLANT
GLOVE SS BIOGEL STRL SZ 7.5 (GLOVE) ×1 IMPLANT
GLOVE SUPERSENSE BIOGEL SZ 7.5 (GLOVE) ×2
GOWN STRL REUS W/ TWL LRG LVL3 (GOWN DISPOSABLE) ×1 IMPLANT
GOWN STRL REUS W/ TWL XL LVL3 (GOWN DISPOSABLE) ×4 IMPLANT
GOWN STRL REUS W/TWL LRG LVL3 (GOWN DISPOSABLE) ×6
GOWN STRL REUS W/TWL XL LVL3 (GOWN DISPOSABLE) ×6
KIT BASIN OR (CUSTOM PROCEDURE TRAY) ×3 IMPLANT
KIT ROOM TURNOVER OR (KITS) ×3 IMPLANT
KIT SHOULDER TRACTION (DRAPES) ×3 IMPLANT
MANIFOLD NEPTUNE II (INSTRUMENTS) ×3 IMPLANT
NDL SPNL 18GX3.5 QUINCKE PK (NEEDLE) ×1 IMPLANT
NDL SUT 6 .5 CRC .975X.05 MAYO (NEEDLE) IMPLANT
NEEDLE MAYO TAPER (NEEDLE)
NEEDLE SPNL 18GX3.5 QUINCKE PK (NEEDLE) ×6 IMPLANT
NS IRRIG 1000ML POUR BTL (IV SOLUTION) ×1 IMPLANT
PACK SHOULDER (CUSTOM PROCEDURE TRAY) ×3 IMPLANT
PAD ARMBOARD 7.5X6 YLW CONV (MISCELLANEOUS) ×6 IMPLANT
SET ARTHROSCOPY TUBING (MISCELLANEOUS) ×3
SET ARTHROSCOPY TUBING LN (MISCELLANEOUS) ×1 IMPLANT
SLING ARM FOAM STRAP LRG (SOFTGOODS) ×2 IMPLANT
SLING ARM LRG ADULT FOAM STRAP (SOFTGOODS) IMPLANT
SLING ARM MED ADULT FOAM STRAP (SOFTGOODS) ×1 IMPLANT
SPONGE LAP 4X18 X RAY DECT (DISPOSABLE) ×3 IMPLANT
STRIP CLOSURE SKIN 1/2X4 (GAUZE/BANDAGES/DRESSINGS) ×2 IMPLANT
SUT MNCRL AB 3-0 PS2 18 (SUTURE) ×3 IMPLANT
SUT PDS AB 0 CT 36 (SUTURE) IMPLANT
SUT RETRIEVER GRASP 30 DEG (SUTURE) IMPLANT
SYR 20CC LL (SYRINGE) ×3 IMPLANT
TAPE PAPER 3X10 WHT MICROPORE (GAUZE/BANDAGES/DRESSINGS) ×3 IMPLANT
TOWEL OR 17X24 6PK STRL BLUE (TOWEL DISPOSABLE) ×5 IMPLANT
TOWEL OR 17X26 10 PK STRL BLUE (TOWEL DISPOSABLE) ×3 IMPLANT
WAND SUCTION MAX 4MM 90S (SURGICAL WAND) ×3 IMPLANT
WATER STERILE IRR 1000ML POUR (IV SOLUTION) ×1 IMPLANT

## 2014-06-14 NOTE — Addendum Note (Signed)
Addendum  created 06/14/14 1537 by Jodell CiproSarah A Kendallyn Lippold, CRNA   Modules edited: Anesthesia Flowsheet, Anesthesia Medication Administration

## 2014-06-14 NOTE — Anesthesia Preprocedure Evaluation (Addendum)
Anesthesia Evaluation  Patient identified by MRN, date of birth, ID band Patient awake    Reviewed: Allergy & Precautions, H&P , NPO status , Patient's Chart, lab work & pertinent test results  History of Anesthesia Complications (+) Emergence Delirium and history of anesthetic complications  Airway Mallampati: II   Neck ROM: Full    Dental  (+) Teeth Intact   Pulmonary former smoker (quit 2005),  breath sounds clear to auscultation        Cardiovascular hypertension, Pt. on medications + CAD and + Past MI Rhythm:Regular  Stent to LAD patent, sees cardiologist regularly, exercises daily, ECHO 2012 with normal LV function   Neuro/Psych    GI/Hepatic hiatal hernia, GERD-  ,  Endo/Other    Renal/GU      Musculoskeletal   Abdominal (+)  Abdomen: soft.    Peds  Hematology   Anesthesia Other Findings   Reproductive/Obstetrics                         Anesthesia Physical Anesthesia Plan  ASA: III  Anesthesia Plan: General   Post-op Pain Management:    Induction: Intravenous  Airway Management Planned: Oral ETT  Additional Equipment:   Intra-op Plan:   Post-operative Plan: Extubation in OR  Informed Consent: I have reviewed the patients History and Physical, chart, labs and discussed the procedure including the risks, benefits and alternatives for the proposed anesthesia with the patient or authorized representative who has indicated his/her understanding and acceptance.     Plan Discussed with:   Anesthesia Plan Comments: (Hx emergence delirium and combatted behavior, precedex 40-12600mcg during procedure would be helpful, give i  Small boluses if tolerated)        Anesthesia Quick Evaluation

## 2014-06-14 NOTE — H&P (Signed)
Vickey Hugerarl D Cannaday    Chief Complaint: LEFT SHOULDER LABRAL CYST AND OA  HPI: The patient is a 68 y.o. 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 Hypertension   . Shortness of breath   . Depression   . Coronary artery disease   . GERD (gastroesophageal reflux disease)   . Arthritis   . Anxiety   . Neuromuscular disorder     hx of shingles/ myofacitis  . Cervical disc disease 07/20/2011  . Headache(784.0)     years ago  . Myocardial infarct     2001  sees Dr. York SpanielS Tilley  . Elevated cholesterol   . Baker's cyst of knee     left  . History of hiatal hernia   . Complication of anesthesia     " I wake up in a combative mode"    Past Surgical History  Procedure Laterality Date  . Shoulder surgery      twice  left shoulder  . Laminectomy and microdiscectomy cervical spine      January and August 2010  . Coronary angioplasty      x 2  last one 07/2011  . Shoulder arthroscopy with labral repair Left 12/01/2012    Procedure: LEFT SHOULDER ARTHROSCOPY WITH ASPIRATION OF DEGENERATIVE OF THE PARALABRAL CYST and OPEN DECOMPRESSION OF SUPRASCAPULAR NERVE;  Surgeon: Senaida LangeKevin M Aijalon Kirtz, MD;  Location: [facility] OR;  Service: Orthopedics;  Laterality: Left;  . Coronary stent placement  2001    Tetra Multi Link Stent-3T MRI compatible, normal mode  . Colonoscopy      History reviewed. No pertinent family history.  Social History:  reports that he quit smoking about 9 years ago. His smoking use included Cigarettes. He has a 36 pack-year smoking history. He has never used smokeless tobacco. He reports that he drinks alcohol. He reports that he does not use illicit drugs.  Allergies:  Allergies  Allergen Reactions  . Sulfa Antibiotics Other (See Comments)    Reaction unknown  . Tetanus Toxoids Other (See Comments)    Reaction unknown    Medications Prior to Admission   Medication Sig Dispense Refill  . amLODipine (NORVASC) 5 MG tablet Take 5 mg by mouth daily.      Marland Kitchen. aspirin EC 81 MG tablet Take 81 mg by mouth daily.      Marland Kitchen. atorvastatin (LIPITOR) 40 MG tablet Take 40 mg by mouth daily.    Marland Kitchen. escitalopram (LEXAPRO) 20 MG tablet Take 20 mg by mouth daily.    Marland Kitchen. esomeprazole (NEXIUM) 40 MG capsule Take 40 mg by mouth daily before breakfast.    . fentaNYL (DURAGESIC - DOSED MCG/HR) 100 MCG/HR Place 100 mcg onto the skin every 3 (three) days.    . folic acid (FOLVITE) 800 MCG tablet Take 800 mcg by mouth daily.      Marland Kitchen. gabapentin (NEURONTIN) 300 MG capsule Take 300 mg by mouth daily.    Marland Kitchen. ibuprofen (ADVIL,MOTRIN) 200 MG tablet Take 600 mg by mouth daily as needed for moderate pain.     Marland Kitchen. oxyCODONE-acetaminophen (PERCOCET) 10-325 MG per tablet Take 2 tablets by mouth every 4 (four) hours as needed for pain.    . ramipril (ALTACE) 10 MG capsule Take 10 mg by mouth daily.      Marland Kitchen. tiotropium (SPIRIVA) 18 MCG inhalation capsule Place 1 capsule (18 mcg total) into inhaler and inhale  daily. 30 capsule 6  . escitalopram (LEXAPRO) 10 MG tablet Take 15 mg by mouth daily.     . fentaNYL (DURAGESIC - DOSED MCG/HR) 75 MCG/HR Place 1 patch onto the skin every 3 (three) days.    . nitroGLYCERIN (NITROSTAT) 0.4 MG SL tablet Place 0.4 mg under the tongue every 5 (five) minutes as needed.      Marland Kitchen. rOPINIRole (REQUIP) 2 MG tablet Take 3 mg by mouth at bedtime.        Physical Exam: left shoulder with painful and restricted motion as noted at recent office visits  Vitals  Pulse Rate:  [58-66] 64 (11/05 1150) Resp:  [12-18] 16 (11/05 1150) BP: (144-155)/(58-86) 144/58 mmHg (11/05 1150) SpO2:  [96 %-99 %] 96 % (11/05 1150)  Assessment/Plan  Impression: LEFT SHOULDER LABRAL CYST AND OA   Plan of Action: Procedure(s): LEFT SHOULDER ARTHROSCOPY WITH LABRAL REPAIR EVACUATION OF PARA CYST SUBACROMIAL DECOMPRESSION AND DISTAL CLAVICLE RESECTION   Jet Armbrust M 06/14/2014, 12:02  PM

## 2014-06-14 NOTE — Anesthesia Procedure Notes (Addendum)
Anesthesia Regional Block:  Supraclavicular block  Pre-Anesthetic Checklist: ,, timeout performed, Correct Patient, Correct Site, Correct Laterality, Correct Procedure, Correct Position, site marked, Risks and benefits discussed,  Surgical consent,  Pre-op evaluation,  At surgeon's request and post-op pain management  Laterality: Left  Prep: Maximum Sterile Barrier Precautions used and chloraprep       Needles:   Needle Type: Echogenic Stimulator Needle     Needle Length: 9cm 9 cm Needle Gauge: 22 and 22 G    Additional Needles: Supraclavicular block Narrative:  Start time: 06/14/2014 11:40 AM End time: 06/14/2014 11:51 AM Injection made incrementally with aspirations every 5 mL.  Additional Notes: L supra clavicular block, US, Stimulator .6ma, 22cc .5% marcaine with epi, multiple asp, talked throughout, sterile   Procedure Name: Intubation Date/Time: 06/14/2014 12:45 PM Performed by: Sharlene DoryWALKER, Rindy Kollman E Pre-anesthesia Checklist: Patient identified, Emergency Drugs available, Suction available, Patient being monitored and Timeout performed Patient Re-evaluated:Patient Re-evaluated prior to inductionOxygen Delivery Method: Circle system utilized Preoxygenation: Pre-oxygenation with 100% oxygen Intubation Type: IV induction Ventilation: Mask ventilation without difficulty Laryngoscope Size: Mac and 4 Grade View: Grade I Tube type: Oral Tube size: 7.5 mm Number of attempts: 1 Airway Equipment and Method: Stylet Placement Confirmation: ETT inserted through vocal cords under direct vision,  positive ETCO2 and breath sounds checked- equal and bilateral Secured at: 22 cm Tube secured with: Tape Dental Injury: Teeth and Oropharynx as per pre-operative assessment  Comments: Head and neck remained in neutral cervical alignment for intubation.

## 2014-06-14 NOTE — Op Note (Signed)
06/14/2014  2:12 PM  PATIENT:   Vickey Hugerarl D Havel  68 y.o. male  PRE-OPERATIVE DIAGNOSIS:  LEFT SHOULDER PARA-LABRAL CYST AND AC JOINT OA   POST-OPERATIVE DIAGNOSIS:  SAME WITH GLENOHUMERAL OA AND PARTIAL ROTATOR CUFF TEAR  PROCEDURE:  LSA, removal loose bodies, chondroplasty, labral debridement, evacuation and decompression of paralabral cyst, SAD, DCR. Debridement of bursal surface partial rotator cuff tear  SURGEON:  Alexx Giambra, Vania ReaKevin M. M.D.  ASSISTANTS: Shuford pac   ANESTHESIA:   GET + ISB  EBL: min  SPECIMEN:  none  Drains: none   PATIENT DISPOSITION:  PACU - hemodynamically stable.    PLAN OF CARE: Discharge to home after PACU  Dictation# [id number]

## 2014-06-14 NOTE — Anesthesia Postprocedure Evaluation (Signed)
  Anesthesia Post-op Note  Patient: Clayton Anderson  Procedure(s) Performed: Procedure(s): LEFT SHOULDER ARTHROSCOPY WITH LABRAL REPAIR EVACUATION OF PARA CYST SUBACROMIAL DECOMPRESSION AND DISTAL CLAVICLE RESECTION  (Left)  Patient Location: PACU  Anesthesia Type:General  Level of Consciousness: awake, alert  and oriented  Airway and Oxygen Therapy: Patient Spontanous Breathing and Patient connected to nasal cannula oxygen  Post-op Pain: moderate  Post-op Assessment: Post-op Vital signs reviewed, Patient's Cardiovascular Status Stable, Respiratory Function Stable, Patent Airway and No signs of Nausea or vomiting  Post-op Vital Signs: Reviewed and stable  Last Vitals:  Filed Vitals:   06/14/14 1452  BP: 162/80  Pulse: 61  Temp:   Resp: 17    Complications: No apparent anesthesia complications

## 2014-06-14 NOTE — Discharge Instructions (Signed)
° °  Kevin M. Supple, M.D., F.A.A.O.S. °Orthopaedic Surgery °Specializing in Arthroscopic and Reconstructive °Surgery of the Shoulder and Knee °[phone] °[address] - [place], [place] [address] - Fax [phone] ° ° °POST-OP SHOULDER ARTHROSCOPY INSTRUCTIONS ° °1. Call the office at [phone] to schedule your first post-op appointment 7-10 days from the date of your surgery. ° °2. Leave the steri-strips in place over your incisions when performing dressing changes and showering. You may remove your dressings and begin showering 72 hours from surgery. You can expect drainage that is clear to bloody in nature that occasionally will soak through your dressings. If this occurs go ahead and perform a dressing change. The drainage should lessen daily and when there is no drainage from your incisions feel free to go without a dressing. ° °3. Wear your sling for comfort. You may come out of your sling for ad lib activity and even decide not to use the sling at all. If you find you are more comfortable in your sling, make sure you come out of your sling at least 3-4 times a day to do the exercises that are included below. ° °4. Range of motion to your elbow, wrist, and hand are encouraged 3-5 times daily. Exercise to your hand and fingers helps to reduce swelling you may experience. ° °5. Utilize ice to the shoulder 3-4 times minimum a day and additionally if you are experiencing pain. ° °6. You may drive when safely off narcotics and muscle relaxants. ° °7. If you had a block pre-operatively to provide post-op pain relief you may want to go ahead and begin utilizing your pain meds as your arm begins to wake up. Blocks can sometimes last up to 16-18 hours. If you are still pain-free prior to going to bed you may want to strongly consider taking a pain medication to avoid being awakened in the night with the onset of pain. A muscle relaxant is also provided for you should you experience muscle spasms. It  is recommended that if you are experiencing pain that your pain medication alone is not controlling, add the muscle relaxant along with the pain medication which can give additional pain relief. The first one to two days is generally the most severe of your pain and then should gradually decrease. As your pain lessens it is recommended that you decrease your use of the pain medications to an "as needed basis" only and to always comply with the recommended dosages of the pain medications. ° °8. Pain medications can produce constipation along with their use. If you experience this, the use of an over the counter stool softener or laxative daily is recommended.  ° °9. For additional questions or concerns, please do not hesitate to call the office. If after hours there is an answering service to forward your concerns to the physician on call. ° ° °POST-OP EXERCISES ° °The pendulum exercises should be performed while bending at the waist as far over as possible thereby letting gravity do the work for you. ° °Range of Motion Exercises: Pendulum (circular) ° °Repeat 20 times. Do 3 sessions per day. ° ° ° ° °Range of Motion Exercises: Pendulum (side-to-side) ° °Repeat 20 times. Do 3 sessions per day. ° ° ° °Range of Motion Exercises (self-stretching activities): ° °Slide arm up wall with palm toward you, moving closer to the wall. Hold for 5 seconds. ° °Repeat 10 times. Do 3 sessions per day. ° ° ° ° ° ° °

## 2014-06-14 NOTE — Transfer of Care (Signed)
Immediate Anesthesia Transfer of Care Note  Patient: Clayton Anderson  Procedure(s) Performed: Procedure(s): LEFT SHOULDER ARTHROSCOPY WITH LABRAL REPAIR EVACUATION OF PARA CYST SUBACROMIAL DECOMPRESSION AND DISTAL CLAVICLE RESECTION  (Left)  Patient Location: PACU  Anesthesia Type:General  Level of Consciousness: awake, alert  and oriented  Airway & Oxygen Therapy: Patient Spontanous Breathing and Patient connected to nasal cannula oxygen  Post-op Assessment: Report given to PACU RN, Post -op Vital signs reviewed and stable and Patient moving all extremities X 4  Post vital signs: Reviewed and stable  Complications: No apparent anesthesia complications

## 2014-06-14 NOTE — Op Note (Signed)
NAME:  Clayton Anderson, Clayton Anderson                   ACCOUNT NO.:  [id number]  MEDICAL RECORD NO.:  [id number]  LOCATION:  [facility]                         FACILITY:  [facility]  PHYSICIAN:  Vania ReaKevin M. Joelle Roswell, M.D.  DATE OF BIRTH:  [birth year]  DATE OF PROCEDURE:  06/14/2014 DATE OF DISCHARGE:  06/14/2014                              OPERATIVE REPORT   PREOPERATIVE DIAGNOSIS:  Chronic left shoulder pain with radiographic evidence [facility] degenerative changes as well as a large paralabral cyst as well as rotator cuff tendinosis.  POSTOPERATIVE DIAGNOSES: 1. Left shoulder impingement syndrome. 2. Partial bursal surface rotator cuff tear. 3. Glenohumeral arthrosis. 4. AC joint arthrosis. 5. Degenerative labral tear. 6. Complex paralabral cyst. 7. Cartilaginous loose bodies in glenohumeral joint.  PROCEDURE: 1. Left shoulder examination under anesthesia. 2. Left shoulder diagnostic arthroscopy. 3. Removal of cartilaginous loose bodies. 4. Chondroplasty of the humeral head and glenoid. 5. Debridement of degenerative labral tear circumferential. 6. Evacuation and decompression of complex paralabral cyst in the     supraspinatus fossa. 7. Arthroscopic subacromial decompression and bursectomy. 8. Arthroscopic distal clavicle resection. 9. Debridement of partial bursal surface rotator cuff tear.  SURGEON:  Vania ReaKevin M. Keslee Harrington, M.D.  Threasa HeadsASSISTANFrench Ana:  Tracy A. Shuford, P.A.-C.  ANESTHESIA:  General endotracheal as well as interscalene block.  ESTIMATED BLOOD LOSS:  Minimal.  DRAINS:  None.  HISTORY:  Mr.  Clayton Anderson is a 68 year old gentleman who has had chronic left shoulder pain with multiple previous left shoulder surgeries, most recently several years ago by myself at which time, we performed evacuation of the paralabral abscess and he unfortunately had a recurrence of shoulder pain over the last year or so, significantly worse over the last several months.  A subsequent MRI scan showed a reaccumulation of the  paralabral cyst although in addition he has noted significant rotator cuff tendinosis and some AC joint degenerative change as well as some early glenohumeral chondrosis.  Due to his ongoing pain, functional limitations, and failure to respond to conservative management, he is brought to the operative room at this time for planned left shoulder arthroscopy as described below.  I preoperatively counseled Clayton Anderson regarding treatment options and risks versus benefits thereof.  Possible surgical complications were all reviewed with potential for bleeding, infection, neurovascular injury, persistent pain, loss of motion, anesthetic complication, and possible need for additional surgery including potential for recurrence of paralabral cyst.  He understands and accepts and agrees with our planned procedure.  PROCEDURE IN DETAIL:  After undergoing routine preoperative evaluation, the patient received prophylactic antibiotics.  An interscalene block was established in the holding area by the Anesthesia department. Placed supine on operative table and underwent smooth induction of a general endotracheal anesthesia.  Turned to the right lateral decubitus position on a beanbag and appropriately padded and protected.  Left shoulder examination under anesthesia revealed full motion and no instability patterns were noted.  Left arm was then suspended at 70 degrees abduction with 10 pounds of traction.  Left shoulder girdle region was sterilely prepped and draped in standard fashion.  Time-out was called.  Posterior portal was established in the glenohumeral joint and anterior portal was established under  direct visualization.  The articular surfaces showed diffuse chondromalacia grade 2 and 3 of the humeral head, grade 3 diffusely in the glenoid, and there is an area of full-thickness cartilage loss over the humeral head centrally approximately 1 cm in diameter region.  [name] was then introduced  and used to debride the frayed cartilage to a stable base.  There was degenerative tearing of the labrum anteriorly, posteriorly, and superiorly and evidence for prior anterior labral repair with some suture material visible from beneath the anterior labrum and this was debrided with a shaver.  I did not appreciate any instability patterns to the biceps.  Anchor was stable.  Biceps tendon was in normal caliber although with some bicipital tenosynovitis.  We carefully probed the posterior labrum which is where the paralabral cyst was thought to have originated although it was diffusely degenerated.  I did not identify an obvious discrete orifice leading into a paralabral ganglion.  At this point, I went ahead and divided the capsule tissues at the margin of the glenoid to gain access to the supraspinatus fossa dissection beneath the supraspinatus and passed an 18-gauge needle, did evacuate some gelatinous material and then went ahead and opened up the tissues with combination of blunt and sharp dissection to fully elevate the tissues of the supraspinatus fossa to evacuate the area and decompress this region and divided any soft tissue attachments that may have served as a good stalk input for egress of fluid from the joint into the region. This area was completely opened and divided.  At this point, the Stryker wand was then used to obtain hemostasis.  At this point, irrigation and debridement was completed at the glenohumeral joint.  I should mention we carefully inspected the rotator cuff which showed some minimal articular sided fraying but no obvious full-thickness defect.  The fluid instrument was then removed.  The arm dropped down to 30 degrees of abduction with the arthroscope introduced in the subacromial space to the posterior portal and direct lateral portal established in the subacromial space.  Abundant dense bursal tissue and multiple adhesions were encountered and these were  all divided and excised with combination of shaver and Arthrex wand.  The wand was then used to remove the periosteum from the undersurface of the anterior half of the acromion and then a subacromial decompression was performed with a burr creating a type 1 morphology.  Portal was then established directly anterior to the distal clavicle and the distal clavicle did show significant synovitis of the surrounding soft tissues.  It was unclear whether there may have been some prior at least partial resection of the distal clavicle.  Certainly, there was some eburnated bone and significant inflammation, so we went ahead and introduced a burr and shaved and removed approximately 5-8 mm of the remaining distal clavicle.  I should mention that the distal clavicle did appear to be significantly mobile in relation to the acromion.  The area was nicely decompressed and then hemostasis was obtained with Stryker wand.  At this point, we then completed the subacromial/subdeltoid bursectomy.  Inspection of bursal surface of the rotator cuff showed some retained suture Stax from a previous rotator cuff repair and we did carefully probe the rotator cuff, and while there were certainly some attenuation and fraying and degeneration on the bursal side, again, I did not identify any obvious full-thickness defects and so we completed the limited debridement of that area and obtained hemostasis.  Bursectomy was completed.  Fluid instrument was removed.  The portals were closed with  Monocryl and Steri-Strips.  Dry dressing taped at the left shoulder.  Left arm was placed in a sling.  The patient was awakened, extubated, and taken to recovery room in stable condition.  Ralene Bathe, PA-C was used as an Geophysicist/field seismologist throughout this case, essential for help with positioning the patient, positioning the extremity, management of the arthroscopic equipment, tissue manipulation, wound closure, and  intraoperative-decision making.     Vania Rea. Melayna Robarts, M.D.     KMS/MEDQ  D:  06/14/2014  T:  06/14/2014  Job:  [id number]

## 2014-06-22 DIAGNOSIS — G894 Chronic pain syndrome: Secondary | ICD-10-CM | POA: Insufficient documentation

## 2014-06-22 DIAGNOSIS — M542 Cervicalgia: Secondary | ICD-10-CM | POA: Insufficient documentation

## 2014-06-22 DIAGNOSIS — M961 Postlaminectomy syndrome, not elsewhere classified: Secondary | ICD-10-CM | POA: Insufficient documentation

## 2014-06-22 DIAGNOSIS — M47812 Spondylosis without myelopathy or radiculopathy, cervical region: Secondary | ICD-10-CM | POA: Insufficient documentation

## 2014-07-18 ENCOUNTER — Encounter (HOSPITAL_COMMUNITY): Payer: Self-pay | Admitting: Cardiology

## 2015-10-14 ENCOUNTER — Encounter: Payer: 59 | Admitting: Internal Medicine

## 2015-10-24 ENCOUNTER — Encounter: Payer: Self-pay | Admitting: Pain Medicine

## 2015-10-24 ENCOUNTER — Ambulatory Visit: Payer: Medicare Other | Attending: Pain Medicine | Admitting: Pain Medicine

## 2015-10-24 VITALS — BP 143/87 | HR 66 | Temp 98.5°F | Resp 16 | Ht 69.0 in | Wt 200.0 lb

## 2015-10-24 DIAGNOSIS — Z9889 Other specified postprocedural states: Secondary | ICD-10-CM | POA: Diagnosis not present

## 2015-10-24 DIAGNOSIS — K219 Gastro-esophageal reflux disease without esophagitis: Secondary | ICD-10-CM | POA: Insufficient documentation

## 2015-10-24 DIAGNOSIS — M1990 Unspecified osteoarthritis, unspecified site: Secondary | ICD-10-CM | POA: Insufficient documentation

## 2015-10-24 DIAGNOSIS — Z981 Arthrodesis status: Secondary | ICD-10-CM | POA: Insufficient documentation

## 2015-10-24 DIAGNOSIS — F418 Other specified anxiety disorders: Secondary | ICD-10-CM | POA: Diagnosis not present

## 2015-10-24 DIAGNOSIS — I10 Essential (primary) hypertension: Secondary | ICD-10-CM | POA: Diagnosis not present

## 2015-10-24 DIAGNOSIS — R51 Headache: Secondary | ICD-10-CM | POA: Diagnosis present

## 2015-10-24 DIAGNOSIS — R5382 Chronic fatigue, unspecified: Secondary | ICD-10-CM | POA: Insufficient documentation

## 2015-10-24 DIAGNOSIS — G2581 Restless legs syndrome: Secondary | ICD-10-CM | POA: Insufficient documentation

## 2015-10-24 DIAGNOSIS — M797 Fibromyalgia: Secondary | ICD-10-CM | POA: Diagnosis not present

## 2015-10-24 DIAGNOSIS — Z7982 Long term (current) use of aspirin: Secondary | ICD-10-CM | POA: Diagnosis not present

## 2015-10-24 DIAGNOSIS — I252 Old myocardial infarction: Secondary | ICD-10-CM | POA: Diagnosis not present

## 2015-10-24 DIAGNOSIS — M47812 Spondylosis without myelopathy or radiculopathy, cervical region: Secondary | ICD-10-CM

## 2015-10-24 DIAGNOSIS — M5030 Other cervical disc degeneration, unspecified cervical region: Secondary | ICD-10-CM | POA: Insufficient documentation

## 2015-10-24 DIAGNOSIS — M5481 Occipital neuralgia: Secondary | ICD-10-CM | POA: Diagnosis not present

## 2015-10-24 DIAGNOSIS — M542 Cervicalgia: Secondary | ICD-10-CM | POA: Diagnosis present

## 2015-10-24 NOTE — Progress Notes (Signed)
Subjective:    Patient ID: Clayton Anderson, male    DOB: 10/15/1945, 70 y.o.   MRN: [id number]  HPI  The patient is a 70 year old gentleman who comes to pain management center at the request of Clayton Anderson for further evaluation and treatment of pain involving the neck upper extremity regions, and headaches. The patient states that he has had pain for significant period of time and is undergone surgical intervention of the cervical region performed by Clayton Anderson as well as prior interventional treatment in pain management The patient states that the pain is somewhat improved with medications consisting of panic ER with oxycodone for breakthrough pain. The patient also takes Parafon forte. The patient stated that the panic ER was 40 mg every 12 hours. The patient stated that he was taking oxycodone 10 mg limited 6 per day for breakthrough pain. Assess patient's condition including interventional treatment and informed patient that we would have patient return for reevaluation in a few weeks. We also discussed patient increasing his Neurontin which patient was taking for his restless leg syndrome. The informed patient that the Neurontin may provide significant improvement of pain. The patient stated that by medications have included Lyrica and Cymbalta. Unable to recall names of the medications which she had taken. We informed patient that we will consider modification of medications as well as interventional treatment pending follow-up evaluation. The patient was understanding and agreed to suggested treatment plan. The patient denied recent trauma change in events of daily living the call significant change in symptomatology.. The patient described his pain as aching agonizing Crowell deep disabling distressing dreadful sharp shooting stabbing sickening stabbing tender throbbing toothache-like sensation that awakens patient from sleep and interfered the patient ability to go to sleep. The patient stated  that his pain increased with sitting standing squatting stooping twisting walking and the surgery made the pain worse. The patient stated that the pain had decreased with sleeping and lying down medications and nerve blocks.       Review of Systems     Cardiovascular: High blood pressure Daily aspirin intake Prior myocardial infarction Prior heart catheterization  Pulmonary: Anxiety Depression  Neurological: Unremarkable  Psychological: Unremarkable  Gastrointestinal: Hiatal hernia Gastroesophageal reflux disease  Genitourinary: Unremarkable  Hematologic: Unremarkable  Endocrine: Unremarkable  Rheumatological: Fibromyalgia  Osteoarthritis Chronic fatigue syndrome   Musculoskeletal: Unremarkable  Other significant: Unremarkable      Objective:   Physical Exam There was tenderness of the splenius capitis and occipitalis musculature regions of moderate degree. Palpation of the cervical region cervical facet region was with moderate tenderness to palpation. There was well-healed surgical scars of the cervical region without increased warmth and erythema in the region scar is. There was limited range of motion of the cervical spine. The patient appeared to be with unremarkable Spurling's maneuver. There was tenderness over the region of the thoracic facet thoracic paraspinal musculature region with moderate to moderately severe muscle spasms in the upper thoracic region. Palpation over the trapezius levator scapula and rhomboid musculature region was with moderate to moderately severe tenderness to palpation. Palpation of the acromioclavicular and glenohumeral joint region was attends to palpation of moderate degree with some limited range of motion of the shoulder noted. (Patient is status post surgery of left shoulder). There was tenderness over the lumbar paraspinal misreading lumbar facet region of mild to moderate degree. Lateral bending rotation extension and  palpation of the lumbar facets reproduce mild to moderate discomfort. There was mild tenderness of  the PSIS and PII S region as well as the gluteal and piriformis musculature region. There was mild tenderness of the greater trochanteric region and iliotibial band region. Leg raising was tolerates approximately 30 without an increase of pain with dorsiflexion noted. DTRs were difficult to elicit. There was no sensory deficit or dermatomal distribution of the lower extremities noted. The patient had difficulty relaxing. Patient was with mild difficulty attending to stand on tiptoes and heels. EHL strength was slightly decreased. There was negative clonus negative Homans. Abdomen was nontender with no costovertebral tenderness noted.          Assessment & Plan:   Degenerative disc disease cervical spine  Status post surgical intervention of the cervical region  Cervical facet syndrome  Bilateral occipital neuralgia  Status post left shoulder surgery  Restless leg syndrome  Fibromyalgia  Chronic fatigue syndrome       PLAN   Continue present medications Opana ER, oxycodone, Parafon forte, and Neurontin. We will consider increasing Neurontin which is presently prescribed for restless leg syndrome. The patient admits to previously taking Lyrica and Cymbalta.   F/U PCP Clayton Anderson  for evaliation of  BP and general medical  condition  F/U surgical evaluation. May consider pending follow-up evaluations  F/U neurological evaluation. May consider pending follow-up evaluations  Ask the nurses and secretary's the date of your cervical MRI  May consider radiofrequency rhizolysis or intraspinal procedures pending response to present treatment and F/U evaluation   Patient to call Pain Management Center should patient have concerns prior to scheduled return appointment.

## 2015-10-24 NOTE — Progress Notes (Signed)
Safety precautions to be maintained throughout the outpatient stay will include: orient to surroundings, keep bed in low position, maintain call bell within reach at all times, provide assistance with transfer out of bed and ambulation.  

## 2015-10-24 NOTE — Patient Instructions (Addendum)
Continue present medications  F/U PCP Dr.Babaoff  for evaliation of  BP and general medical  condition  F/U surgical evaluation. May consider pending follow-up evaluations  F/U neurological evaluation. May consider pending follow-up evaluations  Ask the nurses and secretary's the date of your cervical MRI  May consider radiofrequency rhizolysis or intraspinal procedures pending response to present treatment and F/U evaluation   Patient to call Pain Management Center should patient have concerns prior to scheduled return appointment.

## 2015-11-01 ENCOUNTER — Other Ambulatory Visit: Payer: Self-pay | Admitting: Pain Medicine

## 2015-11-20 ENCOUNTER — Telehealth: Payer: Self-pay | Admitting: *Deleted

## 2015-11-20 ENCOUNTER — Ambulatory Visit
Admission: RE | Admit: 2015-11-20 | Discharge: 2015-11-20 | Disposition: A | Discharge status: Home / Self Care | Payer: Medicare Other | Source: Ambulatory Visit | Attending: Pain Medicine | Admitting: Pain Medicine

## 2015-11-20 DIAGNOSIS — R5382 Chronic fatigue, unspecified: Secondary | ICD-10-CM | POA: Diagnosis not present

## 2015-11-20 DIAGNOSIS — M5030 Other cervical disc degeneration, unspecified cervical region: Secondary | ICD-10-CM

## 2015-11-20 DIAGNOSIS — M5382 Other specified dorsopathies, cervical region: Secondary | ICD-10-CM | POA: Insufficient documentation

## 2015-11-20 DIAGNOSIS — M5481 Occipital neuralgia: Secondary | ICD-10-CM | POA: Insufficient documentation

## 2015-11-20 DIAGNOSIS — M47812 Spondylosis without myelopathy or radiculopathy, cervical region: Secondary | ICD-10-CM

## 2015-11-20 DIAGNOSIS — M50223 Other cervical disc displacement at C6-C7 level: Secondary | ICD-10-CM | POA: Diagnosis not present

## 2015-11-20 DIAGNOSIS — M797 Fibromyalgia: Secondary | ICD-10-CM | POA: Diagnosis not present

## 2015-11-20 DIAGNOSIS — G2581 Restless legs syndrome: Secondary | ICD-10-CM | POA: Diagnosis not present

## 2015-11-20 DIAGNOSIS — Z981 Arthrodesis status: Secondary | ICD-10-CM

## 2015-11-20 NOTE — Telephone Encounter (Signed)
Voicemail left that MRI results are available.

## 2015-11-21 ENCOUNTER — Ambulatory Visit: Payer: Medicare Other | Admitting: Pain Medicine

## 2015-11-21 ENCOUNTER — Encounter: Payer: Self-pay | Admitting: Pain Medicine

## 2015-11-21 ENCOUNTER — Telehealth: Payer: Self-pay

## 2015-11-21 VITALS — BP 138/77 | HR 76 | Temp 98.1°F | Resp 18 | Ht 69.0 in | Wt 200.0 lb

## 2015-11-21 DIAGNOSIS — M5030 Other cervical disc degeneration, unspecified cervical region: Secondary | ICD-10-CM

## 2015-11-21 DIAGNOSIS — M5481 Occipital neuralgia: Secondary | ICD-10-CM

## 2015-11-21 DIAGNOSIS — M47812 Spondylosis without myelopathy or radiculopathy, cervical region: Secondary | ICD-10-CM

## 2015-11-21 DIAGNOSIS — Z9889 Other specified postprocedural states: Secondary | ICD-10-CM

## 2015-11-21 DIAGNOSIS — M50223 Other cervical disc displacement at C6-C7 level: Secondary | ICD-10-CM | POA: Diagnosis not present

## 2015-11-21 DIAGNOSIS — Z981 Arthrodesis status: Secondary | ICD-10-CM

## 2015-11-21 NOTE — Patient Instructions (Addendum)
PLAN    Continue present medications  Cervical facet, medial branch nerve blocks, to be performed at time of return appointment  F/U PCP Dr.Babaoff  for evaliation of  BP and general medical condition.  Please discuss increasing Neurontin (gabapentin ) with primary care physician Dr. Larwance Sachs . Recommend taking Neurontin 300 mg once a day or twice a day if tolerated    F/U surgical evaluation. May consider pending follow-up evaluations. Patient is status post recent neurosurgical evaluation by neurosurgeon with his surgery of the cervical region performed by Dr. Trey Sailors  F/U neurological evaluation. May consider PNCV/EMG studies and other studies as well as evaluation of headaches pending follow-up evaluations  May consider radiofrequency rhizolysis or intraspinal procedures pending response to present treatment and F/U evaluation   Patient to call [facility] should patient have concerns prior to scheduled return appointment. Facet Blocks Patient Information  Description: The facets are joints in the spine between the vertebrae.  Like any joints in the body, facets can become irritated and painful.  Arthritis can also effect the facets.  By injecting steroids and local anesthetic in and around these joints, we can temporarily block the nerve supply to them.  Steroids act directly on irritated nerves and tissues to reduce selling and inflammation which often leads to decreased pain.  Facet blocks may be done anywhere along the spine from the neck to the low back depending upon the location of your pain.   After numbing the skin with local anesthetic (like Novocaine), a small needle is passed onto the facet joints under x-ray guidance.  You may experience a sensation of pressure while this is being done.  The entire block usually lasts about 15-25 minutes.   Conditions which may be treated by facet blocks:   Low back/buttock pain  Neck/shoulder pain  Certain types of  headaches  Preparation for the injection:  1. Do not eat any solid food or dairy products within 8 hours of your appointment. 2. You may drink clear liquid up to 3 hours before appointment.  Clear liquids include water, black coffee, juice or soda.  No milk or cream please. 3. You may take your regular medication, including pain medications, with a sip of water before your appointment.  Diabetics should hold regular insulin (if taken separately) and take 1/2 normal NPH dose the morning of the procedure.  Carry some sugar containing items with you to your appointment. 4. A driver must accompany you and be prepared to drive you home after your procedure. 5. Bring all your current medications with you. 6. An IV may be inserted and sedation may be given at the discretion of the physician. 7. A blood pressure cuff, EKG and other monitors will often be applied during the procedure.  Some patients may need to have extra oxygen administered for a short period. 8. You will be asked to provide medical information, including your allergies and medications, prior to the procedure.  We must know immediately if you are taking blood thinners (like Coumadin/Warfarin) or if you are allergic to IV iodine contrast (dye).  We must know if you could possible be pregnant.  Possible side-effects:   Bleeding from needle site  Infection (rare, may require surgery)  Nerve injury (rare)  Numbness & tingling (temporary)  Difficulty urinating (rare, temporary)  Spinal headache (a headache worse with upright posture)  Light-headedness (temporary)  Pain at injection site (serveral days)  Decreased blood pressure (rare, temporary)  Weakness in arm/leg (temporary)  Pressure sensation in back/neck (temporary)   Call if you experience:   Fever/chills associated with headache or increased back/neck pain  Headache worsened by an upright position  New onset, weakness or numbness of an extremity below the  injection site  Hives or difficulty breathing (go to the emergency room)  Inflammation or drainage at the injection site(s)  Severe back/neck pain greater than usual  New symptoms which are concerning to you  Please note:  Although the local anesthetic injected can often make your back or neck feel good for several hours after the injection, the pain will likely return. It takes 3-7 days for steroids to work.  You may not notice any pain relief for at least one week.  If effective, we will often do a series of 2-3 injections spaced 3-6 weeks apart to maximally decrease your pain.  After the initial series, you may be a candidate for a more permanent nerve block of the facets.  If you have any questions, please call #[phone] [facility] Clinic[facility] R[facility] AND COMPLICATIONS  What are the risk, side effects and possible complications? Generally speaking, most procedures are safe.  However, with any procedure there are risks, side effects, and the possibility of complications.  The risks and complications are dependent upon the sites that are lesioned, or the type of nerve block to be performed.  The closer the procedure is to the spine, the more serious the risks are.  Great care is taken when placing the radio frequency needles, block needles or lesioning probes, but sometimes complications can occur. 1. Infection: Any time there is an injection through the skin, there is a risk of infection.  This is why sterile conditions are used for these blocks.  There are four possible types of infection. 1. Localized skin infection. 2. Central Nervous System Infection-This can be in the form of Meningitis, which can be deadly. 3. Epidural Infections-This can be in the form of an epidural abscess, which can cause pressure inside of the spine, causing compression of the spinal cord with subsequent paralysis. This would require an emergency surgery to decompress, and there  are no guarantees that the patient would recover from the paralysis. 4. Discitis-This is an infection of the intervertebral discs.  It occurs in about 1% of discography procedures.  It is difficult to treat and it may lead to surgery.        2. Pain: the needles have to go through skin and soft tissues, will cause soreness.       3. Damage to internal structures:  The nerves to be lesioned may be near blood vessels or    other nerves which can be potentially damaged.       4. Bleeding: Bleeding is more common if the patient is taking blood thinners such as  aspirin, Coumadin, Ticiid, Plavix, etc., or if he/she have some genetic predisposition  such as hemophilia. Bleeding into the spinal canal can cause compression of the spinal  cord with subsequent paralysis.  This would require an emergency surgery to  decompress and there are no guarantees that the patient would recover from the  paralysis.       5. Pneumothorax:  Puncturing of a lung is a possibility, every time a needle is introduced in  the area of the chest or upper back.  Pneumothorax refers to free air around the  collapsed lung(s), inside of the thoracic cavity (chest cavity).  Another two possible  complications related  to a similar event would include: Hemothorax and Chylothorax.   These are variations of the Pneumothorax, where instead of air around the collapsed  lung(s), you may have blood or chyle, respectively.       6. Spinal headaches: They may occur with any procedures in the area of the spine.       7. Persistent CSF (Cerebro-Spinal Fluid) leakage: This is a rare problem, but may occur  with prolonged intrathecal or epidural catheters either due to the formation of a fistulous  track or a dural tear.       8. Nerve damage: By working so close to the spinal cord, there is always a possibility of  nerve damage, which could be as serious as a permanent spinal cord injury with  paralysis.       9. Death:  Although rare, severe deadly  allergic reactions known as "Anaphylactic  reaction" can occur to any of the medications used.      10. Worsening of the symptoms:  We can always make thing worse.  What are the chances of something like this happening? Chances of any of this occuring are extremely low.  By statistics, you have more of a chance of getting killed in a motor vehicle accident: while driving to the hospital than any of the above occurring .  Nevertheless, you should be aware that they are possibilities.  In general, it is similar to taking a shower.  Everybody knows that you can slip, hit your head and get killed.  Does that mean that you should not shower again?  Nevertheless always keep in mind that statistics do not mean anything if you happen to be on the wrong side of them.  Even if a procedure has a 1 (one) in a 1,000,000 (million) chance of going wrong, it you happen to be that one..Also, keep in mind that by statistics, you have more of a chance of having something go wrong when taking medications.  Who should not have this procedure? If you are on a blood thinning medication (e.g. Coumadin, Plavix, see list of "Blood Thinners"), or if you have an active infection going on, you should not have the procedure.  If you are taking any blood thinners, please inform your physician.  How should I prepare for this procedure?  Do not eat or drink anything at least six hours prior to the procedure.  Bring a driver with you .  It cannot be a taxi.  Come accompanied by an adult that can drive you back, and that is strong enough to help you if your legs get weak or numb from the local anesthetic.  Take all of your medicines the morning of the procedure with just enough water to swallow them.  If you have diabetes, make sure that you are scheduled to have your procedure done first thing in the morning, whenever possible.  If you have diabetes, take only half of your insulin dose and notify our nurse that you have done so  as soon as you arrive at the clinic.  If you are diabetic, but only take blood sugar pills (oral hypoglycemic), then do not take them on the morning of your procedure.  You may take them after you have had the procedure.  Do not take aspirin or any aspirin-containing medications, at least eleven (11) days prior to the procedure.  They may prolong bleeding.  Wear loose fitting clothing that may be easy to take off and that you would  not mind if it got stained with Betadine or blood.  Do not wear any jewelry or perfume  Remove any nail coloring.  It will interfere with some of our monitoring equipment.  NOTE: Remember that this is not meant to be interpreted as a complete list of all possible complications.  Unforeseen problems may occur.  BLOOD THINNERS The following drugs contain aspirin or other products, which can cause increased bleeding during surgery and should not be taken for 2 weeks prior to and 1 week after surgery.  If you should need take something for relief of minor pain, you may take acetaminophen which is found in Tylenol,m Datril, Anacin-3 and Panadol. It is not blood thinner. The products listed below are.  Do not take any of the products listed below in addition to any listed on your instruction sheet.  A.P.C or A.P.C with Codeine Codeine Phosphate Capsules #3 Ibuprofen Ridaura  ABC compound Congesprin Imuran rimadil  Advil Cope Indocin Robaxisal  Alka-Seltzer Effervescent Pain Reliever and Antacid Coricidin or Coricidin-D  Indomethacin Rufen  Alka-Seltzer plus Cold Medicine Cosprin Ketoprofen S-A-C Tablets  Anacin Analgesic Tablets or Capsules Coumadin Korlgesic Salflex  Anacin Extra Strength Analgesic tablets or capsules CP-2 Tablets Lanoril Salicylate  Anaprox Cuprimine Capsules Levenox Salocol  Anexsia-D Dalteparin Magan Salsalate  Anodynos Darvon compound Magnesium Salicylate Sine-off  Ansaid Dasin Capsules Magsal Sodium Salicylate  Anturane Depen Capsules Marnal  Soma  APF Arthritis pain formula Dewitt's Pills Measurin Stanback  Argesic Dia-Gesic Meclofenamic Sulfinpyrazone  Arthritis Bayer Timed Release Aspirin Diclofenac Meclomen Sulindac  Arthritis pain formula Anacin Dicumarol Medipren Supac  Analgesic (Safety coated) Arthralgen Diffunasal Mefanamic Suprofen  Arthritis Strength Bufferin Dihydrocodeine Mepro Compound Suprol  Arthropan liquid Dopirydamole Methcarbomol with Aspirin Synalgos  ASA tablets/Enseals Disalcid Micrainin Tagament  Ascriptin Doan's Midol Talwin  Ascriptin A/D Dolene Mobidin Tanderil  Ascriptin Extra Strength Dolobid Moblgesic Ticlid  Ascriptin with Codeine Doloprin or Doloprin with Codeine Momentum Tolectin  Asperbuf Duoprin Mono-gesic Trendar  Aspergum Duradyne Motrin or Motrin IB Triminicin  Aspirin plain, buffered or enteric coated Durasal Myochrisine Trigesic  Aspirin Suppositories Easprin Nalfon Trillsate  Aspirin with Codeine Ecotrin Regular or Extra Strength Naprosyn Uracel  Atromid-S Efficin Naproxen Ursinus  Auranofin Capsules Elmiron Neocylate Vanquish  Axotal Emagrin Norgesic Verin  Azathioprine Empirin or Empirin with Codeine Normiflo Vitamin E  Azolid Emprazil Nuprin Voltaren  Bayer Aspirin plain, buffered or children's or timed BC Tablets or powders Encaprin Orgaran Warfarin Sodium  Buff-a-Comp Enoxaparin Orudis Zorpin  Buff-a-Comp with Codeine Equegesic Os-Cal-Gesic   Buffaprin Excedrin plain, buffered or Extra Strength Oxalid   Bufferin Arthritis Strength Feldene Oxphenbutazone   Bufferin plain or Extra Strength Feldene Capsules Oxycodone with Aspirin   Bufferin with Codeine Fenoprofen Fenoprofen Pabalate or Pabalate-SF   Buffets II Flogesic Panagesic   Buffinol plain or Extra Strength Florinal or Florinal with Codeine Panwarfarin   Buf-Tabs Flurbiprofen Penicillamine   Butalbital Compound Four-way cold tablets Penicillin   Butazolidin Fragmin Pepto-Bismol   Carbenicillin Geminisyn Percodan   Carna  Arthritis Reliever Geopen Persantine   Carprofen Gold's salt Persistin   Chloramphenicol Goody's Phenylbutazone   Chloromycetin Haltrain Piroxlcam   Clmetidine heparin Plaquenil   Cllnoril Hyco-pap Ponstel   Clofibrate Hydroxy chloroquine Propoxyphen         Before stopping any of these medications, be sure to consult the physician who ordered them.  Some, such as Coumadin (Warfarin) are ordered to prevent or treat serious conditions such as "deep thrombosis", "pumonary embolisms", and other heart problems.  The amount of time that you may need off of the medication may also vary with the medication and the reason for which you were taking it.  If you are taking any of these medications, please make sure you notify your pain physician before you undergo any procedures.         GENERAL RISKS AND COMPLICATIONS  What are the risk, side effects and possible complications? Generally speaking, most procedures are safe.  However, with any procedure there are risks, side effects, and the possibility of complications.  The risks and complications are dependent upon the sites that are lesioned, or the type of nerve block to be performed.  The closer the procedure is to the spine, the more serious the risks are.  Great care is taken when placing the radio frequency needles, block needles or lesioning probes, but sometimes complications can occur. 2. Infection: Any time there is an injection through the skin, there is a risk of infection.  This is why sterile conditions are used for these blocks.  There are four possible types of infection. 1. Localized skin infection. 2. Central Nervous System Infection-This can be in the form of Meningitis, which can be deadly. 3. Epidural Infections-This can be in the form of an epidural abscess, which can cause pressure inside of the spine, causing compression of the spinal cord with subsequent paralysis. This would require an emergency surgery to decompress, and  there are no guarantees that the patient would recover from the paralysis. 4. Discitis-This is an infection of the intervertebral discs.  It occurs in about 1% of discography procedures.  It is difficult to treat and it may lead to surgery.        2. Pain: the needles have to go through skin and soft tissues, will cause soreness.       3. Damage to internal structures:  The nerves to be lesioned may be near blood vessels or    other nerves which can be potentially damaged.       4. Bleeding: Bleeding is more common if the patient is taking blood thinners such as  aspirin, Coumadin, Ticiid, Plavix, etc., or if he/she have some genetic predisposition  such as hemophilia. Bleeding into the spinal canal can cause compression of the spinal  cord with subsequent paralysis.  This would require an emergency surgery to  decompress and there are no guarantees that the patient would recover from the  paralysis.       5. Pneumothorax:  Puncturing of a lung is a possibility, every time a needle is introduced in  the area of the chest or upper back.  Pneumothorax refers to free air around the  collapsed lung(s), inside of the thoracic cavity (chest cavity).  Another two possible  complications related to a similar event would include: Hemothorax and Chylothorax.   These are variations of the Pneumothorax, where instead of air around the collapsed  lung(s), you may have blood or chyle, respectively.       6. Spinal headaches: They may occur with any procedures in the area of the spine.       7. Persistent CSF (Cerebro-Spinal Fluid) leakage: This is a rare problem, but may occur  with prolonged intrathecal or epidural catheters either due to the formation of a fistulous  track or a dural tear.       8. Nerve damage: By working so close to the spinal cord, there is always a possibility of  nerve damage, which could  be as serious as a permanent spinal cord injury with  paralysis.       9. Death:  Although rare, severe  deadly allergic reactions known as "Anaphylactic  reaction" can occur to any of the medications used.      10. Worsening of the symptoms:  We can always make thing worse.  What are the chances of something like this happening? Chances of any of this occuring are extremely low.  By statistics, you have more of a chance of getting killed in a motor vehicle accident: while driving to the hospital than any of the above occurring .  Nevertheless, you should be aware that they are possibilities.  In general, it is similar to taking a shower.  Everybody knows that you can slip, hit your head and get killed.  Does that mean that you should not shower again?  Nevertheless always keep in mind that statistics do not mean anything if you happen to be on the wrong side of them.  Even if a procedure has a 1 (one) in a 1,000,000 (million) chance of going wrong, it you happen to be that one..Also, keep in mind that by statistics, you have more of a chance of having something go wrong when taking medications.  Who should not have this procedure? If you are on a blood thinning medication (e.g. Coumadin, Plavix, see list of "Blood Thinners"), or if you have an active infection going on, you should not have the procedure.  If you are taking any blood thinners, please inform your physician.  How should I prepare for this procedure?  Do not eat or drink anything at least six hours prior to the procedure.  Bring a driver with you .  It cannot be a taxi.  Come accompanied by an adult that can drive you back, and that is strong enough to help you if your legs get weak or numb from the local anesthetic.  Take all of your medicines the morning of the procedure with just enough water to swallow them.  If you have diabetes, make sure that you are scheduled to have your procedure done first thing in the morning, whenever possible.  If you have diabetes, take only half of your insulin dose and notify our nurse that you have  done so as soon as you arrive at the clinic.  If you are diabetic, but only take blood sugar pills (oral hypoglycemic), then do not take them on the morning of your procedure.  You may take them after you have had the procedure.  Do not take aspirin or any aspirin-containing medications, at least eleven (11) days prior to the procedure.  They may prolong bleeding.  Wear loose fitting clothing that may be easy to take off and that you would not mind if it got stained with Betadine or blood.  Do not wear any jewelry or perfume  Remove any nail coloring.  It will interfere with some of our monitoring equipment.  NOTE: Remember that this is not meant to be interpreted as a complete list of all possible complications.  Unforeseen problems may occur.  BLOOD THINNERS The following drugs contain aspirin or other products, which can cause increased bleeding during surgery and should not be taken for 2 weeks prior to and 1 week after surgery.  If you should need take something for relief of minor pain, you may take acetaminophen which is found in Tylenol,m Datril, Anacin-3 and Panadol. It is not blood thinner. The products listed  below are.  Do not take any of the products listed below in addition to any listed on your instruction sheet.  A.P.C or A.P.C with Codeine Codeine Phosphate Capsules #3 Ibuprofen Ridaura  ABC compound Congesprin Imuran rimadil  Advil Cope Indocin Robaxisal  Alka-Seltzer Effervescent Pain Reliever and Antacid Coricidin or Coricidin-D  Indomethacin Rufen  Alka-Seltzer plus Cold Medicine Cosprin Ketoprofen S-A-C Tablets  Anacin Analgesic Tablets or Capsules Coumadin Korlgesic Salflex  Anacin Extra Strength Analgesic tablets or capsules CP-2 Tablets Lanoril Salicylate  Anaprox Cuprimine Capsules Levenox Salocol  Anexsia-D Dalteparin Magan Salsalate  Anodynos Darvon compound Magnesium Salicylate Sine-off  Ansaid Dasin Capsules Magsal Sodium Salicylate  Anturane Depen Capsules  Marnal Soma  APF Arthritis pain formula Dewitt's Pills Measurin Stanback  Argesic Dia-Gesic Meclofenamic Sulfinpyrazone  Arthritis Bayer Timed Release Aspirin Diclofenac Meclomen Sulindac  Arthritis pain formula Anacin Dicumarol Medipren Supac  Analgesic (Safety coated) Arthralgen Diffunasal Mefanamic Suprofen  Arthritis Strength Bufferin Dihydrocodeine Mepro Compound Suprol  Arthropan liquid Dopirydamole Methcarbomol with Aspirin Synalgos  ASA tablets/Enseals Disalcid Micrainin Tagament  Ascriptin Doan's Midol Talwin  Ascriptin A/D Dolene Mobidin Tanderil  Ascriptin Extra Strength Dolobid Moblgesic Ticlid  Ascriptin with Codeine Doloprin or Doloprin with Codeine Momentum Tolectin  Asperbuf Duoprin Mono-gesic Trendar  Aspergum Duradyne Motrin or Motrin IB Triminicin  Aspirin plain, buffered or enteric coated Durasal Myochrisine Trigesic  Aspirin Suppositories Easprin Nalfon Trillsate  Aspirin with Codeine Ecotrin Regular or Extra Strength Naprosyn Uracel  Atromid-S Efficin Naproxen Ursinus  Auranofin Capsules Elmiron Neocylate Vanquish  Axotal Emagrin Norgesic Verin  Azathioprine Empirin or Empirin with Codeine Normiflo Vitamin E  Azolid Emprazil Nuprin Voltaren  Bayer Aspirin plain, buffered or children's or timed BC Tablets or powders Encaprin Orgaran Warfarin Sodium  Buff-a-Comp Enoxaparin Orudis Zorpin  Buff-a-Comp with Codeine Equegesic Os-Cal-Gesic   Buffaprin Excedrin plain, buffered or Extra Strength Oxalid   Bufferin Arthritis Strength Feldene Oxphenbutazone   Bufferin plain or Extra Strength Feldene Capsules Oxycodone with Aspirin   Bufferin with Codeine Fenoprofen Fenoprofen Pabalate or Pabalate-SF   Buffets II Flogesic Panagesic   Buffinol plain or Extra Strength Florinal or Florinal with Codeine Panwarfarin   Buf-Tabs Flurbiprofen Penicillamine   Butalbital Compound Four-way cold tablets Penicillin   Butazolidin Fragmin Pepto-Bismol   Carbenicillin Geminisyn Percodan    Carna Arthritis Reliever Geopen Persantine   Carprofen Gold's salt Persistin   Chloramphenicol Goody's Phenylbutazone   Chloromycetin Haltrain Piroxlcam   Clmetidine heparin Plaquenil   Cllnoril Hyco-pap Ponstel   Clofibrate Hydroxy chloroquine Propoxyphen         Before stopping any of these medications, be sure to consult the physician who ordered them.  Some, such as Coumadin (Warfarin) are ordered to prevent or treat serious conditions such as "deep thrombosis", "pumonary embolisms", and other heart problems.  The amount of time that you may need off of the medication may also vary with the medication and the reason for which you were taking it.  If you are taking any of these medications, please make sure you notify your pain physician before you undergo any procedures.

## 2015-11-21 NOTE — Progress Notes (Signed)
Safety precautions to be maintained throughout the outpatient stay will include: orient to surroundings, keep bed in low position, maintain call bell within reach at all times, provide assistance with transfer out of bed and ambulation.  

## 2015-11-21 NOTE — Progress Notes (Signed)
Subjective:    Patient ID: Clayton Anderson, male    DOB: 04/05/1946, 70 y.o.   MRN: [id number]  HPI  The patient is a 70 year old gentleman with pain involving the neck radiating to the back of the head causing headaches. The patient is with pain in the mid and lower back region as well as into a lesser degree. Patient states that his pain interferes with activities of daily living as well as ability to obtain restful sleep. We discussed patient's condition including results of MRI of the cervical spine and will consider patient for treatment including interventional treatment at time return appointment. Pending response to treatment and May consider patient for modification of medications and other aspects of treatment regimen decrease severity of patient's symptoms. The patient was with understanding and agreement suggested treatment plan. The patient denies any recent trauma change in events of daily living the call significant change in symptomatology. We will proceed with cervical facet, medial branch nerve, blocks at time return appointment in attempt to decrease. Symptoms, minimize progression of symptoms, and avoid the need for more involved treatment. The patient agreed to suggested treatment plan     Review of Systems     Objective:   Physical Exam  There was tends to palpation of paraspinal must reason cervical region cervical facet region with tenderness over the splenius capitis and occipitalis musculature regions a moderate degree there was moderate to moderately severe tenderness to palpation of the cervical facet cervical paraspinal musculature region with palpation of the thoracic facet thoracic paraspinal must reason reproducing moderate discomfort as well there was tenderness over the acromioclavicular and glenohumeral joint regions of moderate degree with unremarkable Spurling's maneuver. There was questionably decreased grip strength and Tinel and Phalen's maneuver were without  increased pain of significant degree. Palpation over the lumbar paraspinal must reason lumbar facet region was attends to palpation of moderate degree with lateral bending rotation extension and palpation of the lumbar facets reproducing moderate discomfort. DTRs were difficult to this patient had difficulty relaxing. There was tenderness of the PSIS and PII S region. Palpation the greater trochanteric region iliotibial band region was attends to palpation of moderate degree with no sensory deficit or dermatomal distribution detected. There was negative clonus negative Homans abdomen nontender with no costovertebral tenderness    Assessment & Plan:   Degenerative disc disease cervical spine Previous anterior cervical fusion from C3 through C6. Sufficient patency of the central canal and neural foramina.  C2-3: Right-sided facet arthropathy which could be a cause of pain. Mild foraminal encroachment on the right because of osteophytes.  C6-7: Previous posterior fusion. 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 This effaces the ventral subarachnoid space and indents the ventral aspect of the cord. No abnormal cord edema however. No foraminal extension. Status post surgical intervention of the cervical region  Cervical facet syndrome  Bilateral occipital neuralgia  Status post left shoulder surgery  Restless leg syndrome  Fibromyalgia   Chronic fatigue syndrome      PLAN    Continue present medications  Cervical facet, medial branch nerve blocks, to be performed at time of return appointment  F/U PCP Dr.Babaoff  for evaliation of  BP and general medical condition.  Please discuss increasing Neurontin (gabapentin ) with primary care physician Dr. Larwance SachsBabaoff . Recommend taking Neurontin 300 mg once a day or twice a day if tolerated    F/U surgical evaluation. May consider pending follow-up evaluations. Patient is status  post recent  neurosurgical evaluation by neurosurgeon with his surgery of the cervical region performed by Dr. Trey Sailors  F/U neurological evaluation. May consider PNCV/EMG studies and other studies as well as evaluation of headaches pending follow-up evaluations  May consider radiofrequency rhizolysis or intraspinal procedures pending response to present treatment and F/U evaluation   [facility] to call [facility] should patient have concerns prior to scheduled return appointment.

## 2015-11-21 NOTE — Telephone Encounter (Signed)
Please call pt back at the end of the day.. Pt wants to know about his MRI results

## 2015-12-02 ENCOUNTER — Ambulatory Visit: Payer: Medicare Other | Attending: Pain Medicine | Admitting: Pain Medicine

## 2015-12-02 ENCOUNTER — Encounter: Payer: Self-pay | Admitting: Pain Medicine

## 2015-12-02 VITALS — BP 158/81 | HR 66 | Temp 97.8°F | Resp 14 | Ht 69.0 in | Wt 200.0 lb

## 2015-12-02 DIAGNOSIS — M2578 Osteophyte, vertebrae: Secondary | ICD-10-CM | POA: Diagnosis not present

## 2015-12-02 DIAGNOSIS — M1288 Other specific arthropathies, not elsewhere classified, other specified site: Secondary | ICD-10-CM | POA: Insufficient documentation

## 2015-12-02 DIAGNOSIS — Z9889 Other specified postprocedural states: Secondary | ICD-10-CM

## 2015-12-02 DIAGNOSIS — M47812 Spondylosis without myelopathy or radiculopathy, cervical region: Secondary | ICD-10-CM

## 2015-12-02 DIAGNOSIS — Z981 Arthrodesis status: Secondary | ICD-10-CM | POA: Diagnosis not present

## 2015-12-02 DIAGNOSIS — M5030 Other cervical disc degeneration, unspecified cervical region: Secondary | ICD-10-CM | POA: Insufficient documentation

## 2015-12-02 DIAGNOSIS — M5481 Occipital neuralgia: Secondary | ICD-10-CM

## 2015-12-02 DIAGNOSIS — M50223 Other cervical disc displacement at C6-C7 level: Secondary | ICD-10-CM | POA: Diagnosis not present

## 2015-12-02 DIAGNOSIS — M542 Cervicalgia: Secondary | ICD-10-CM | POA: Diagnosis present

## 2015-12-02 MED ORDER — SODIUM CHLORIDE 0.9% FLUSH: 20.0000 mL | Freq: Once | INTRAVENOUS | Status: DC

## 2015-12-02 MED ORDER — FENTANYL CITRATE (PF) 100 MCG/2ML IJ SOLN: 100.0000 ug | Freq: Once | INTRAMUSCULAR | Status: DC

## 2015-12-02 MED ORDER — FENTANYL CITRATE (PF) 100 MCG/2ML IJ SOLN
INTRAMUSCULAR | Status: AC
  Administered 2015-12-02: 100 ug
  Filled 2015-12-02: qty 2

## 2015-12-02 MED ORDER — ORPHENADRINE CITRATE 30 MG/ML IJ SOLN
INTRAMUSCULAR | Status: AC
  Administered 2015-12-02: 14:00:00
  Filled 2015-12-02: qty 2

## 2015-12-02 MED ORDER — ORPHENADRINE CITRATE 30 MG/ML IJ SOLN: 60.0000 mg | Freq: Once | INTRAMUSCULAR | Status: DC

## 2015-12-02 MED ORDER — BUPIVACAINE HCL (PF) 0.25 % IJ SOLN
INTRAMUSCULAR | Status: AC
  Administered 2015-12-02: 14:00:00
  Filled 2015-12-02: qty 30

## 2015-12-02 MED ORDER — LIDOCAINE HCL (PF) 1 % IJ SOLN: 10.0000 mL | Freq: Once | INTRAMUSCULAR | Status: DC

## 2015-12-02 MED ORDER — MIDAZOLAM HCL 5 MG/5ML IJ SOLN
INTRAMUSCULAR | Status: AC
  Administered 2015-12-02: 5 mg via INTRAVENOUS
  Filled 2015-12-02: qty 5

## 2015-12-02 MED ORDER — MIDAZOLAM HCL 5 MG/5ML IJ SOLN: 5.0000 mg | Freq: Once | INTRAMUSCULAR | Status: DC

## 2015-12-02 MED ORDER — TRIAMCINOLONE ACETONIDE 40 MG/ML IJ SUSP: 40.0000 mg | Freq: Once | INTRAMUSCULAR | Status: DC

## 2015-12-02 MED ORDER — CEFAZOLIN SODIUM 1-5 GM-% IV SOLN: 1.0000 g | Freq: Once | INTRAVENOUS | Status: DC

## 2015-12-02 MED ORDER — TRIAMCINOLONE ACETONIDE 40 MG/ML IJ SUSP
INTRAMUSCULAR | Status: AC
  Administered 2015-12-02: 14:00:00
  Filled 2015-12-02: qty 1

## 2015-12-02 MED ORDER — SODIUM CHLORIDE 0.9 % IJ SOLN
INTRAMUSCULAR | Status: AC
  Administered 2015-12-02: 14:00:00
  Filled 2015-12-02: qty 10

## 2015-12-02 MED ORDER — BUPIVACAINE HCL (PF) 0.25 % IJ SOLN: 30.0000 mL | Freq: Once | INTRAMUSCULAR | Status: DC

## 2015-12-02 MED ORDER — CEFUROXIME AXETIL 250 MG PO TABS: 250.0000 mg | ORAL_TABLET | Freq: Two times a day (BID) | ORAL | Status: DC

## 2015-12-02 MED ORDER — CEFAZOLIN SODIUM 1 G IJ SOLR
INTRAMUSCULAR | Status: AC
  Administered 2015-12-02: 1 g via INTRAVENOUS
  Filled 2015-12-02: qty 10

## 2015-12-02 MED ORDER — LACTATED RINGERS IV SOLN: 1000.0000 mL | INTRAVENOUS | Status: DC

## 2015-12-02 NOTE — Patient Instructions (Addendum)
PLAN    Continue present medications. Please obtain Ceftin antibiotic today and begin taking Ceftin antibiotic today as prescribed  F/U PCP Dr.Babaoff  for evaliation of  BP and general medical condition.  Please discuss increasing Neurontin (gabapentin ) with primary care physician Dr. Larwance SachsBabaoff . Recommend taking Neurontin 300 mg once a day or twice a day if tolerated    F/U surgical evaluation. May consider pending follow-up evaluations. Patient is status post recent neurosurgical evaluation by neurosurgeon with his surgery of the cervical region performed by Dr. Trey SailorsMark Roy  F/U neurological evaluation. May consider PNCV/EMG studies and other studies as well as evaluation of headaches pending follow-up evaluations  May consider radiofrequency rhizolysis or intraspinal procedures pending response to present treatment and F/U evaluation   [facility] to call [facility] should patient have concerns prior to scheduled return appointmentGENERAL RISKS AND COMPLICATIONS  What are the risk, side effects and possible complications? Generally speaking, most procedures are safe.  However, with any procedure there are risks, side effects, and the possibility of complications.  The risks and complications are dependent upon the sites that are lesioned, or the type of nerve block to be performed.  The closer the procedure is to the spine, the more serious the risks are.  Great care is taken when placing the radio frequency needles, block needles or lesioning probes, but sometimes complications can occur. 1. Infection: Any time there is an injection through the skin, there is a risk of infection.  This is why sterile conditions are used for these blocks.  There are four possible types of infection. 1. Localized skin infection. 2. Central Nervous System Infection-This can be in the form of Meningitis, which can be deadly. 3. Epidural Infections-This can be in the form of an epidural abscess, which can  cause pressure inside of the spine, causing compression of the spinal cord with subsequent paralysis. This would require an emergency surgery to decompress, and there are no guarantees that the patient would recover from the paralysis. 4. Discitis-This is an infection of the intervertebral discs.  It occurs in about 1% of discography procedures.  It is difficult to treat and it may lead to surgery.        2. Pain: the needles have to go through skin and soft tissues, will cause soreness.       3. Damage to internal structures:  The nerves to be lesioned may be near blood vessels or    other nerves which can be potentially damaged.       4. Bleeding: Bleeding is more common if the patient is taking blood thinners such as  aspirin, Coumadin, Ticiid, Plavix, etc., or if he/she have some genetic predisposition  such as hemophilia. Bleeding into the spinal canal can cause compression of the spinal  cord with subsequent paralysis.  This would require an emergency surgery to  decompress and there are no guarantees that the patient would recover from the  paralysis.       5. Pneumothorax:  Puncturing of a lung is a possibility, every time a needle is introduced in  the area of the chest or upper back.  Pneumothorax refers to free air around the  collapsed lung(s), inside of the thoracic cavity (chest cavity).  Another two possible  complications related to a similar event would include: Hemothorax and Chylothorax.   These are variations of the Pneumothorax, where instead of air around the collapsed  lung(s), you may have blood or chyle, respectively.  6. Spinal headaches: They may occur with any procedures in the area of the spine.       7. Persistent CSF (Cerebro-Spinal Fluid) leakage: This is a rare problem, but may occur  with prolonged intrathecal or epidural catheters either due to the formation of a fistulous  track or a dural tear.       8. Nerve damage: By working so close to the spinal cord, there is  always a possibility of  nerve damage, which could be as serious as a permanent spinal cord injury with  paralysis.       9. Death:  Although rare, severe deadly allergic reactions known as "Anaphylactic  reaction" can occur to any of the medications used.      10. Worsening of the symptoms:  We can always make thing worse.  What are the chances of something like this happening? Chances of any of this occuring are extremely low.  By statistics, you have more of a chance of getting killed in a motor vehicle accident: while driving to the hospital than any of the above occurring .  Nevertheless, you should be aware that they are possibilities.  In general, it is similar to taking a shower.  Everybody knows that you can slip, hit your head and get killed.  Does that mean that you should not shower again?  Nevertheless always keep in mind that statistics do not mean anything if you happen to be on the wrong side of them.  Even if a procedure has a 1 (one) in a 1,000,000 (million) chance of going wrong, it you happen to be that one..Also, keep in mind that by statistics, you have more of a chance of having something go wrong when taking medications.  Who should not have this procedure? If you are on a blood thinning medication (e.g. Coumadin, Plavix, see list of "Blood Thinners"), or if you have an active infection going on, you should not have the procedure.  If you are taking any blood thinners, please inform your physician.  How should I prepare for this procedure?  Do not eat or drink anything at least six hours prior to the procedure.  Bring a driver with you .  It cannot be a taxi.  Come accompanied by an adult that can drive you back, and that is strong enough to help you if your legs get weak or numb from the local anesthetic.  Take all of your medicines the morning of the procedure with just enough water to swallow them.  If you have diabetes, make sure that you are scheduled to have your  procedure done first thing in the morning, whenever possible.  If you have diabetes, take only half of your insulin dose and notify our nurse that you have done so as soon as you arrive at the clinic.  If you are diabetic, but only take blood sugar pills (oral hypoglycemic), then do not take them on the morning of your procedure.  You may take them after you have had the procedure.  Do not take aspirin or any aspirin-containing medications, at least eleven (11) days prior to the procedure.  They may prolong bleeding.  Wear loose fitting clothing that may be easy to take off and that you would not mind if it got stained with Betadine or blood.  Do not wear any jewelry or perfume  Remove any nail coloring.  It will interfere with some of our monitoring equipment.  NOTE: Remember that this is  not meant to be interpreted as a complete list of all possible complications.  Unforeseen problems may occur.  BLOOD THINNERS The following drugs contain aspirin or other products, which can cause increased bleeding during surgery and should not be taken for 2 weeks prior to and 1 week after surgery.  If you should need take something for relief of minor pain, you may take acetaminophen which is found in Tylenol,m Datril, Anacin-3 and Panadol. It is not blood thinner. The products listed below are.  Do not take any of the products listed below in addition to any listed on your instruction sheet.  A.P.C or A.P.C with Codeine Codeine Phosphate Capsules #3 Ibuprofen Ridaura  ABC compound Congesprin Imuran rimadil  Advil Cope Indocin Robaxisal  Alka-Seltzer Effervescent Pain Reliever and Antacid Coricidin or Coricidin-D  Indomethacin Rufen  Alka-Seltzer plus Cold Medicine Cosprin Ketoprofen S-A-C Tablets  Anacin Analgesic Tablets or Capsules Coumadin Korlgesic Salflex  Anacin Extra Strength Analgesic tablets or capsules CP-2 Tablets Lanoril Salicylate  Anaprox Cuprimine Capsules Levenox Salocol  Anexsia-D  Dalteparin Magan Salsalate  Anodynos Darvon compound Magnesium Salicylate Sine-off  Ansaid Dasin Capsules Magsal Sodium Salicylate  Anturane Depen Capsules Marnal Soma  APF Arthritis pain formula Dewitt's Pills Measurin Stanback  Argesic Dia-Gesic Meclofenamic Sulfinpyrazone  Arthritis Bayer Timed Release Aspirin Diclofenac Meclomen Sulindac  Arthritis pain formula Anacin Dicumarol Medipren Supac  Analgesic (Safety coated) Arthralgen Diffunasal Mefanamic Suprofen  Arthritis Strength Bufferin Dihydrocodeine Mepro Compound Suprol  Arthropan liquid Dopirydamole Methcarbomol with Aspirin Synalgos  ASA tablets/Enseals Disalcid Micrainin Tagament  Ascriptin Doan's Midol Talwin  Ascriptin A/D Dolene Mobidin Tanderil  Ascriptin Extra Strength Dolobid Moblgesic Ticlid  Ascriptin with Codeine Doloprin or Doloprin with Codeine Momentum Tolectin  Asperbuf Duoprin Mono-gesic Trendar  Aspergum Duradyne Motrin or Motrin IB Triminicin  Aspirin plain, buffered or enteric coated Durasal Myochrisine Trigesic  Aspirin Suppositories Easprin Nalfon Trillsate  Aspirin with Codeine Ecotrin Regular or Extra Strength Naprosyn Uracel  Atromid-S Efficin Naproxen Ursinus  Auranofin Capsules Elmiron Neocylate Vanquish  Axotal Emagrin Norgesic Verin  Azathioprine Empirin or Empirin with Codeine Normiflo Vitamin E  Azolid Emprazil Nuprin Voltaren  Bayer Aspirin plain, buffered or children's or timed BC Tablets or powders Encaprin Orgaran Warfarin Sodium  Buff-a-Comp Enoxaparin Orudis Zorpin  Buff-a-Comp with Codeine Equegesic Os-Cal-Gesic   Buffaprin Excedrin plain, buffered or Extra Strength Oxalid   Bufferin Arthritis Strength Feldene Oxphenbutazone   Bufferin plain or Extra Strength Feldene Capsules Oxycodone with Aspirin   Bufferin with Codeine Fenoprofen Fenoprofen Pabalate or Pabalate-SF   Buffets II Flogesic Panagesic   Buffinol plain or Extra Strength Florinal or Florinal with Codeine Panwarfarin    Buf-Tabs Flurbiprofen Penicillamine   Butalbital Compound Four-way cold tablets Penicillin   Butazolidin Fragmin Pepto-Bismol   Carbenicillin Geminisyn Percodan   Carna Arthritis Reliever Geopen Persantine   Carprofen Gold's salt Persistin   Chloramphenicol Goody's Phenylbutazone   Chloromycetin Haltrain Piroxlcam   Clmetidine heparin Plaquenil   Cllnoril Hyco-pap Ponstel   Clofibrate Hydroxy chloroquine Propoxyphen         Before stopping any of these medications, be sure to consult the physician who ordered them.  Some, such as Coumadin (Warfarin) are ordered to prevent or treat serious conditions such as "deep thrombosis", "pumonary embolisms", and other heart problems.  The amount of time that you may need off of the medication may also vary with the medication and the reason for which you were taking it.  If you are taking any of these medications, please make sure  you notify your pain physician before you undergo any procedures.         GENERAL RISKS AND COMPLICATIONS  What are the risk, side effects and possible complications? Generally speaking, most procedures are safe.  However, with any procedure there are risks, side effects, and the possibility of complications.  The risks and complications are dependent upon the sites that are lesioned, or the type of nerve block to be performed.  The closer the procedure is to the spine, the more serious the risks are.  Great care is taken when placing the radio frequency needles, block needles or lesioning probes, but sometimes complications can occur. 2. Infection: Any time there is an injection through the skin, there is a risk of infection.  This is why sterile conditions are used for these blocks.  There are four possible types of infection. 1. Localized skin infection. 2. Central Nervous System Infection-This can be in the form of Meningitis, which can be deadly. 3. Epidural Infections-This can be in the form of an epidural  abscess, which can cause pressure inside of the spine, causing compression of the spinal cord with subsequent paralysis. This would require an emergency surgery to decompress, and there are no guarantees that the patient would recover from the paralysis. 4. Discitis-This is an infection of the intervertebral discs.  It occurs in about 1% of discography procedures.  It is difficult to treat and it may lead to surgery.        2. Pain: the needles have to go through skin and soft tissues, will cause soreness.       3. Damage to internal structures:  The nerves to be lesioned may be near blood vessels or    other nerves which can be potentially damaged.       4. Bleeding: Bleeding is more common if the patient is taking blood thinners such as  aspirin, Coumadin, Ticiid, Plavix, etc., or if he/she have some genetic predisposition  such as hemophilia. Bleeding into the spinal canal can cause compression of the spinal  cord with subsequent paralysis.  This would require an emergency surgery to  decompress and there are no guarantees that the patient would recover from the  paralysis.       5. Pneumothorax:  Puncturing of a lung is a possibility, every time a needle is introduced in  the area of the chest or upper back.  Pneumothorax refers to free air around the  collapsed lung(s), inside of the thoracic cavity (chest cavity).  Another two possible  complications related to a similar event would include: Hemothorax and Chylothorax.   These are variations of the Pneumothorax, where instead of air around the collapsed  lung(s), you may have blood or chyle, respectively.       6. Spinal headaches: They may occur with any procedures in the area of the spine.       7. Persistent CSF (Cerebro-Spinal Fluid) leakage: This is a rare problem, but may occur  with prolonged intrathecal or epidural catheters either due to the formation of a fistulous  track or a dural tear.       8. Nerve damage: By working so close to the  spinal cord, there is always a possibility of  nerve damage, which could be as serious as a permanent spinal cord injury with  paralysis.       9. Death:  Although rare, severe deadly allergic reactions known as "Anaphylactic  reaction" can occur to any of the  medications used.      10. Worsening of the symptoms:  We can always make thing worse.  What are the chances of something like this happening? Chances of any of this occuring are extremely low.  By statistics, you have more of a chance of getting killed in a motor vehicle accident: while driving to the hospital than any of the above occurring .  Nevertheless, you should be aware that they are possibilities.  In general, it is similar to taking a shower.  Everybody knows that you can slip, hit your head and get killed.  Does that mean that you should not shower again?  Nevertheless always keep in mind that statistics do not mean anything if you happen to be on the wrong side of them.  Even if a procedure has a 1 (one) in a 1,000,000 (million) chance of going wrong, it you happen to be that one..Also, keep in mind that by statistics, you have more of a chance of having something go wrong when taking medications.  Who should not have this procedure? If you are on a blood thinning medication (e.g. Coumadin, Plavix, see list of "Blood Thinners"), or if you have an active infection going on, you should not have the procedure.  If you are taking any blood thinners, please inform your physician.  How should I prepare for this procedure?  Do not eat or drink anything at least six hours prior to the procedure.  Bring a driver with you .  It cannot be a taxi.  Come accompanied by an adult that can drive you back, and that is strong enough to help you if your legs get weak or numb from the local anesthetic.  Take all of your medicines the morning of the procedure with just enough water to swallow them.  If you have diabetes, make sure that you are  scheduled to have your procedure done first thing in the morning, whenever possible.  If you have diabetes, take only half of your insulin dose and notify our nurse that you have done so as soon as you arrive at the clinic.  If you are diabetic, but only take blood sugar pills (oral hypoglycemic), then do not take them on the morning of your procedure.  You may take them after you have had the procedure.  Do not take aspirin or any aspirin-containing medications, at least eleven (11) days prior to the procedure.  They may prolong bleeding.  Wear loose fitting clothing that may be easy to take off and that you would not mind if it got stained with Betadine or blood.  Do not wear any jewelry or perfume  Remove any nail coloring.  It will interfere with some of our monitoring equipment.  NOTE: Remember that this is not meant to be interpreted as a complete list of all possible complications.  Unforeseen problems may occur.  BLOOD THINNERS The following drugs contain aspirin or other products, which can cause increased bleeding during surgery and should not be taken for 2 weeks prior to and 1 week after surgery.  If you should need take something for relief of minor pain, you may take acetaminophen which is found in Tylenol,m Datril, Anacin-3 and Panadol. It is not blood thinner. The products listed below are.  Do not take any of the products listed below in addition to any listed on your instruction sheet.  A.P.C or A.P.C with Codeine Codeine Phosphate Capsules #3 Ibuprofen Ridaura  ABC compound Congesprin Imuran rimadil  Advil Cope Indocin Robaxisal  Alka-Seltzer Effervescent Pain Reliever and Antacid Coricidin or Coricidin-D  Indomethacin Rufen  Alka-Seltzer plus Cold Medicine Cosprin Ketoprofen S-A-C Tablets  Anacin Analgesic Tablets or Capsules Coumadin Korlgesic Salflex  Anacin Extra Strength Analgesic tablets or capsules CP-2 Tablets Lanoril Salicylate  Anaprox Cuprimine Capsules  Levenox Salocol  Anexsia-D Dalteparin Magan Salsalate  Anodynos Darvon compound Magnesium Salicylate Sine-off  Ansaid Dasin Capsules Magsal Sodium Salicylate  Anturane Depen Capsules Marnal Soma  APF Arthritis pain formula Dewitt's Pills Measurin Stanback  Argesic Dia-Gesic Meclofenamic Sulfinpyrazone  Arthritis Bayer Timed Release Aspirin Diclofenac Meclomen Sulindac  Arthritis pain formula Anacin Dicumarol Medipren Supac  Analgesic (Safety coated) Arthralgen Diffunasal Mefanamic Suprofen  Arthritis Strength Bufferin Dihydrocodeine Mepro Compound Suprol  Arthropan liquid Dopirydamole Methcarbomol with Aspirin Synalgos  ASA tablets/Enseals Disalcid Micrainin Tagament  Ascriptin Doan's Midol Talwin  Ascriptin A/D Dolene Mobidin Tanderil  Ascriptin Extra Strength Dolobid Moblgesic Ticlid  Ascriptin with Codeine Doloprin or Doloprin with Codeine Momentum Tolectin  Asperbuf Duoprin Mono-gesic Trendar  Aspergum Duradyne Motrin or Motrin IB Triminicin  Aspirin plain, buffered or enteric coated Durasal Myochrisine Trigesic  Aspirin Suppositories Easprin Nalfon Trillsate  Aspirin with Codeine Ecotrin Regular or Extra Strength Naprosyn Uracel  Atromid-S Efficin Naproxen Ursinus  Auranofin Capsules Elmiron Neocylate Vanquish  Axotal Emagrin Norgesic Verin  Azathioprine Empirin or Empirin with Codeine Normiflo Vitamin E  Azolid Emprazil Nuprin Voltaren  Bayer Aspirin plain, buffered or children's or timed BC Tablets or powders Encaprin Orgaran Warfarin Sodium  Buff-a-Comp Enoxaparin Orudis Zorpin  Buff-a-Comp with Codeine Equegesic Os-Cal-Gesic   Buffaprin Excedrin plain, buffered or Extra Strength Oxalid   Bufferin Arthritis Strength Feldene Oxphenbutazone   Bufferin plain or Extra Strength Feldene Capsules Oxycodone with Aspirin   Bufferin with Codeine Fenoprofen Fenoprofen Pabalate or Pabalate-SF   Buffets II Flogesic Panagesic   Buffinol plain or Extra Strength Florinal or Florinal with  Codeine Panwarfarin   Buf-Tabs Flurbiprofen Penicillamine   Butalbital Compound Four-way cold tablets Penicillin   Butazolidin Fragmin Pepto-Bismol   Carbenicillin Geminisyn Percodan   Carna Arthritis Reliever Geopen Persantine   Carprofen Gold's salt Persistin   Chloramphenicol Goody's Phenylbutazone   Chloromycetin Haltrain Piroxlcam   Clmetidine heparin Plaquenil   Cllnoril Hyco-pap Ponstel   Clofibrate Hydroxy chloroquine Propoxyphen         Before stopping any of these medications, be sure to consult the physician who ordered them.  Some, such as Coumadin (Warfarin) are ordered to prevent or treat serious conditions such as "deep thrombosis", "pumonary embolisms", and other heart problems.  The amount of time that you may need off of the medication may also vary with the medication and the reason for which you were taking it.  If you are taking any of these medications, please make sure you notify your pain physician before you undergo any procedures.

## 2015-12-02 NOTE — Progress Notes (Signed)
Patient here today for procedure d/t neck pain. Safety precautions to be maintained throughout the outpatient stay will include: orient to surroundings, keep bed in low position, maintain call bell within reach at all times, provide assistance with transfer out of bed and ambulation.

## 2015-12-02 NOTE — Progress Notes (Signed)
Subjective:    Patient ID: Clayton Anderson, male    DOB: 11/23/1945, 70 y.o.   MRN: [id number]  HPI  PROCEDURE PERFORMED: Cervical facet (medial branch block)   NOTE: The patient is a 70 y.o. male who [facility] for further evaluation and treatment of pain involving the cervical region and upper extremity region with prior studies revealing the patient to be with MRI evidence of degenerative disc disease cervical spine Previous anterior cervical fusion from C3 through C6. Sufficient patency of the central canal and neural foramina.  C2-3: Right-sided facet arthropathy which could be a cause of pain. Mild foraminal encroachment on the right because of osteophytes.  C6-7: Previous posterior fusion. 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 This effaces the ventral subarachnoid space and indents the ventral aspect of the cord. No abnormal cord edema however. No foraminal extension. Status post surgical intervention of the cervical region. 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 We will proceed with interventional treatment as discussed and explained to the patient who was understanding and willing to proceed with proposed treatment plan.   DESCRIPTION OF PROCEDURE: Cervical facet (medial branch block) with IV Versed, IV fentanyl conscious sedation, EKG, blood pressure, pulse, capnography, and pulse oximetry monitoring. The procedure was performed with the patient in the lateral decubitus position under fluoroscopic guidance with EKG, blood pressure, pulse, and pulse oximetry monitoring.   Right C3 cervical  facet (medial branch block). With the patient in the lateral decubitus position a 22 -gauge needle was inserted at the C3 vertebral body level under fluoroscopic guidance with needle placed in the center of the articulating pillar  of C C3. vertebral body.  Following documentation of needle tip in the center of the articulating pillar of C C3, needle placement was then accomplished at the C C4, C5, and C6 vertebral body levels. After needle placement was accomplished at each level of the cervical spine under fluoroscopic guidance needle placement was again verified with tip of needle documented to be in the center of the articulating pillars of C3, C4, C5, and C6.   Following negative aspiration for heme and CSF at each level, each level was injected with 1 mL of Preservative-Free normal saline with Kenalog. The patient tolerated the procedure well. Needle removed. A total of 40 mg of Kenalog was utilized for the procedure.   PLAN:  1. Medications: The patient will continue presently prescribed medications. 2. Will consider modification of treatment regimen pending response to treatment rendered on today's visit and follow-up evaluation. 3. The patient is [facility]  for evaluation of blood pressure and general medical condition following procedure performed on today's visit. 4. Surgical evaluation.Has been addressed       Neurological evaluation with PNCV EMG studies has been addressed  5. May consider radiofrequency procedures, implantation device, and other treatment pending response to treatment and follow-up evaluation. 6. The patient has been advised to adhere to proper body mechanic and avoid activities which appear to aggravate condition. 7. The patient has been advised to call the [facility] prior to scheduled return appointment should there be significant change in condition  or should patient have other concerns regarding condition prior to  scheduled return appointment.  The patient is understanding and in agreement with suggested treatment plan.   Review of Systems     Objective:   Physical Exam        Assessment & Plan:

## 2015-12-03 ENCOUNTER — Telehealth: Payer: Self-pay | Admitting: *Deleted

## 2015-12-03 NOTE — Telephone Encounter (Signed)
No problems post procedure. 

## 2015-12-24 ENCOUNTER — Ambulatory Visit: Payer: Medicare Other | Admitting: Pain Medicine

## 2015-12-31 ENCOUNTER — Ambulatory Visit: Payer: Medicare Other | Attending: Pain Medicine | Admitting: Pain Medicine

## 2015-12-31 ENCOUNTER — Encounter: Payer: Self-pay | Admitting: Pain Medicine

## 2015-12-31 VITALS — BP 121/72 | HR 105 | Temp 98.0°F | Resp 16 | Ht 69.0 in | Wt 198.0 lb

## 2015-12-31 DIAGNOSIS — M5481 Occipital neuralgia: Secondary | ICD-10-CM | POA: Diagnosis not present

## 2015-12-31 DIAGNOSIS — Z981 Arthrodesis status: Secondary | ICD-10-CM | POA: Diagnosis not present

## 2015-12-31 DIAGNOSIS — Z9889 Other specified postprocedural states: Secondary | ICD-10-CM | POA: Insufficient documentation

## 2015-12-31 DIAGNOSIS — M2578 Osteophyte, vertebrae: Secondary | ICD-10-CM | POA: Diagnosis not present

## 2015-12-31 DIAGNOSIS — M5030 Other cervical disc degeneration, unspecified cervical region: Secondary | ICD-10-CM | POA: Diagnosis not present

## 2015-12-31 DIAGNOSIS — M1288 Other specific arthropathies, not elsewhere classified, other specified site: Secondary | ICD-10-CM | POA: Insufficient documentation

## 2015-12-31 DIAGNOSIS — R5382 Chronic fatigue, unspecified: Secondary | ICD-10-CM | POA: Diagnosis not present

## 2015-12-31 DIAGNOSIS — G2581 Restless legs syndrome: Secondary | ICD-10-CM | POA: Diagnosis not present

## 2015-12-31 DIAGNOSIS — R51 Headache: Secondary | ICD-10-CM | POA: Insufficient documentation

## 2015-12-31 DIAGNOSIS — M542 Cervicalgia: Secondary | ICD-10-CM | POA: Diagnosis present

## 2015-12-31 DIAGNOSIS — M50223 Other cervical disc displacement at C6-C7 level: Secondary | ICD-10-CM | POA: Diagnosis not present

## 2015-12-31 DIAGNOSIS — M797 Fibromyalgia: Secondary | ICD-10-CM | POA: Insufficient documentation

## 2015-12-31 DIAGNOSIS — M47812 Spondylosis without myelopathy or radiculopathy, cervical region: Secondary | ICD-10-CM

## 2015-12-31 MED ORDER — OXYCODONE HCL 10 MG PO TABS: ORAL_TABLET | ORAL | Status: DC

## 2015-12-31 MED ORDER — OXYMORPHONE HCL ER 40 MG PO TB12: ORAL_TABLET | ORAL | Status: DC

## 2015-12-31 NOTE — Patient Instructions (Addendum)
PLAN    Continue present medications  Radiofrequency rhizolysis cervical facet, medial branch nerves to be performed at time return appointment  F/U PCP Dr.Babaoff  for evaliation of  BP and general medical condition.  Please discuss increasing Neurontin (gabapentin ) with primary care physician Dr. Larwance Sachs . Recommend taking Neurontin 300 mg once a day or twice a day if tolerated    F/U surgical evaluation. May consider pending follow-up evaluations. Patient is status post recent neurosurgical evaluation by neurosurgeon with his surgery of the cervical region performed by Dr. Trey Sailors  F/U neurological evaluation. May consider PNCV/EMG studies and other studies as well as evaluation of headaches pending follow-up evaluations  May consider radiofrequency rhizolysis or intraspinal procedures pending response to present treatment and F/U evaluation   Patient to call [facility] should patient have concerns prior to scheduled return appointment.GENERAL RISKS AND COMPLICATIONS  What are the risk, side effects and possible complications? Generally speaking, most procedures are safe.  However, with any procedure there are risks, side effects, and the possibility of complications.  The risks and complications are dependent upon the sites that are lesioned, or the type of nerve block to be performed.  The closer the procedure is to the spine, the more serious the risks are.  Great care is taken when placing the radio frequency needles, block needles or lesioning probes, but sometimes complications can occur. 1. Infection: Any time there is an injection through the skin, there is a risk of infection.  This is why sterile conditions are used for these blocks.  There are four possible types of infection. 1. Localized skin infection. 2. Central Nervous System Infection-This can be in the form of Meningitis, which can be deadly. 3. Epidural Infections-This can be in the form of an epidural  abscess, which can cause pressure inside of the spine, causing compression of the spinal cord with subsequent paralysis. This would require an emergency surgery to decompress, and there are no guarantees that the patient would recover from the paralysis. 4. Discitis-This is an infection of the intervertebral discs.  It occurs in about 1% of discography procedures.  It is difficult to treat and it may lead to surgery.        2. Pain: the needles have to go through skin and soft tissues, will cause soreness.       3. Damage to internal structures:  The nerves to be lesioned may be near blood vessels or    other nerves which can be potentially damaged.       4. Bleeding: Bleeding is more common if the patient is taking blood thinners such as  aspirin, Coumadin, Ticiid, Plavix, etc., or if he/she have some genetic predisposition  such as hemophilia. Bleeding into the spinal canal can cause compression of the spinal  cord with subsequent paralysis.  This would require an emergency surgery to  decompress and there are no guarantees that the patient would recover from the  paralysis.       5. Pneumothorax:  Puncturing of a lung is a possibility, every time a needle is introduced in  the area of the chest or upper back.  Pneumothorax refers to free air around the  collapsed lung(s), inside of the thoracic cavity (chest cavity).  Another two possible  complications related to a similar event would include: Hemothorax and Chylothorax.   These are variations of the Pneumothorax, where instead of air around the collapsed  lung(s), you may have blood or chyle,  respectively.       6. Spinal headaches: They may occur with any procedures in the area of the spine.       7. Persistent CSF (Cerebro-Spinal Fluid) leakage: This is a rare problem, but may occur  with prolonged intrathecal or epidural catheters either due to the formation of a fistulous  track or a dural tear.       8. Nerve damage: By working so close to the  spinal cord, there is always a possibility of  nerve damage, which could be as serious as a permanent spinal cord injury with  paralysis.       9. Death:  Although rare, severe deadly allergic reactions known as "Anaphylactic  reaction" can occur to any of the medications used.      10. Worsening of the symptoms:  We can always make thing worse.  What are the chances of something like this happening? Chances of any of this occuring are extremely low.  By statistics, you have more of a chance of getting killed in a motor vehicle accident: while driving to the hospital than any of the above occurring .  Nevertheless, you should be aware that they are possibilities.  In general, it is similar to taking a shower.  Everybody knows that you can slip, hit your head and get killed.  Does that mean that you should not shower again?  Nevertheless always keep in mind that statistics do not mean anything if you happen to be on the wrong side of them.  Even if a procedure has a 1 (one) in a 1,000,000 (million) chance of going wrong, it you happen to be that one..Also, keep in mind that by statistics, you have more of a chance of having something go wrong when taking medications.  Who should not have this procedure? If you are on a blood thinning medication (e.g. Coumadin, Plavix, see list of "Blood Thinners"), or if you have an active infection going on, you should not have the procedure.  If you are taking any blood thinners, please inform your physician.  How should I prepare for this procedure?  Do not eat or drink anything at least six hours prior to the procedure.  Bring a driver with you .  It cannot be a taxi.  Come accompanied by an adult that can drive you back, and that is strong enough to help you if your legs get weak or numb from the local anesthetic.  Take all of your medicines the morning of the procedure with just enough water to swallow them.  If you have diabetes, make sure that you are  scheduled to have your procedure done first thing in the morning, whenever possible.  If you have diabetes, take only half of your insulin dose and notify our nurse that you have done so as soon as you arrive at the clinic.  If you are diabetic, but only take blood sugar pills (oral hypoglycemic), then do not take them on the morning of your procedure.  You may take them after you have had the procedure.  Do not take aspirin or any aspirin-containing medications, at least eleven (11) days prior to the procedure.  They may prolong bleeding.  Wear loose fitting clothing that may be easy to take off and that you would not mind if it got stained with Betadine or blood.  Do not wear any jewelry or perfume  Remove any nail coloring.  It will interfere with some of our monitoring  equipment.  NOTE: Remember that this is not meant to be interpreted as a complete list of all possible complications.  Unforeseen problems may occur.  BLOOD THINNERS The following drugs contain aspirin or other products, which can cause increased bleeding during surgery and should not be taken for 2 weeks prior to and 1 week after surgery.  If you should need take something for relief of minor pain, you may take acetaminophen which is found in Tylenol,m Datril, Anacin-3 and Panadol. It is not blood thinner. The products listed below are.  Do not take any of the products listed below in addition to any listed on your instruction sheet.  A.P.C or A.P.C with Codeine Codeine Phosphate Capsules #3 Ibuprofen Ridaura  ABC compound Congesprin Imuran rimadil  Advil Cope Indocin Robaxisal  Alka-Seltzer Effervescent Pain Reliever and Antacid Coricidin or Coricidin-D  Indomethacin Rufen  Alka-Seltzer plus Cold Medicine Cosprin Ketoprofen S-A-C Tablets  Anacin Analgesic Tablets or Capsules Coumadin Korlgesic Salflex  Anacin Extra Strength Analgesic tablets or capsules CP-2 Tablets Lanoril Salicylate  Anaprox Cuprimine Capsules  Levenox Salocol  Anexsia-D Dalteparin Magan Salsalate  Anodynos Darvon compound Magnesium Salicylate Sine-off  Ansaid Dasin Capsules Magsal Sodium Salicylate  Anturane Depen Capsules Marnal Soma  APF Arthritis pain formula Dewitt's Pills Measurin Stanback  Argesic Dia-Gesic Meclofenamic Sulfinpyrazone  Arthritis Bayer Timed Release Aspirin Diclofenac Meclomen Sulindac  Arthritis pain formula Anacin Dicumarol Medipren Supac  Analgesic (Safety coated) Arthralgen Diffunasal Mefanamic Suprofen  Arthritis Strength Bufferin Dihydrocodeine Mepro Compound Suprol  Arthropan liquid Dopirydamole Methcarbomol with Aspirin Synalgos  ASA tablets/Enseals Disalcid Micrainin Tagament  Ascriptin Doan's Midol Talwin  Ascriptin A/D Dolene Mobidin Tanderil  Ascriptin Extra Strength Dolobid Moblgesic Ticlid  Ascriptin with Codeine Doloprin or Doloprin with Codeine Momentum Tolectin  Asperbuf Duoprin Mono-gesic Trendar  Aspergum Duradyne Motrin or Motrin IB Triminicin  Aspirin plain, buffered or enteric coated Durasal Myochrisine Trigesic  Aspirin Suppositories Easprin Nalfon Trillsate  Aspirin with Codeine Ecotrin Regular or Extra Strength Naprosyn Uracel  Atromid-S Efficin Naproxen Ursinus  Auranofin Capsules Elmiron Neocylate Vanquish  Axotal Emagrin Norgesic Verin  Azathioprine Empirin or Empirin with Codeine Normiflo Vitamin E  Azolid Emprazil Nuprin Voltaren  Bayer Aspirin plain, buffered or children's or timed BC Tablets or powders Encaprin Orgaran Warfarin Sodium  Buff-a-Comp Enoxaparin Orudis Zorpin  Buff-a-Comp with Codeine Equegesic Os-Cal-Gesic   Buffaprin Excedrin plain, buffered or Extra Strength Oxalid   Bufferin Arthritis Strength Feldene Oxphenbutazone   Bufferin plain or Extra Strength Feldene Capsules Oxycodone with Aspirin   Bufferin with Codeine Fenoprofen Fenoprofen Pabalate or Pabalate-SF   Buffets II Flogesic Panagesic   Buffinol plain or Extra Strength Florinal or Florinal with  Codeine Panwarfarin   Buf-Tabs Flurbiprofen Penicillamine   Butalbital Compound Four-way cold tablets Penicillin   Butazolidin Fragmin Pepto-Bismol   Carbenicillin Geminisyn Percodan   Carna Arthritis Reliever Geopen Persantine   Carprofen Gold's salt Persistin   Chloramphenicol Goody's Phenylbutazone   Chloromycetin Haltrain Piroxlcam   Clmetidine heparin Plaquenil   Cllnoril Hyco-pap Ponstel   Clofibrate Hydroxy chloroquine Propoxyphen         Before stopping any of these medications, be sure to consult the physician who ordered them.  Some, such as Coumadin (Warfarin) are ordered to prevent or treat serious conditions such as "deep thrombosis", "pumonary embolisms", and other heart problems.  The amount of time that you may need off of the medication may also vary with the medication and the reason for which you were taking it.  If you are taking  any of these medications, please make sure you notify your pain physician before you undergo any procedures.         Radiofrequency Lesioning Radiofrequency lesioning is a procedure that is performed to relieve pain. The procedure is often used for back, neck, or arm pain. Radiofrequency lesioning involves the use of a machine that creates radio waves to make heat. During the procedure, the heat is applied to the nerve that carries the pain signal. The heat damages the nerve and interferes with the pain signal. Pain relief usually lasts for 6 months to 1 year. LET [facility] PROVID[facility] KNOW ABOUT: 2. Any allergies you have. 3. All medicines you are taking, including vitamins, herbs, eye drops, creams, and over-the-counter medicines. 4. Previous problems you or members of your family have had with the use of anesthetics. 5. Any blood disorders you have. 6. Previous surgeries you have had. 7. Any medical conditions you have. 8. Whether you are pregnant or may be pregnant. RISKS AND COMPLICATIONS Generally, this is a safe procedure.  However, problems may occur, including:  Pain or soreness at the injection site.  Infection at the injection site.  Damage to nerves or blood vessels. BEFORE THE PROCEDURE  Ask your health care provider about:  Changing or stopping your regular medicines. This is especially important if you are taking diabetes medicines or blood thinners.  Taking medicines such as aspirin and ibuprofen. These medicines can thin your blood. Do not take these medicines before your procedure if your health care provider instructs you not to.  Follow instructions from your health care provider about eating or drinking restrictions.  Plan to have someone take you home after the procedure.  If you go home right after the procedure, plan to have someone with you for 24 hours. PROCEDURE  You will be given one or more of the following:  A medicine to help you relax (sedative).  A medicine to numb the area (local anesthetic).  You will be awake during the procedure. You will need to be able to talk with the health care provider during the procedure.  With the help of a type of X-ray (fluoroscopy), the health care provider will insert a radiofrequency needle into the area to be treated.  Next, a wire that carries the radio waves (electrode) will be put through the radiofrequency needle. An electrical pulse will be sent through the electrode to verify the correct nerve. You will feel a tingling sensation, and you may have muscle twitching.  Then, the tissue that is around the needle tip will be heated by an electric current that is passed using the radiofrequency machine. This will numb the nerves.  A bandage (dressing) will be put on the insertion area after the procedure is done. The procedure may vary among health care providers and hospitals. AFTER THE PROCEDURE 12. Your blood pressure, heart rate, breathing rate, and blood oxygen level will be monitored often until the medicines you were given have  worn off. 13. Return to your normal activities as directed by your health care provider.   This information is not intended to replace advice given to you by your health care provider. Make sure you discuss any questions you have with your health care provider.   Document Released: 03/25/2011 Document Revised: 04/17/2015 Document Reviewed: 09/03/2014 Elsevier Interactive Patient Education [employer]2016 [employer]

## 2015-12-31 NOTE — Progress Notes (Signed)
Safety precautions to be maintained throughout the outpatient stay will include: orient to surroundings, keep bed in low position, maintain call bell within reach at all times, provide assistance with transfer out of bed and ambulation.  

## 2015-12-31 NOTE — Progress Notes (Signed)
Subjective:    Patient ID: Clayton Anderson, male    DOB: Oct 26, 1945, 70 y.o.   MRN: [id number]  HPI  The patient is a 70 year old gentleman who returns to pain management for further evaluation and treatment of pain involving the neck associated with headaches as well as pain of the mid lower back and lower extremity region. The patient had some improvement of his pain following previous cervical facet medial branch nerve blocks. The patient is undergone prior radiofrequency rhizolysis cervical facet medial branch nerves approximately 1 year ago with excellent relief of pain and wished to proceed with cervical facet medial branch nerve radiofrequency rhizolysis to be performed at time return appointment. We will proceed with such treatment pending insurance approval. The patient continues medications consisting of Opana extended release and oxycodone acetaminophen. On today's visit we replaced the oxycodone acetaminophen and oxycodone without the acetaminophen. We will proceed with radial freaks rhizolysis cervical facet medial branch nerves at return appointment pending insurance approval. All agreed to suggested treatment plan. Patient denied any recent trauma change in events of daily living the call significant changes of the pathology. The patient's lower back and lower extremity pain also is present and to a lesser degree.  Review of Systems     Objective:   Physical Exam  There was tends to palpation of paraspinal misreading cervical region cervical facet region with well-healed surgical scar of the cervical region without increased warmth and erythema in the region scar. Palpation over the thoracic facet thoracic paraspinal must reason was attends to palpation with no crepitus of the thoracic region noted. Palpation of the splenius capitis and occipitalis musculature regions reproduce moderate discomfort as well. The patient appeared to be with slightly decreased grip strength without increased  pain with Tinel and Phalen's maneuver. Palpation over the lumbar paraspinal must reason lumbar facet region was attends to palpation of moderate degree with lateral bending rotation extension and palpation of the lumbar facets reproducing moderate discomfort. There was moderate tenderness of the PSIS and PII S region as well as the gluteal and piriformis muscles regions. Straight leg raise was tolerates approximately 30 without increased pain with dorsiflexion noted. There was negative clonus negative Homans. No sensory deficit or dermatomal distribution detected. Abdomen nontender with no costovertebral tenderness noted.      Assessment & Plan:      Degenerative disc disease cervical spine Previous anterior cervical fusion from C3 through C6. Sufficient patency of the central canal and neural foramina.  C2-3: Right-sided facet arthropathy which could be a cause of pain. Mild foraminal encroachment on the right because of osteophytes.  C6-7: Previous posterior fusion. 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 This effaces the ventral subarachnoid space and indents the ventral aspect of the cord. No abnormal cord edema however. No foraminal extension. Status post surgical intervention of the cervical region  Cervical facet syndrome  Bilateral occipital neuralgia  Status post left shoulder surgery  Restless leg syndrome  Fibromyalgia   Chronic fatigue syndrome      PLAN    Continue present medications  Radiofrequency rhizolysis cervical facet, medial branch nerves to be performed at time return appointment  F/U PCP Dr.Babaoff  for evaliation of  BP and general medical condition.  Please discuss increasing Neurontin (gabapentin ) with primary care physician Dr. Larwance Sachs . Recommend taking Neurontin 300 mg once a day or twice a day if tolerated    F/U surgical evaluation. May consider pending follow-up evaluations.  Patient is status  post recent neurosurgical evaluation by neurosurgeon with his surgery of the cervical region performed by Dr. Trey SailorsMark Roy  F/U neurological evaluation. May consider PNCV/EMG studies and other studies as well as evaluation of headaches pending follow-up evaluations  Radial freaks rhizolysis cervical facet medial branch nerves to be performed pending insurance approval  Patient to call [facility] should patient have concerns prior to scheduled return appointment.

## 2016-01-28 ENCOUNTER — Ambulatory Visit: Payer: Medicare Other | Attending: Pain Medicine | Admitting: Pain Medicine

## 2016-01-28 ENCOUNTER — Encounter: Payer: Self-pay | Admitting: Pain Medicine

## 2016-01-28 VITALS — BP 136/72 | HR 80 | Temp 97.6°F | Resp 16 | Ht 69.0 in | Wt 200.0 lb

## 2016-01-28 DIAGNOSIS — R5382 Chronic fatigue, unspecified: Secondary | ICD-10-CM | POA: Insufficient documentation

## 2016-01-28 DIAGNOSIS — M5030 Other cervical disc degeneration, unspecified cervical region: Secondary | ICD-10-CM | POA: Diagnosis not present

## 2016-01-28 DIAGNOSIS — M1288 Other specific arthropathies, not elsewhere classified, other specified site: Secondary | ICD-10-CM | POA: Insufficient documentation

## 2016-01-28 DIAGNOSIS — Z9889 Other specified postprocedural states: Secondary | ICD-10-CM | POA: Diagnosis not present

## 2016-01-28 DIAGNOSIS — M5481 Occipital neuralgia: Secondary | ICD-10-CM

## 2016-01-28 DIAGNOSIS — G2581 Restless legs syndrome: Secondary | ICD-10-CM | POA: Diagnosis not present

## 2016-01-28 DIAGNOSIS — M2578 Osteophyte, vertebrae: Secondary | ICD-10-CM | POA: Diagnosis not present

## 2016-01-28 DIAGNOSIS — Z981 Arthrodesis status: Secondary | ICD-10-CM | POA: Diagnosis not present

## 2016-01-28 DIAGNOSIS — M797 Fibromyalgia: Secondary | ICD-10-CM | POA: Insufficient documentation

## 2016-01-28 DIAGNOSIS — M47812 Spondylosis without myelopathy or radiculopathy, cervical region: Secondary | ICD-10-CM

## 2016-01-28 DIAGNOSIS — M50223 Other cervical disc displacement at C6-C7 level: Secondary | ICD-10-CM | POA: Diagnosis not present

## 2016-01-28 DIAGNOSIS — R51 Headache: Secondary | ICD-10-CM | POA: Diagnosis present

## 2016-01-28 DIAGNOSIS — M542 Cervicalgia: Secondary | ICD-10-CM | POA: Diagnosis present

## 2016-01-28 MED ORDER — OXYCODONE HCL 10 MG PO TABS: ORAL_TABLET | ORAL | Status: DC

## 2016-01-28 MED ORDER — OXYMORPHONE HCL ER 40 MG PO TB12
ORAL_TABLET | ORAL | Status: DC
Start: 2016-01-28 — End: 2016-02-25

## 2016-01-28 NOTE — Progress Notes (Signed)
Subjective:    Patient ID: Clayton Anderson, male    DOB: Apr 23, 1946, 70 y.o.   MRN: [id number]  HPI  The patient is a 70 year old gentleman who returns to pain management for further evaluation and treatment of pain involving the region of the neck with pain in the neck radiating to the back of the head precipitating headaches as well. The patient states the predominant pain involves the neck to some pain of the upper back region is well. The patient is undergone cervical facet, medial branch nerve blocks to significant improvement of his pain. We discussed patient's condition and will continue presently prescribed medications consisting of oxycodone and Opana ER. We will proceed with radiofrequency rhizolysis cervical facet, medial branch nerves at time return appointment and will consider patient for additional modifications of treatment pending response to treatment and follow-up evaluation. The patient denies any trauma change in events of daily living the call significant change in symptomatology. All agreed to suggested treatment plan  Review of Systems     Objective:   Physical Exam  Palpation over the splenius capitis and occipitalis regions reproduced pain of moderate degree with moderate tenderness of the cervical facet cervical paraspinal musculature region was moderate tends to palpation of the thoracic facet thoracic paraspinal musculature region with no crepitus of the thoracic region noted. Palpation of the acromioclavicular and glenohumeral joint regions reproduces minimal discomfort and patient was at unremarkable drop test. The patient was with unremarkable Spurling's maneuver as well. The patient appeared to be with slightly decreased grip strength with Tinel and Phalen's maneuver reproducing minimal discomfort. Palpation over the lumbar region lumbar facet region reproduced mild to moderate discomfort with lateral bending rotation extension and palpation over the lumbar facets  reproducing mild to moderate discomfort. There was mild to moderate tenderness of the PSIS and PII S region with mild tenderness of the greater trochanteric region iliotibial band region. Straight leg raising was tolerates approximately 20 without increased pain with dorsiflexion noted. DTRs were difficult to elicit patient had difficulty relaxing. There was negative clonus negative Homans no sensory deficit of dermatomal distribution of the lower extremity is noted. Abdomen nontender with no costovertebral tenderness noted.      Assessment & Plan:       Degenerative disc disease cervical spine Previous anterior cervical fusion from C3 through C6. Sufficient patency of the central canal and neural foramina.  C2-3: Right-sided facet arthropathy which could be a cause of pain. Mild foraminal encroachment on the right because of osteophytes.  C6-7: Previous posterior fusion. 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 This effaces the ventral subarachnoid space and indents the ventral aspect of the cord. No abnormal cord edema however. No foraminal extension. Status post surgical intervention of the cervical region  Cervical facet syndrome  Bilateral occipital neuralgia  Status post left shoulder surgery  Restless leg syndrome  Fibromyalgia   Chronic fatigue syndrome      PLAN    Continue present medications oxycodone and Opana ER  Radiofrequency cervical facet, medial branch nerves, to be performed at time of return appointment  F/U PCP Dr.Babaoff  for evaliation of  BP and general medical condition.   F/U surgical evaluation. May consider pending follow-up evaluations. Patient is status post recent neurosurgical evaluation by neurosurgeon . Prior surgery of the cervical region performed by Dr. Trey Sailors  F/U neurological evaluation. May consider PNCV/EMG studies and other studies as well as evaluation of headaches pending  follow-up  evaluations  May consider additional radiofrequency rhizolysis or intraspinal procedures pending response to present treatment and F/U evaluation   Patient to call Pain Management Center should patient have concerns prior to scheduled return appointment.

## 2016-01-28 NOTE — Progress Notes (Signed)
Safety precautions to be maintained throughout the outpatient stay will include: orient to surroundings, keep bed in low position, maintain call bell within reach at all times, provide assistance with transfer out of bed and ambulation.  

## 2016-01-28 NOTE — Patient Instructions (Addendum)
PLAN    Continue present medications oxycodone and Opana ER  Radiofrequency cervical facet, medial branch nerves, to be performed at time of return appointment  F/U PCP Dr.Babaoff  for evaliation of  BP and general medical condition.   F/U surgical evaluation. May consider pending follow-up evaluations. Patient is status post recent neurosurgical evaluation by neurosurgeon . Prior surgery of the cervical region performed by Dr. Trey Sailors  F/U neurological evaluation. May consider PNCV/EMG studies and other studies as well as evaluation of headaches pending follow-up evaluations  May consider radiofrequency rhizolysis or intraspinal procedures pending response to present treatment and F/U evaluation   Patient to call Pain Management Center should patient have concerns prior to scheduled return appointment.Radiofrequency Lesioning Radiofrequency lesioning is a procedure that is performed to relieve pain. The procedure is often used for back, neck, or arm pain. Radiofrequency lesioning involves the use of a machine that creates radio waves to make heat. During the procedure, the heat is applied to the nerve that carries the pain signal. The heat damages the nerve and interferes with the pain signal. Pain relief usually lasts for 6 months to 1 year. LET [facility] CARE PROVIDER KNOW ABOUT:  Any allergies you have.  All medicines you are taking, including vitamins, herbs, eye drops, creams, and over-the-counter medicines.  Previous problems you or members of your family have had with the use of anesthetics.  Any blood disorders you have.  Previous surgeries you have had.  Any medical conditions you have.  Whether you are pregnant or may be pregnant. RISKS AND COMPLICATIONS Generally, this is a safe procedure. However, problems may occur, including:  Pain or soreness at the injection site.  Infection at the injection site.  Damage to nerves or blood vessels. BEFORE THE  PROCEDURE  Ask your health care provider about:  Changing or stopping your regular medicines. This is especially important if you are taking diabetes medicines or blood thinners.  Taking medicines such as aspirin and ibuprofen. These medicines can thin your blood. Do not take these medicines before your procedure if your health care provider instructs you not to.  Follow instructions from your health care provider about eating or drinking restrictions.  Plan to have someone take you home after the procedure.  If you go home right after the procedure, plan to have someone with you for 24 hours. PROCEDURE  You will be given one or more of the following:  A medicine to help you relax (sedative).  A medicine to numb the area (local anesthetic).  You will be awake during the procedure. You will need to be able to talk with the health care provider during the procedure.  With the help of a type of X-ray (fluoroscopy), the health care provider will insert a radiofrequency needle into the area to be treated.  Next, a wire that carries the radio waves (electrode) will be put through the radiofrequency needle. An electrical pulse will be sent through the electrode to verify the correct nerve. You will feel a tingling sensation, and you may have muscle twitching.  Then, the tissue that is around the needle tip will be heated by an electric current that is passed using the radiofrequency machine. This will numb the nerves.  A bandage (dressing) will be put on the insertion area after the procedure is done. The procedure may vary among health care providers and hospitals. AFTER THE PROCEDURE  Your blood pressure, heart rate, breathing rate, and blood oxygen level will be  monitored often until the medicines you were given have worn off.  Return to your normal activities as directed by your health care provider.   This information is not intended to replace advice given to you by your health  care provider. Make sure you discuss any questions you have with your health care provider.   Document Released: 03/25/2011 Document Revised: 04/17/2015 Document Reviewed: 09/03/2014 Elsevier Interactive Patient Education 2016 [employer] GENERAL RISKS AND COMPLICATIONS  What are the risk, side effects and possible complications? Generally speaking, most procedures are safe.  However, with any procedure there are risks, side effects, and the possibility of complications.  The risks and complications are dependent upon the sites that are lesioned, or the type of nerve block to be performed.  The closer the procedure is to the spine, the more serious the risks are.  Great care is taken when placing the radio frequency needles, block needles or lesioning probes, but sometimes complications can occur.  Infection: Any time there is an injection through the skin, there is a risk of infection.  This is why sterile conditions are used for these blocks.  There are four possible types of infection.  Localized skin infection.  Central Nervous System Infection-This can be in the form of Meningitis, which can be deadly.  Epidural Infections-This can be in the form of an epidural abscess, which can cause pressure inside of the spine, causing compression of the spinal cord with subsequent paralysis. This would require an emergency surgery to decompress, and there are no guarantees that the patient would recover from the paralysis.  Discitis-This is an infection of the intervertebral discs.  It occurs in about 1% of discography procedures.  It is difficult to treat and it may lead to surgery.        2. Pain: the needles have to go through skin and soft tissues, will cause soreness.       3. Damage to internal structures:  The nerves to be lesioned may be near blood vessels or    other nerves which can be potentially damaged.       4. Bleeding: Bleeding is more common if the patient is taking blood thinners  such as  aspirin, Coumadin, Ticiid, Plavix, etc., or if he/she have some genetic predisposition  such as hemophilia. Bleeding into the spinal canal can cause compression of the spinal  cord with subsequent paralysis.  This would require an emergency surgery to  decompress and there are no guarantees that the patient would recover from the  paralysis.       5. Pneumothorax:  Puncturing of a lung is a possibility, every time a needle is introduced in  the area of the chest or upper back.  Pneumothorax refers to free air around the  collapsed lung(s), inside of the thoracic cavity (chest cavity).  Another two possible  complications related to a similar event would include: Hemothorax and Chylothorax.   These are variations of the Pneumothorax, where instead of air around the collapsed  lung(s), you may have blood or chyle, respectively.       6. Spinal headaches: They may occur with any procedures in the area of the spine.       7. Persistent CSF (Cerebro-Spinal Fluid) leakage: This is a rare problem, but may occur  with prolonged intrathecal or epidural catheters either due to the formation of a fistulous  track or a dural tear.       8. Nerve damage: By working  so close to the spinal cord, there is always a possibility of  nerve damage, which could be as serious as a permanent spinal cord injury with  paralysis.       9. Death:  Although rare, severe deadly allergic reactions known as "Anaphylactic  reaction" can occur to any of the medications used.      10. Worsening of the symptoms:  We can always make thing worse.  What are the chances of something like this happening? Chances of any of this occuring are extremely low.  By statistics, you have more of a chance of getting killed in a motor vehicle accident: while driving to the hospital than any of the above occurring .  Nevertheless, you should be aware that they are possibilities.  In general, it is similar to taking a shower.  Everybody knows that you  can slip, hit your head and get killed.  Does that mean that you should not shower again?  Nevertheless always keep in mind that statistics do not mean anything if you happen to be on the wrong side of them.  Even if a procedure has a 1 (one) in a 1,000,000 (million) chance of going wrong, it you happen to be that one..Also, keep in mind that by statistics, you have more of a chance of having something go wrong when taking medications.  Who should not have this procedure? If you are on a blood thinning medication (e.g. Coumadin, Plavix, see list of "Blood Thinners"), or if you have an active infection going on, you should not have the procedure.  If you are taking any blood thinners, please inform your physician.  How should I prepare for this procedure?  Do not eat or drink anything at least six hours prior to the procedure.  Bring a driver with you .  It cannot be a taxi.  Come accompanied by an adult that can drive you back, and that is strong enough to help you if your legs get weak or numb from the local anesthetic.  Take all of your medicines the morning of the procedure with just enough water to swallow them.  If you have diabetes, make sure that you are scheduled to have your procedure done first thing in the morning, whenever possible.  If you have diabetes, take only half of your insulin dose and notify our nurse that you have done so as soon as you arrive at the clinic.  If you are diabetic, but only take blood sugar pills (oral hypoglycemic), then do not take them on the morning of your procedure.  You may take them after you have had the procedure.  Do not take aspirin or any aspirin-containing medications, at least eleven (11) days prior to the procedure.  They may prolong bleeding.  Wear loose fitting clothing that may be easy to take off and that you would not mind if it got stained with Betadine or blood.  Do not wear any jewelry or perfume  Remove any nail coloring.  It  will interfere with some of our monitoring equipment.  NOTE: Remember that this is not meant to be interpreted as a complete list of all possible complications.  Unforeseen problems may occur.  BLOOD THINNERS The following drugs contain aspirin or other products, which can cause increased bleeding during surgery and should not be taken for 2 weeks prior to and 1 week after surgery.  If you should need take something for relief of minor pain, you may take acetaminophen  which is found in Tylenol,m Datril, Anacin-3 and Panadol. It is not blood thinner. The products listed below are.  Do not take any of the products listed below in addition to any listed on your instruction sheet.  A.P.C or A.P.C with Codeine Codeine Phosphate Capsules #3 Ibuprofen Ridaura  ABC compound Congesprin Imuran rimadil  Advil Cope Indocin Robaxisal  Alka-Seltzer Effervescent Pain Reliever and Antacid Coricidin or Coricidin-D  Indomethacin Rufen  Alka-Seltzer plus Cold Medicine Cosprin Ketoprofen S-A-C Tablets  Anacin Analgesic Tablets or Capsules Coumadin Korlgesic Salflex  Anacin Extra Strength Analgesic tablets or capsules CP-2 Tablets Lanoril Salicylate  Anaprox Cuprimine Capsules Levenox Salocol  Anexsia-D Dalteparin Magan Salsalate  Anodynos Darvon compound Magnesium Salicylate Sine-off  Ansaid Dasin Capsules Magsal Sodium Salicylate  Anturane Depen Capsules Marnal Soma  APF Arthritis pain formula Dewitt's Pills Measurin Stanback  Argesic Dia-Gesic Meclofenamic Sulfinpyrazone  Arthritis Bayer Timed Release Aspirin Diclofenac Meclomen Sulindac  Arthritis pain formula Anacin Dicumarol Medipren Supac  Analgesic (Safety coated) Arthralgen Diffunasal Mefanamic Suprofen  Arthritis Strength Bufferin Dihydrocodeine Mepro Compound Suprol  Arthropan liquid Dopirydamole Methcarbomol with Aspirin Synalgos  ASA tablets/Enseals Disalcid Micrainin Tagament  Ascriptin Doan's Midol Talwin  Ascriptin A/D Dolene Mobidin Tanderil   Ascriptin Extra Strength Dolobid Moblgesic Ticlid  Ascriptin with Codeine Doloprin or Doloprin with Codeine Momentum Tolectin  Asperbuf Duoprin Mono-gesic Trendar  Aspergum Duradyne Motrin or Motrin IB Triminicin  Aspirin plain, buffered or enteric coated Durasal Myochrisine Trigesic  Aspirin Suppositories Easprin Nalfon Trillsate  Aspirin with Codeine Ecotrin Regular or Extra Strength Naprosyn Uracel  Atromid-S Efficin Naproxen Ursinus  Auranofin Capsules Elmiron Neocylate Vanquish  Axotal Emagrin Norgesic Verin  Azathioprine Empirin or Empirin with Codeine Normiflo Vitamin E  Azolid Emprazil Nuprin Voltaren  Bayer Aspirin plain, buffered or children's or timed BC Tablets or powders Encaprin Orgaran Warfarin Sodium  Buff-a-Comp Enoxaparin Orudis Zorpin  Buff-a-Comp with Codeine Equegesic Os-Cal-Gesic   Buffaprin Excedrin plain, buffered or Extra Strength Oxalid   Bufferin Arthritis Strength Feldene Oxphenbutazone   Bufferin plain or Extra Strength Feldene Capsules Oxycodone with Aspirin   Bufferin with Codeine Fenoprofen Fenoprofen Pabalate or Pabalate-SF   Buffets II Flogesic Panagesic   Buffinol plain or Extra Strength Florinal or Florinal with Codeine Panwarfarin   Buf-Tabs Flurbiprofen Penicillamine   Butalbital Compound Four-way cold tablets Penicillin   Butazolidin Fragmin Pepto-Bismol   Carbenicillin Geminisyn Percodan   Carna Arthritis Reliever Geopen Persantine   Carprofen Gold's salt Persistin   Chloramphenicol Goody's Phenylbutazone   Chloromycetin Haltrain Piroxlcam   Clmetidine heparin Plaquenil   Cllnoril Hyco-pap Ponstel   Clofibrate Hydroxy chloroquine Propoxyphen         Before stopping any of these medications, be sure to consult the physician who ordered them.  Some, such as Coumadin (Warfarin) are ordered to prevent or treat serious conditions such as "deep thrombosis", "pumonary embolisms", and other heart problems.  The amount of time that you may need off  of the medication may also vary with the medication and the reason for which you were taking it.  If you are taking any of these medications, please make sure you notify your pain physician before you undergo any procedures.

## 2016-02-03 ENCOUNTER — Ambulatory Visit: Payer: Medicare Other | Attending: Pain Medicine | Admitting: Pain Medicine

## 2016-02-03 ENCOUNTER — Encounter: Payer: Self-pay | Admitting: Pain Medicine

## 2016-02-03 DIAGNOSIS — M50223 Other cervical disc displacement at C6-C7 level: Secondary | ICD-10-CM | POA: Diagnosis not present

## 2016-02-03 DIAGNOSIS — M1288 Other specific arthropathies, not elsewhere classified, other specified site: Secondary | ICD-10-CM | POA: Insufficient documentation

## 2016-02-03 DIAGNOSIS — M5030 Other cervical disc degeneration, unspecified cervical region: Secondary | ICD-10-CM

## 2016-02-03 DIAGNOSIS — M79602 Pain in left arm: Secondary | ICD-10-CM | POA: Diagnosis present

## 2016-02-03 DIAGNOSIS — M542 Cervicalgia: Secondary | ICD-10-CM | POA: Diagnosis present

## 2016-02-03 DIAGNOSIS — Z981 Arthrodesis status: Secondary | ICD-10-CM

## 2016-02-03 DIAGNOSIS — M47812 Spondylosis without myelopathy or radiculopathy, cervical region: Secondary | ICD-10-CM

## 2016-02-03 DIAGNOSIS — M2578 Osteophyte, vertebrae: Secondary | ICD-10-CM | POA: Insufficient documentation

## 2016-02-03 DIAGNOSIS — Z9889 Other specified postprocedural states: Secondary | ICD-10-CM | POA: Diagnosis not present

## 2016-02-03 DIAGNOSIS — M79601 Pain in right arm: Secondary | ICD-10-CM | POA: Diagnosis present

## 2016-02-03 DIAGNOSIS — M5481 Occipital neuralgia: Secondary | ICD-10-CM

## 2016-02-03 MED ORDER — SODIUM CHLORIDE 0.9% FLUSH
20.0000 mL | Freq: Once | INTRAVENOUS | Status: AC
  Administered 2016-02-03: 20 mL

## 2016-02-03 MED ORDER — MIDAZOLAM HCL 5 MG/5ML IJ SOLN
5.0000 mg | Freq: Once | INTRAMUSCULAR | Status: AC
  Administered 2016-02-03: 5 mg via INTRAVENOUS
  Filled 2016-02-03: qty 5

## 2016-02-03 MED ORDER — CEFAZOLIN IN D5W 1 GM/50ML IV SOLN
1.0000 g | Freq: Once | INTRAVENOUS | Status: AC
  Administered 2016-02-03: 1 g via INTRAVENOUS

## 2016-02-03 MED ORDER — CEFAZOLIN SODIUM 1 G IJ SOLR
INTRAMUSCULAR | Status: AC
  Administered 2016-02-03: 15:00:00
  Filled 2016-02-03: qty 10

## 2016-02-03 MED ORDER — LIDOCAINE HCL (PF) 1 % IJ SOLN
10.0000 mL | Freq: Once | INTRAMUSCULAR | Status: AC
  Administered 2016-02-03: 10 mL via SUBCUTANEOUS
  Filled 2016-02-03: qty 10

## 2016-02-03 MED ORDER — FENTANYL CITRATE (PF) 100 MCG/2ML IJ SOLN
100.0000 ug | Freq: Once | INTRAMUSCULAR | Status: AC
  Administered 2016-02-03: 100 ug via INTRAVENOUS
  Filled 2016-02-03: qty 2

## 2016-02-03 MED ORDER — BUPIVACAINE HCL (PF) 0.25 % IJ SOLN
30.0000 mL | Freq: Once | INTRAMUSCULAR | Status: AC
  Administered 2016-02-03: 30 mL

## 2016-02-03 MED ORDER — CEFUROXIME AXETIL 250 MG PO TABS: 250.0000 mg | ORAL_TABLET | Freq: Two times a day (BID) | ORAL | Status: DC

## 2016-02-03 MED ORDER — LACTATED RINGERS IV SOLN: 1000.0000 mL | INTRAVENOUS | Status: DC

## 2016-02-03 MED ORDER — ORPHENADRINE CITRATE 30 MG/ML IJ SOLN
60.0000 mg | Freq: Once | INTRAMUSCULAR | Status: AC
  Administered 2016-02-03: 60 mg via INTRAMUSCULAR

## 2016-02-03 NOTE — Progress Notes (Signed)
Subjective:    Patient ID: Clayton Anderson, Clayton Anderson    DOB: 1946-05-05, 70 y.o.   MRN: [id number]  HPI                      COOLIEF RADIOFREQUENCY RHIZOLYSIS CERVICAL FACETS                                          (MEDIAL BRANCH NERVES)    PROCEDURE PERFORMED: Radiofrequency Rhizolysis Cervical Facets (Medial Branch Nerves)  The procedure was performed with the Coolief technique. The Coolief technique was performed using the Synergy cooled radiofrequency probe CRK 17 75-2 the synergy cooled radiofrequency introducer, and the synergy cooled sterile tube kit  HISTORY:  The patient is a 70 year old gentleman who returns to the pain management center for further evaluation and treatment of severely disabling pain involving the cervical and upper extremity regions. 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 Sufficient patency of the central canal and neural foramina.  C2-3: Right-sided facet arthropathy which could be a cause of pain. Mild foraminal encroachment on the right because of osteophytes.  C6-7: Previous posterior fusion. 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 This effaces the ventral subarachnoid space and indents the ventral aspect of the cord. No abnormal cord edema however. No foraminal extension. Status post surgical intervention of the cervical region .  There is concern regarding significant component of patient's pain being due to facet arthropathy with facet syndrome. The patient has had significant relief of pain following previous cervical facet, medial branch nerve blocks. Radiofrequency rhizolysis of the cervical facet, medial branch nerves, will be performed at this time in attempt to provide longer lasting relief of the patient's pain, minimize progression of patient's symptoms, and avoid the need for more involved treatment. The risks, benefits, and  expectations of the procedure have been discussed and explained to the patient who was with understanding and wished to proceed with interventional treatment in an attempt to decrease the severely disabling pain of the cervical and upper extremity regions. We will proceed with what is felt to be, radiofrequency rhizolysis of the cervical facets (medial branch nerves) using the COOLIEF technique.   DESCRIPTION OF PROCEDURE:  COOLIEF Technique Radiofrequency Rhizolysis of the Cervical Facets (Medial Branch Nerves Radiofrequency Rhizolysis) with IV Versed IV fentanyl conscious sedation, EKG, blood pressure, pulse, pulse oximetry, and capnography monitoring. The procedure was performed with the patient in the prone position under fluoroscopic guidance. Following Betadine prep of proposed entry site, a 1.5% lidocaine plain skin wheal of the proposed needle entry site was prepared with oblique orientation of the spine.  Right C5 Radiofrequency Rhizolysis C5 facet (Medial Branch Nerves). Under fluoroscopic guidance, the radiofrequency needle was inserted at the C5 vertebral body level after a local anesthetic skin wheal of 1.5% lidocaine plain and Betadine prep of the proposed needle entry site was performed. The needle was inserted under fluoroscopic guidance in the area of the center of the articulating pillars of the cervical vertebral body. There orientation utilizing the tunnel (gun barrel) technique to the targeted area.   Right Radiofrequency rhizolysis at C4 and C3 Cervical Facets (Medial Branch Nerves) The procedure was performed at C4 and C3 exactly as was performed at the C5 vertebral body levels and under fluoroscopic guidance utilizing the  identical technique as utilized at the C5 vertebral body level.  Following documentation of radiofrequency needle placement under fluoroscopic guidance on lateral view, radiofrequency testing was then carried out with motor testing at 2 Hz stimulation without  evidence of stimulation of the upper extremity. Following radiofrequency testing for motor stimulation of the upper extremity, each radiofrequency probe was then prepared with 2 ml of preservative-free lidocaine and following 1 minute of anesthetizing each proposed radiofrequency lesioning level, the radiofrequency probe was then inserted in the right , C5 vertebral body level with radiofrequency lesioning carried out for 2-1/2 minutes with temperature of the tissue being maintained at 80C for 2-1/2 minutes. The radiofrequency probe and needle were removed.  Radiofrequency probe was then inserted in the right , C4 vertebral body level needle and radiofrequency lesioning was performed at 80C tissue temperature for 2-/2 minutes. The radiofrequency probe and needle were removed.  Radiofrequency probe was then inserted in the right , C3 to vertebral body level needle and radiofrequency lesioning was performed at 80C tissue temperature for 2-1/2 minutes. The radiofrequency probe and needle were removed.     The patient tolerated the procedure well.   PLAN  Continue present medications Opana ER and oxycodone  The patient will follow-up with PCP  Dr. Baldemar Lenis for evaluation of  BP and general medical  condition  Surgical evaluation as discussed   Neurological evaluation as discussed  The patient has been advised to call call  Pain Management Center should the patient have a change in the patient's condition or should the patient have other concerns prior to scheduled return appointment.      Review of Systems     Objective:   Physical Exam        Assessment & Plan:

## 2016-02-03 NOTE — Patient Instructions (Addendum)
PLAN    Continue present medications. Please obtain Ceftin antibiotic today and begin taking Ceftin antibiotic today as prescribed  F/U PCP Dr.Babaoff  for evaliation of  BP and general medical condition.     F/U surgical evaluation. May consider pending follow-up evaluations. Patient is status post recent neurosurgical evaluation by neurosurgeon with his surgery of the cervical region performed by Dr. Trey SailorsMark Roy  F/U neurological evaluation. May consider PNCV/EMG studies and other studies as well as evaluation of headaches pending follow-up evaluations  Patient to call Pain Management Center should patient have concerns prior to scheduled return appointmentPain Management Discharge Instructions  General Discharge Instructions :  If you need to reach your doctor call: Monday-Friday 8:00 am - 4:00 pm at [phone] or toll free [phone].  After clinic hours [phone] to have operator reach doctor.  Bring all of your medication bottles to all your appointments in the pain clinic.  To cancel or reschedule your appointment with Pain Management please remember to call 24 hours in advance to avoid a fee.  Refer to the educational materials which you have been given on: General Risks, I had my Procedure. Discharge Instructions, Post Sedation.  Post Procedure Instructions:  The drugs you were given will stay in your system until tomorrow, so for the next 24 hours you should not drive, make any legal decisions or drink any alcoholic beverages.  You may eat anything you prefer, but it is better to start with liquids then soups and crackers, and gradually work up to solid foods.  Please notify your doctor immediately if you have any unusual bleeding, trouble breathing or pain that is not related to your normal pain.  Depending on the type of procedure that was done, some parts of your body may feel week and/or numb.  This usually clears up by tonight or the next day.  Walk with the use  of an assistive device or accompanied by an adult for the 24 hours.  You may use ice on the affected area for the first 24 hours.  Put ice in a Ziploc bag and cover with a towel and place against area 15 minutes on 15 minutes off.  You may switch to heat after 24 hours.

## 2016-02-03 NOTE — Progress Notes (Signed)
Patient here for procedure d/t neck pain.  Safety precautions to be maintained throughout the outpatient stay will include: orient to surroundings, keep bed in low position, maintain call bell within reach at all times, provide assistance with transfer out of bed and ambulation.  

## 2016-02-04 ENCOUNTER — Telehealth: Payer: Self-pay | Admitting: *Deleted

## 2016-02-04 NOTE — Telephone Encounter (Signed)
Spoke with patient verbalizes no complications from procedure other than being a little sore.  Patient questions how many lesions he created and I told him he would cover that with him at his f/up appt. Patient verbalizes u/o information.

## 2016-02-25 ENCOUNTER — Encounter: Payer: Self-pay | Admitting: Pain Medicine

## 2016-02-25 ENCOUNTER — Ambulatory Visit: Payer: Medicare Other | Attending: Pain Medicine | Admitting: Pain Medicine

## 2016-02-25 VITALS — BP 138/65 | HR 78 | Temp 98.0°F | Resp 18 | Ht 69.0 in | Wt 200.0 lb

## 2016-02-25 DIAGNOSIS — M5030 Other cervical disc degeneration, unspecified cervical region: Secondary | ICD-10-CM

## 2016-02-25 DIAGNOSIS — Z9889 Other specified postprocedural states: Secondary | ICD-10-CM

## 2016-02-25 DIAGNOSIS — Z981 Arthrodesis status: Secondary | ICD-10-CM

## 2016-02-25 DIAGNOSIS — M5481 Occipital neuralgia: Secondary | ICD-10-CM

## 2016-02-25 DIAGNOSIS — M47812 Spondylosis without myelopathy or radiculopathy, cervical region: Secondary | ICD-10-CM

## 2016-02-25 MED ORDER — OXYMORPHONE HCL ER 40 MG PO TB12: ORAL_TABLET | ORAL | Status: DC

## 2016-02-25 MED ORDER — OXYCODONE HCL 10 MG PO TABS: ORAL_TABLET | ORAL | Status: DC

## 2016-02-25 NOTE — Progress Notes (Signed)
Subjective:    Patient ID: Clayton Anderson, male    DOB: 08/28/1945, 70 y.o.   MRN: [id number]  HPI  The patient is a 70 year old gentleman who returns to pain management for further evaluation and treatment of pain involving the neck with headaches as well as upper extremity pain and mid and lower back pain. The patient states that he had tremendous improvement of his pain following radiofrequency rhizolysis cervical facet medial branch nerves performed on the right side and that he is in hopes of being able to undergo the same procedure on the left side. We discussed patient's condition on today's visit and we will continue patient's medications consisting of OpanaExtended release and oxycodone. We have requested insurance approval for radiofrequency rhizolysis cervical facets on the left side. All agreed to suggested treatment plan. Patient admits to some pain involving the lower back lower extremity region of mild to moderate degree  Review of Systems     Objective:   Physical Exam  There was tenderness to palpation of paraspinal muscular treat and cervical region cervical facet region a mild degree with well-healed surgical scar of the cervical region without increased warmth and erythema in the region scar. Palpation of the cervical facets reproduced pain of moderate to moderately severe degree on the left with mild discomfort noted on the right. There was decreased range of motion of the cervical spine with rotation lateral bending and flexion extension all decrease. The patient was with unremarkable Spurling's maneuver and appeared to be with bilaterally equal grip strength. Tinel and Phalen's maneuver were without increase of pain of significant degree. Palpation over the thoracic region was with evidence of muscle spasms occurring in the subscapular region especially palpation of the lumbar region was with tenderness to palpation of the lumbar paraspinal musculature region a moderate degree as  well there was tenderness over the PSIS and PII S region a mild degree and patient was with minimal tenderness of the greater trochanteric region iliotibial band region. The patient was a straight leg raising tolerate 30 without increased pain with dorsiflexion noted. There was negative clonus negative Homans. DTRs were difficult to elicit patient had difficulty relaxing. Abdomen nontender with no costovertebral tenderness noted      Assessment & Plan:       Degenerative disc disease cervical spine Previous anterior cervical fusion from C3 through C6. Sufficient patency of the central canal and neural foramina.  C2-3: Right-sided facet arthropathy which could be a cause of pain. Mild foraminal encroachment on the right because of osteophytes.  C6-7: Previous posterior fusion. 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 This effaces the ventral subarachnoid space and indents the ventral aspect of the cord. No abnormal cord edema however. No foraminal extension. Status post surgical intervention of the cervical region  Cervical facet syndrome  Bilateral occipital neuralgia  Status post left shoulder surgery  Restless leg syndrome  Fibromyalgia   Chronic fatigue syndrome     PLAN    Continue present medications oxycodone and Opana ER  Radiofrequency rhizolysis cervical facet, medial branch nerves (left side), to be performed at time return appointment pending insurance approval  F/U PCP Dr.Babaoff  for evaliation of  BP and general medical condition.   F/U surgical evaluation. May consider pending follow-up evaluations. Patient is status post recent neurosurgical evaluation by neurosurgeon . Prior surgery of the cervical region performed by Dr. Trey SailorsMark Roy  F/U neurological evaluation. May consider PNCV/EMG studies and other studies  as well as evaluation of headaches pending follow-up evaluations  May consider radiofrequency  rhizolysis or intraspinal procedures pending response to present treatment and F/U evaluation   Patient to call Pain Management Center should patient have concerns prior to scheduled return appointment

## 2016-02-25 NOTE — Progress Notes (Signed)
Safety precautions to be maintained throughout the outpatient stay will include: orient to surroundings, keep bed in low position, maintain call bell within reach at all times, provide assistance with transfer out of bed and ambulation.  

## 2016-02-25 NOTE — Patient Instructions (Addendum)
PLAN    Continue present medications oxycodone and Opana ER  Radiofrequency rhizolysis cervical facet, medial branch nerves (left side), to be performed at time return appointment pending insurance approval  F/U PCP Dr.Babaoff  for evaliation of  BP and general medical condition.   F/U surgical evaluation. May consider pending follow-up evaluations. Patient is status post recent neurosurgical evaluation by neurosurgeon . Prior surgery of the cervical region performed by Dr. Trey SailorsMark Roy  F/U neurological evaluation. May consider PNCV/EMG studies and other studies as well as evaluation of headaches pending follow-up evaluations  May consider radiofrequency rhizolysis or intraspinal procedures pending response to present treatment and F/U evaluation   Patient to call Pain Management Center should patient have concerns prior to scheduled return appointment.Radiofrequency Lesioning Radiofrequency lesioning is a procedure that is performed to relieve pain. The procedure is often used for back, neck, or arm pain. Radiofrequency lesioning involves the use of a machine that creates radio waves to make heat. During the procedure, the heat is applied to the nerve that carries the pain signal. The heat damages the nerve and interferes with the pain signal. Pain relief usually lasts for 6 months to 1 year. LET [facility]ARE PROVIDER KNOW ABOUT:  Any allergies you have.  All medicines you are taking, including vitamins, herbs, eye drops, creams, and over-the-counter medicines.  Previous problems you or members of your family have had with the use of anesthetics.  Any blood disorders you have.  Previous surgeries you have had.  Any medical conditions you have.  Whether you are pregnant or may be pregnant. RISKS AND COMPLICATIONS Generally, this is a safe procedure. However, problems may occur, including:  Pain or soreness at the injection site.  Infection at the injection site.  Damage to  nerves or blood vessels. BEFORE THE PROCEDURE  Ask your health care provider about:  Changing or stopping your regular medicines. This is especially important if you are taking diabetes medicines or blood thinners.  Taking medicines such as aspirin and ibuprofen. These medicines can thin your blood. Do not take these medicines before your procedure if your health care provider instructs you not to.  Follow instructions from your health care provider about eating or drinking restrictions.  Plan to have someone take you home after the procedure.  If you go home right after the procedure, plan to have someone with you for 24 hours. PROCEDURE  You will be given one or more of the following:  A medicine to help you relax (sedative).  A medicine to numb the area (local anesthetic).  You will be awake during the procedure. You will need to be able to talk with the health care provider during the procedure.  With the help of a type of X-ray (fluoroscopy), the health care provider will insert a radiofrequency needle into the area to be treated.  Next, a wire that carries the radio waves (electrode) will be put through the radiofrequency needle. An electrical pulse will be sent through the electrode to verify the correct nerve. You will feel a tingling sensation, and you may have muscle twitching.  Then, the tissue that is around the needle tip will be heated by an electric current that is passed using the radiofrequency machine. This will numb the nerves.  A bandage (dressing) will be put on the insertion area after the procedure is done. The procedure may vary among health care providers and hospitals. AFTER THE PROCEDURE  Your blood pressure, heart rate, breathing rate, and  blood oxygen level will be monitored often until the medicines you were given have worn off.  Return to your normal activities as directed by your health care provider.   This information is not intended to replace  advice given to you by your health care provider. Make sure you discuss any questions you have with your health care provider.   Document Released: 03/25/2011 Document Revised: 04/17/2015 Document Reviewed: 09/03/2014 Elsevier Interactive Patient Education 2016 [employer] GENERAL RISKS AND COMPLICATIONS  What are the risk, side effects and possible complications? Generally speaking, most procedures are safe.  However, with any procedure there are risks, side effects, and the possibility of complications.  The risks and complications are dependent upon the sites that are lesioned, or the type of nerve block to be performed.  The closer the procedure is to the spine, the more serious the risks are.  Great care is taken when placing the radio frequency needles, block needles or lesioning probes, but sometimes complications can occur.  Infection: Any time there is an injection through the skin, there is a risk of infection.  This is why sterile conditions are used for these blocks.  There are four possible types of infection.  Localized skin infection.  Central Nervous System Infection-This can be in the form of Meningitis, which can be deadly.  Epidural Infections-This can be in the form of an epidural abscess, which can cause pressure inside of the spine, causing compression of the spinal cord with subsequent paralysis. This would require an emergency surgery to decompress, and there are no guarantees that the patient would recover from the paralysis.  Discitis-This is an infection of the intervertebral discs.  It occurs in about 1% of discography procedures.  It is difficult to treat and it may lead to surgery.        2. Pain: the needles have to go through skin and soft tissues, will cause soreness.       3. Damage to internal structures:  The nerves to be lesioned may be near blood vessels or    other nerves which can be potentially damaged.       4. Bleeding: Bleeding is more common if  the patient is taking blood thinners such as  aspirin, Coumadin, Ticiid, Plavix, etc., or if he/she have some genetic predisposition  such as hemophilia. Bleeding into the spinal canal can cause compression of the spinal  cord with subsequent paralysis.  This would require an emergency surgery to  decompress and there are no guarantees that the patient would recover from the  paralysis.       5. Pneumothorax:  Puncturing of a lung is a possibility, every time a needle is introduced in  the area of the chest or upper back.  Pneumothorax refers to free air around the  collapsed lung(s), inside of the thoracic cavity (chest cavity).  Another two possible  complications related to a similar event would include: Hemothorax and Chylothorax.   These are variations of the Pneumothorax, where instead of air around the collapsed  lung(s), you may have blood or chyle, respectively.       6. Spinal headaches: They may occur with any procedures in the area of the spine.       7. Persistent CSF (Cerebro-Spinal Fluid) leakage: This is a rare problem, but may occur  with prolonged intrathecal or epidural catheters either due to the formation of a fistulous  track or a dural tear.  8. Nerve damage: By working so close to the spinal cord, there is always a possibility of  nerve damage, which could be as serious as a permanent spinal cord injury with  paralysis.       9. Death:  Although rare, severe deadly allergic reactions known as "Anaphylactic  reaction" can occur to any of the medications used.      10. Worsening of the symptoms:  We can always make thing worse.  What are the chances of something like this happening? Chances of any of this occuring are extremely low.  By statistics, you have more of a chance of getting killed in a motor vehicle accident: while driving to the hospital than any of the above occurring .  Nevertheless, you should be aware that they are possibilities.  In general, it is similar to  taking a shower.  Everybody knows that you can slip, hit your head and get killed.  Does that mean that you should not shower again?  Nevertheless always keep in mind that statistics do not mean anything if you happen to be on the wrong side of them.  Even if a procedure has a 1 (one) in a 1,000,000 (million) chance of going wrong, it you happen to be that one..Also, keep in mind that by statistics, you have more of a chance of having something go wrong when taking medications.  Who should not have this procedure? If you are on a blood thinning medication (e.g. Coumadin, Plavix, see list of "Blood Thinners"), or if you have an active infection going on, you should not have the procedure.  If you are taking any blood thinners, please inform your physician.  How should I prepare for this procedure?  Do not eat or drink anything at least six hours prior to the procedure.  Bring a driver with you .  It cannot be a taxi.  Come accompanied by an adult that can drive you back, and that is strong enough to help you if your legs get weak or numb from the local anesthetic.  Take all of your medicines the morning of the procedure with just enough water to swallow them.  If you have diabetes, make sure that you are scheduled to have your procedure done first thing in the morning, whenever possible.  If you have diabetes, take only half of your insulin dose and notify our nurse that you have done so as soon as you arrive at the clinic.  If you are diabetic, but only take blood sugar pills (oral hypoglycemic), then do not take them on the morning of your procedure.  You may take them after you have had the procedure.  Do not take aspirin or any aspirin-containing medications, at least eleven (11) days prior to the procedure.  They may prolong bleeding.  Wear loose fitting clothing that may be easy to take off and that you would not mind if it got stained with Betadine or blood.  Do not wear any jewelry or  perfume  Remove any nail coloring.  It will interfere with some of our monitoring equipment.  NOTE: Remember that this is not meant to be interpreted as a complete list of all possible complications.  Unforeseen problems may occur.  BLOOD THINNERS The following drugs contain aspirin or other products, which can cause increased bleeding during surgery and should not be taken for 2 weeks prior to and 1 week after surgery.  If you should need take something for relief of minor  pain, you may take acetaminophen which is found in Tylenol,m Datril, Anacin-3 and Panadol. It is not blood thinner. The products listed below are.  Do not take any of the products listed below in addition to any listed on your instruction sheet.  A.P.C or A.P.C with Codeine Codeine Phosphate Capsules #3 Ibuprofen Ridaura  ABC compound Congesprin Imuran rimadil  Advil Cope Indocin Robaxisal  Alka-Seltzer Effervescent Pain Reliever and Antacid Coricidin or Coricidin-D  Indomethacin Rufen  Alka-Seltzer plus Cold Medicine Cosprin Ketoprofen S-A-C Tablets  Anacin Analgesic Tablets or Capsules Coumadin Korlgesic Salflex  Anacin Extra Strength Analgesic tablets or capsules CP-2 Tablets Lanoril Salicylate  Anaprox Cuprimine Capsules Levenox Salocol  Anexsia-D Dalteparin Magan Salsalate  Anodynos Darvon compound Magnesium Salicylate Sine-off  Ansaid Dasin Capsules Magsal Sodium Salicylate  Anturane Depen Capsules Marnal Soma  APF Arthritis pain formula Dewitt's Pills Measurin Stanback  Argesic Dia-Gesic Meclofenamic Sulfinpyrazone  Arthritis Bayer Timed Release Aspirin Diclofenac Meclomen Sulindac  Arthritis pain formula Anacin Dicumarol Medipren Supac  Analgesic (Safety coated) Arthralgen Diffunasal Mefanamic Suprofen  Arthritis Strength Bufferin Dihydrocodeine Mepro Compound Suprol  Arthropan liquid Dopirydamole Methcarbomol with Aspirin Synalgos  ASA tablets/Enseals Disalcid Micrainin Tagament  Ascriptin Doan's Midol  Talwin  Ascriptin A/D Dolene Mobidin Tanderil  Ascriptin Extra Strength Dolobid Moblgesic Ticlid  Ascriptin with Codeine Doloprin or Doloprin with Codeine Momentum Tolectin  Asperbuf Duoprin Mono-gesic Trendar  Aspergum Duradyne Motrin or Motrin IB Triminicin  Aspirin plain, buffered or enteric coated Durasal Myochrisine Trigesic  Aspirin Suppositories Easprin Nalfon Trillsate  Aspirin with Codeine Ecotrin Regular or Extra Strength Naprosyn Uracel  Atromid-S Efficin Naproxen Ursinus  Auranofin Capsules Elmiron Neocylate Vanquish  Axotal Emagrin Norgesic Verin  Azathioprine Empirin or Empirin with Codeine Normiflo Vitamin E  Azolid Emprazil Nuprin Voltaren  Bayer Aspirin plain, buffered or children's or timed BC Tablets or powders Encaprin Orgaran Warfarin Sodium  Buff-a-Comp Enoxaparin Orudis Zorpin  Buff-a-Comp with Codeine Equegesic Os-Cal-Gesic   Buffaprin Excedrin plain, buffered or Extra Strength Oxalid   Bufferin Arthritis Strength Feldene Oxphenbutazone   Bufferin plain or Extra Strength Feldene Capsules Oxycodone with Aspirin   Bufferin with Codeine Fenoprofen Fenoprofen Pabalate or Pabalate-SF   Buffets II Flogesic Panagesic   Buffinol plain or Extra Strength Florinal or Florinal with Codeine Panwarfarin   Buf-Tabs Flurbiprofen Penicillamine   Butalbital Compound Four-way cold tablets Penicillin   Butazolidin Fragmin Pepto-Bismol   Carbenicillin Geminisyn Percodan   Carna Arthritis Reliever Geopen Persantine   Carprofen Gold's salt Persistin   Chloramphenicol Goody's Phenylbutazone   Chloromycetin Haltrain Piroxlcam   Clmetidine heparin Plaquenil   Cllnoril Hyco-pap Ponstel   Clofibrate Hydroxy chloroquine Propoxyphen         Before stopping any of these medications, be sure to consult the physician who ordered them.  Some, such as Coumadin (Warfarin) are ordered to prevent or treat serious conditions such as "deep thrombosis", "pumonary embolisms", and other heart  problems.  The amount of time that you may need off of the medication may also vary with the medication and the reason for which you were taking it.  If you are taking any of these medications, please make sure you notify your pain physician before you undergo any procedures.

## 2016-03-09 ENCOUNTER — Other Ambulatory Visit: Payer: Self-pay | Admitting: Pain Medicine

## 2016-03-16 ENCOUNTER — Encounter: Payer: Self-pay | Admitting: Pain Medicine

## 2016-03-16 ENCOUNTER — Ambulatory Visit: Payer: Medicare Other | Attending: Pain Medicine | Admitting: Pain Medicine

## 2016-03-16 VITALS — BP 150/76 | HR 64 | Temp 97.7°F | Resp 16 | Ht 69.0 in | Wt 200.0 lb

## 2016-03-16 DIAGNOSIS — Z981 Arthrodesis status: Secondary | ICD-10-CM | POA: Diagnosis not present

## 2016-03-16 DIAGNOSIS — M542 Cervicalgia: Secondary | ICD-10-CM | POA: Diagnosis present

## 2016-03-16 DIAGNOSIS — M50223 Other cervical disc displacement at C6-C7 level: Secondary | ICD-10-CM | POA: Insufficient documentation

## 2016-03-16 DIAGNOSIS — M2578 Osteophyte, vertebrae: Secondary | ICD-10-CM | POA: Diagnosis not present

## 2016-03-16 DIAGNOSIS — M47812 Spondylosis without myelopathy or radiculopathy, cervical region: Secondary | ICD-10-CM

## 2016-03-16 DIAGNOSIS — M5481 Occipital neuralgia: Secondary | ICD-10-CM

## 2016-03-16 DIAGNOSIS — Z9889 Other specified postprocedural states: Secondary | ICD-10-CM

## 2016-03-16 DIAGNOSIS — M5030 Other cervical disc degeneration, unspecified cervical region: Secondary | ICD-10-CM | POA: Insufficient documentation

## 2016-03-16 MED ORDER — CEFAZOLIN SODIUM 1 G IJ SOLR
INTRAMUSCULAR | Status: AC
  Administered 2016-03-16: 15:00:00
  Filled 2016-03-16: qty 10

## 2016-03-16 MED ORDER — LIDOCAINE HCL (PF) 1 % IJ SOLN
INTRAMUSCULAR | Status: AC
  Filled 2016-03-16: qty 5

## 2016-03-16 MED ORDER — CEFAZOLIN IN D5W 1 GM/50ML IV SOLN: 1.0000 g | Freq: Once | INTRAVENOUS | Status: DC

## 2016-03-16 MED ORDER — ORPHENADRINE CITRATE 30 MG/ML IJ SOLN
60.0000 mg | Freq: Once | INTRAMUSCULAR | Status: AC
  Administered 2016-03-16: 60 mg via INTRAMUSCULAR
  Filled 2016-03-16: qty 2

## 2016-03-16 MED ORDER — BUPIVACAINE HCL (PF) 0.25 % IJ SOLN
30.0000 mL | Freq: Once | INTRAMUSCULAR | Status: AC
  Administered 2016-03-16: 30 mL
  Filled 2016-03-16: qty 30

## 2016-03-16 MED ORDER — STERILE WATER FOR INJECTION IJ SOLN
INTRAMUSCULAR | Status: AC
  Filled 2016-03-16: qty 10

## 2016-03-16 MED ORDER — LIDOCAINE HCL (PF) 1 % IJ SOLN
INTRAMUSCULAR | Status: AC
  Filled 2016-03-16: qty 10

## 2016-03-16 MED ORDER — LIDOCAINE HCL (PF) 1 % IJ SOLN
10.0000 mL | Freq: Once | INTRAMUSCULAR | Status: AC
  Administered 2016-03-16: 10 mL via SUBCUTANEOUS

## 2016-03-16 MED ORDER — STERILE WATER FOR INJECTION IJ SOLN
INTRAMUSCULAR | Status: AC
  Filled 2016-03-16: qty 40

## 2016-03-16 MED ORDER — FENTANYL CITRATE (PF) 100 MCG/2ML IJ SOLN
100.0000 ug | Freq: Once | INTRAMUSCULAR | Status: AC
  Administered 2016-03-16: 100 ug via INTRAVENOUS
  Filled 2016-03-16: qty 2

## 2016-03-16 MED ORDER — MIDAZOLAM HCL 5 MG/5ML IJ SOLN
5.0000 mg | Freq: Once | INTRAMUSCULAR | Status: AC
  Administered 2016-03-16: 5 mg via INTRAVENOUS
  Filled 2016-03-16: qty 5

## 2016-03-16 MED ORDER — LACTATED RINGERS IV SOLN: 1000.0000 mL | INTRAVENOUS | Status: DC

## 2016-03-16 MED ORDER — CEFUROXIME AXETIL 250 MG PO TABS: 250.0000 mg | ORAL_TABLET | Freq: Two times a day (BID) | ORAL | 0 refills | Status: DC

## 2016-03-16 MED ORDER — SODIUM CHLORIDE 0.9% FLUSH: 20.0000 mL | Freq: Once | INTRAVENOUS | Status: DC

## 2016-03-16 MED ORDER — STERILE WATER FOR INJECTION IJ SOLN
INTRAMUSCULAR | Status: AC
  Filled 2016-03-16: qty 30

## 2016-03-16 NOTE — Progress Notes (Signed)
COOLIEF RADIOFREQUENCY RHIZOLYSIS CERVICAL FACETS                                          (MEDIAL BRANCH NERVES)    PROCEDURE PERFORMED: Left Side Radiofrequency Rhizolysis Cervical Facets (Medial Branch Nerves)  The procedure was performed with the Coolief technique. The Coolief technique was performed using the Synergy cooled radiofrequency probe CRK 17 75-2 the synergy cooled radiofrequency introducer, and the synergy cooled sterile tube kit  HISTORY:  The patient is a 70 year old gentleman who returns to the pain management center for further evaluation and treatment of severely disabling pain involving the cervical and upper extremity regions. 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 Sufficient patency of the central canal and neural foramina.  C2-3: Right-sided facet arthropathy which could be a cause of pain. Mild foraminal encroachment on the right because of osteophytes.  C6-7: Previous posterior fusion. 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 This effaces the ventral subarachnoid space and indents the ventral aspect of the cord. No abnormal cord edema however. No foraminal extension. Status post surgical intervention of the cervical region .  There is concern regarding significant component of patient's pain being due to facet arthropathy with facet syndrome. The patient has had significant relief of pain following previous cervical facet, medial branch nerve blocks. Radiofrequency rhizolysis of the cervical facet, medial branch nerves, will be performed at this time in attempt to provide longer lasting relief of the patient's pain, minimize progression of patient's symptoms, and avoid the need for more involved treatment. The risks, benefits, and expectations of the procedure have been discussed and explained to the patient who was with  understanding and wished to proceed with interventional treatment in an attempt to decrease the severely disabling pain of the cervical and upper extremity regions. We will proceed with what is felt to be, radiofrequency rhizolysis of the cervical facets (medial branch nerves) using the COOLIEF technique.   DESCRIPTION OF PROCEDURE:  COOLIEF Technique Radiofrequency Rhizolysis of the Cervical Facets (Medial Branch Nerves Radiofrequency Rhizolysis) with IV Versed IV fentanyl conscious sedation, EKG, blood pressure, pulse, pulse oximetry, and capnography monitoring. The procedure was performed with the patient in the prone position under fluoroscopic guidance. Following Betadine prep of proposed entry site, a 1.5% lidocaine plain skin wheal of the proposed needle entry site was prepared with oblique orientation of the spine.  Left C5 Radiofrequency Rhizolysis C5 facet (Medial Branch Nerves). Under fluoroscopic guidance, the radiofrequency needle was inserted at the C5 vertebral body level after a local anesthetic skin wheal of 1.5% lidocaine plain and Betadine prep of the proposed needle entry site was performed. The needle was inserted under fluoroscopic guidance in the area of the center of the articulating pillars of the cervical vertebral body. There orientation utilizing the tunnel (gun barrel) technique to the targeted area.   Left Radiofrequency rhizolysis at C4 and C3 Cervical Facets (Medial Branch Nerves) The procedure was performed at C4 and C3 exactly as was performed at the C5  vertebral body levels and under fluoroscopic guidance utilizing the identical technique as utilized at the C5 vertebral body level.  Following documentation of radiofrequency needle placement under fluoroscopic guidance  on lateral view, radiofrequency testing was then carried out with motor testing at 2 Hz stimulation without evidence of stimulation of the upper extremity. Following radiofrequency testing for motor  stimulation of the upper extremity, each radiofrequency probe was then prepared with 2 ml of preservative-free lidocaine and following 1 minute of anesthetizing each proposed radiofrequency lesioning level, the radiofrequency probe was then inserted in the left , C5 vertebral body level with radiofrequency lesioning carried out for 2-1/2 minutes with temperature of the tissue being maintained at 80C for 2-1/2 minutes. The radiofrequency probe and needle were removed.  Radiofrequency probe was then inserted in the left , C4 vertebral body level needle and radiofrequency lesioning was performed at 80C tissue temperature for 2-/2 minutes. The radiofrequency probe and needle were removed.  Radiofrequency probe was then inserted in the left , C3 vertebral body level needle and radiofrequency lesioning was performed at 80C tissue temperature for 2-1/2 minutes. The radiofrequency probe and needle were removed.     The patient tolerated the procedure well.   PLAN  Continue present medications oxycodone and Opana ER  The patient will follow-up with PCP Dr. Baldemar Lenis for evaluation of  BP and general medical  condition  Surgical evaluation as discussed   Neurological evaluation as discussed  The patient has been advised to call call  Pain Management Center should the patient have a change in the patient's condition or should the patient have other concerns prior to scheduled return appointment.

## 2016-03-16 NOTE — Progress Notes (Signed)
Ice pack applied to neck post procedure.

## 2016-03-16 NOTE — Patient Instructions (Signed)
PLAN    Continue present medications. Please obtain Ceftin antibiotic today and begin taking Ceftin antibiotic today as prescribed  F/U PCP Dr.Babaoff  for evaliation of  BP and general medical condition.     F/U surgical evaluation. May consider pending follow-up evaluations. Patient is status post recent neurosurgical evaluation by neurosurgeon with his surgery of the cervical region performed by Dr. Trey SailorsMark Roy  F/U neurological evaluation. May consider PNCV/EMG studies and other studies as well as evaluation of headaches pending follow-up evaluations  Patient to call Pain Management Center should patient have concerns prior to scheduled return appointment

## 2016-03-17 ENCOUNTER — Telehealth: Payer: Self-pay | Admitting: *Deleted

## 2016-03-17 NOTE — Telephone Encounter (Signed)
Attempted to call patient for post procedure follow up. No answer.

## 2016-03-26 ENCOUNTER — Encounter: Payer: Self-pay | Admitting: Pain Medicine

## 2016-03-26 ENCOUNTER — Ambulatory Visit: Payer: Medicare Other | Attending: Pain Medicine | Admitting: Pain Medicine

## 2016-03-26 VITALS — BP 165/79 | HR 72 | Temp 97.6°F | Resp 18 | Ht 69.0 in | Wt 200.0 lb

## 2016-03-26 DIAGNOSIS — M50223 Other cervical disc displacement at C6-C7 level: Secondary | ICD-10-CM | POA: Diagnosis not present

## 2016-03-26 DIAGNOSIS — Z981 Arthrodesis status: Secondary | ICD-10-CM | POA: Insufficient documentation

## 2016-03-26 DIAGNOSIS — R5382 Chronic fatigue, unspecified: Secondary | ICD-10-CM | POA: Diagnosis not present

## 2016-03-26 DIAGNOSIS — Z9889 Other specified postprocedural states: Secondary | ICD-10-CM | POA: Insufficient documentation

## 2016-03-26 DIAGNOSIS — M5030 Other cervical disc degeneration, unspecified cervical region: Secondary | ICD-10-CM | POA: Diagnosis not present

## 2016-03-26 DIAGNOSIS — G2581 Restless legs syndrome: Secondary | ICD-10-CM | POA: Insufficient documentation

## 2016-03-26 DIAGNOSIS — M542 Cervicalgia: Secondary | ICD-10-CM | POA: Diagnosis present

## 2016-03-26 DIAGNOSIS — M5481 Occipital neuralgia: Secondary | ICD-10-CM | POA: Diagnosis not present

## 2016-03-26 DIAGNOSIS — M1288 Other specific arthropathies, not elsewhere classified, other specified site: Secondary | ICD-10-CM | POA: Diagnosis not present

## 2016-03-26 DIAGNOSIS — M5090 Cervical disc disorder, unspecified, unspecified cervical region: Secondary | ICD-10-CM

## 2016-03-26 DIAGNOSIS — M797 Fibromyalgia: Secondary | ICD-10-CM | POA: Diagnosis not present

## 2016-03-26 DIAGNOSIS — M2578 Osteophyte, vertebrae: Secondary | ICD-10-CM | POA: Insufficient documentation

## 2016-03-26 DIAGNOSIS — M47812 Spondylosis without myelopathy or radiculopathy, cervical region: Secondary | ICD-10-CM

## 2016-03-26 MED ORDER — OXYMORPHONE HCL ER 40 MG PO TB12: ORAL_TABLET | ORAL | 0 refills | Status: DC

## 2016-03-26 MED ORDER — OXYCODONE HCL 10 MG PO TABS: ORAL_TABLET | ORAL | 0 refills | Status: DC

## 2016-03-26 NOTE — Patient Instructions (Addendum)
PLAN    Continue present medications oxycodone and Opana ER CAUTION  DUE  NOT TAKE ANY OXYCODONE for the first 3 days while taking the increased dose of oxymorphone (Opana ER). Your medications have significant side effects CAUTION Medication can cause respiratory depression and cause you to stop breathing, cause excessive sedation, cause confusion and other side effects.  Exercise extreme caution when taking medication and call EMS or go to the Emergency Department immediately if you develop any of these symptoms   F/U PCP Dr.Babaoff  for evaliation of  BP and general medical condition.   F/U surgical evaluation. May consider pending follow-up evaluations. Patient is status post recent neurosurgical evaluation by neurosurgeon . Prior surgery of the cervical region performed by Dr. Trey SailorsMark Roy  F/U neurological evaluation. May consider PNCV/EMG studies and other studies as well as evaluation of headaches pending follow-up evaluations  May consider additional radiofrequency rhizolysis or intraspinal procedures pending response to present treatment and F/U evaluation   Patient to call Pain Management Center should patient have concerns prior to scheduled return appointment.  Oxycodone and Opana ER scripts given to patient.

## 2016-03-26 NOTE — Progress Notes (Signed)
The patient is a 70 year old 76joint who returns to pain management for further evaluation and treatment of pain involving the region of the neck upper extremity region as well as lower back and lower extremity region. The patient is status post radiofrequency rhizolysis cervical facet, medial branch nerves on the right as well as on the left with recent interventional treatment on the left side. The patient has had significant improvement of pain involving the neck following radiofrequency procedure. The patient is without any undesirable side effects due to medications consisting of Opana ER oxycodone acetaminophen for breakthrough pain. We discussed patient's condition on today's visit and we will modify patient's Opana at this time and we will remain available to consider additional modifications of treatment pending follow-up evaluation. The patient was with understanding and agreement suggested treatment plan.      Physical examination  There was tenderness of the paraspinal musculature and cervical region cervical facet region a mild degree with mild tenderness of the splenius capitis and occipitalis region. Palpation over the thoracic facet and cervical facet region was with mild tenderness to palpation with no crepitus of the thoracic region noted. Palpation of the acromioclavicular and glenohumeral joint region was with minimal discomfort and patient was able to perform drop test without difficulty and appeared to be with unremarkable Spurling's maneuver. Palpation of the thoracic region was with no significant increase of pain or muscle spasm. Palpation over the lumbar region was with minimal tense to palpation with lateral bending rotation extension and palpation of the lumbar facets reproducing minimal discomfort. Palpation of the PSIS and PII S region was without increased pain of significant degree with minimal tenderness of the greater trochanteric region iliotibial band region.  Straight leg raise was tolerated to 30 without increased pain with dorsiflexion noted. DTRs appeared to be trace at the knees with negative clonus negative Homans. Abdomen nontender with no costovertebral tenderness noted     Assessment      Degenerative disc disease cervical spine Previous anterior cervical fusion from C3 through C6. Sufficient patency of the central canal and neural foramina.  C2-3: Right-sided facet arthropathy which could be a cause of pain. Mild foraminal encroachment on the right because of osteophytes.  C6-7: Previous posterior fusion. 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 This effaces the ventral subarachnoid space and indents the ventral aspect of the cord. No abnormal cord edema however. No foraminal extension. Status post surgical intervention of the cervical region  Cervical facet syndrome  Bilateral occipital neuralgia  Status post left shoulder surgery  Restless leg syndrome  Fibromyalgia   Chronic fatigue syndrome     PLAN    Continue present medications oxycodone and Opana ER CAUTION  DUE  NOT TAKE ANY OXYCODONE for the first 3 days while taking the increased dose of oxymorphone (Opana ER.) Your medications have significant side effects CAUTION Medication can cause respiratory depression and cause you to stop breathing, cause excessive sedation, cause confusion and other side effects.  Exercise extreme caution when taking medication and call EMS or go to the Emergency Department immediately if you develop any of these symptoms   F/U PCP Dr.Babaoff  for evaliation of  BP and general medical condition.   F/U surgical evaluation. May consider pending follow-up evaluations. Patient is status post recent neurosurgical evaluation by neurosurgeon . Prior surgery of the cervical region performed by Dr. Trey SailorsMark Roy  F/U neurological evaluation. May consider PNCV/EMG studies and other studies as  well  as evaluation of headaches pending follow-up evaluations  May consider additional radiofrequency rhizolysis or intraspinal procedures pending response to present treatment and F/U evaluation   Patient to call Pain Management Center should patient have concerns prior to scheduled return appointment.  Oxycodone and Opana ER scripts given to patient.

## 2016-03-26 NOTE — Progress Notes (Signed)
Safety precautions to be maintained throughout the outpatient stay will include: orient to surroundings, keep bed in low position, maintain call bell within reach at all times, provide assistance with transfer out of bed and ambulation.  

## 2016-04-14 ENCOUNTER — Telehealth: Payer: Self-pay | Admitting: *Deleted

## 2016-04-22 ENCOUNTER — Other Ambulatory Visit: Payer: Self-pay | Admitting: Pain Medicine

## 2016-04-27 ENCOUNTER — Ambulatory Visit: Payer: Medicare Other | Admitting: Pain Medicine

## 2017-01-06 ENCOUNTER — Other Ambulatory Visit (HOSPITAL_COMMUNITY): Payer: Self-pay | Admitting: Pain Medicine

## 2017-01-06 DIAGNOSIS — M542 Cervicalgia: Secondary | ICD-10-CM

## 2017-01-13 ENCOUNTER — Ambulatory Visit
Admission: RE | Admit: 2017-01-13 | Discharge: 2017-01-13 | Disposition: A | Discharge status: Home / Self Care | Payer: Medicare Other | Source: Ambulatory Visit | Attending: Pain Medicine | Admitting: Pain Medicine

## 2017-01-13 DIAGNOSIS — M542 Cervicalgia: Secondary | ICD-10-CM

## 2017-01-13 DIAGNOSIS — T1490XA Injury, unspecified, initial encounter: Secondary | ICD-10-CM | POA: Diagnosis present

## 2017-01-13 DIAGNOSIS — M50223 Other cervical disc displacement at C6-C7 level: Secondary | ICD-10-CM | POA: Diagnosis not present

## 2017-01-13 DIAGNOSIS — M4802 Spinal stenosis, cervical region: Secondary | ICD-10-CM | POA: Diagnosis not present

## 2017-01-13 DIAGNOSIS — Z981 Arthrodesis status: Secondary | ICD-10-CM | POA: Insufficient documentation

## 2017-01-15 ENCOUNTER — Ambulatory Visit
Admission: RE | Admit: 2017-01-15 | Discharge: 2017-01-15 | Disposition: A | Discharge status: Home / Self Care | Payer: Medicare Other | Source: Ambulatory Visit | Attending: Physical Medicine and Rehabilitation | Admitting: Physical Medicine and Rehabilitation

## 2017-01-15 ENCOUNTER — Other Ambulatory Visit: Payer: Self-pay | Admitting: Physical Medicine and Rehabilitation

## 2017-01-15 DIAGNOSIS — T148XXA Other injury of unspecified body region, initial encounter: Secondary | ICD-10-CM

## 2017-07-02 ENCOUNTER — Other Ambulatory Visit: Payer: Self-pay

## 2017-07-02 ENCOUNTER — Inpatient Hospital Stay (HOSPITAL_COMMUNITY)
Admission: EM | Disposition: A | Discharge status: Home / Self Care | Payer: Self-pay | Source: Home / Self Care | Attending: Cardiology

## 2017-07-02 ENCOUNTER — Inpatient Hospital Stay (HOSPITAL_COMMUNITY): Payer: Medicare Other

## 2017-07-02 ENCOUNTER — Emergency Department (HOSPITAL_COMMUNITY): Payer: Medicare Other

## 2017-07-02 ENCOUNTER — Encounter (HOSPITAL_COMMUNITY)
Admission: EM | Disposition: A | Discharge status: Home / Self Care | Payer: Self-pay | Source: Home / Self Care | Attending: Cardiology

## 2017-07-02 ENCOUNTER — Inpatient Hospital Stay (HOSPITAL_COMMUNITY)
Admission: EM | Admit: 2017-07-02 | Discharge: 2017-07-05 | DRG: 246 | Disposition: A | Discharge status: Home / Self Care | Payer: Medicare Other | Attending: Cardiology | Admitting: Cardiology

## 2017-07-02 ENCOUNTER — Encounter (HOSPITAL_COMMUNITY): Payer: Self-pay

## 2017-07-02 DIAGNOSIS — I200 Unstable angina: Secondary | ICD-10-CM | POA: Diagnosis present

## 2017-07-02 DIAGNOSIS — I4892 Unspecified atrial flutter: Secondary | ICD-10-CM | POA: Diagnosis not present

## 2017-07-02 DIAGNOSIS — I21A9 Other myocardial infarction type: Secondary | ICD-10-CM | POA: Diagnosis not present

## 2017-07-02 DIAGNOSIS — I4891 Unspecified atrial fibrillation: Secondary | ICD-10-CM | POA: Diagnosis not present

## 2017-07-02 DIAGNOSIS — R55 Syncope and collapse: Secondary | ICD-10-CM | POA: Diagnosis present

## 2017-07-02 DIAGNOSIS — Z887 Allergy status to serum and vaccine status: Secondary | ICD-10-CM

## 2017-07-02 DIAGNOSIS — I480 Paroxysmal atrial fibrillation: Secondary | ICD-10-CM

## 2017-07-02 DIAGNOSIS — Z87891 Personal history of nicotine dependence: Secondary | ICD-10-CM

## 2017-07-02 DIAGNOSIS — Z79899 Other long term (current) drug therapy: Secondary | ICD-10-CM | POA: Diagnosis not present

## 2017-07-02 DIAGNOSIS — I25110 Atherosclerotic heart disease of native coronary artery with unstable angina pectoris: Secondary | ICD-10-CM | POA: Diagnosis present

## 2017-07-02 DIAGNOSIS — F419 Anxiety disorder, unspecified: Secondary | ICD-10-CM | POA: Diagnosis present

## 2017-07-02 DIAGNOSIS — I2510 Atherosclerotic heart disease of native coronary artery without angina pectoris: Secondary | ICD-10-CM

## 2017-07-02 DIAGNOSIS — I2583 Coronary atherosclerosis due to lipid rich plaque: Secondary | ICD-10-CM

## 2017-07-02 DIAGNOSIS — I97190 Other postprocedural cardiac functional disturbances following cardiac surgery: Secondary | ICD-10-CM | POA: Diagnosis not present

## 2017-07-02 DIAGNOSIS — I340 Nonrheumatic mitral (valve) insufficiency: Secondary | ICD-10-CM | POA: Diagnosis not present

## 2017-07-02 DIAGNOSIS — R52 Pain, unspecified: Secondary | ICD-10-CM

## 2017-07-02 DIAGNOSIS — F329 Major depressive disorder, single episode, unspecified: Secondary | ICD-10-CM | POA: Diagnosis present

## 2017-07-02 DIAGNOSIS — S82401A Unspecified fracture of shaft of right fibula, initial encounter for closed fracture: Secondary | ICD-10-CM | POA: Diagnosis present

## 2017-07-02 DIAGNOSIS — E785 Hyperlipidemia, unspecified: Secondary | ICD-10-CM | POA: Diagnosis present

## 2017-07-02 DIAGNOSIS — I2542 Coronary artery dissection: Secondary | ICD-10-CM | POA: Diagnosis not present

## 2017-07-02 DIAGNOSIS — I2102 ST elevation (STEMI) myocardial infarction involving left anterior descending coronary artery: Secondary | ICD-10-CM | POA: Diagnosis not present

## 2017-07-02 DIAGNOSIS — I214 Non-ST elevation (NSTEMI) myocardial infarction: Principal | ICD-10-CM | POA: Diagnosis present

## 2017-07-02 DIAGNOSIS — T82897A Other specified complication of cardiac prosthetic devices, implants and grafts, initial encounter: Secondary | ICD-10-CM | POA: Diagnosis not present

## 2017-07-02 DIAGNOSIS — I119 Hypertensive heart disease without heart failure: Secondary | ICD-10-CM | POA: Diagnosis present

## 2017-07-02 DIAGNOSIS — S93601A Unspecified sprain of right foot, initial encounter: Secondary | ICD-10-CM | POA: Diagnosis present

## 2017-07-02 DIAGNOSIS — Z7982 Long term (current) use of aspirin: Secondary | ICD-10-CM

## 2017-07-02 DIAGNOSIS — Z882 Allergy status to sulfonamides status: Secondary | ICD-10-CM

## 2017-07-02 HISTORY — PX: LEFT HEART CATH AND CORONARY ANGIOGRAPHY: CATH118249

## 2017-07-02 HISTORY — PX: CORONARY STENT INTERVENTION: CATH118234

## 2017-07-02 HISTORY — PX: CORONARY/GRAFT ACUTE MI REVASCULARIZATION: CATH118305

## 2017-07-02 HISTORY — PX: CORONARY ANGIOGRAPHY: CATH118303

## 2017-07-02 LAB — BASIC METABOLIC PANEL
Anion gap: 7 (ref 5–15)
BUN: 19 mg/dL (ref 6–20)
CHLORIDE: 104 mmol/L (ref 101–111)
CO2: 25 mmol/L (ref 22–32)
CREATININE: 1.23 mg/dL (ref 0.61–1.24)
Calcium: 9 mg/dL (ref 8.9–10.3)
GFR, EST NON AFRICAN AMERICAN: 58 mL/min — AB (ref >60)
Glucose, Bld: 101 mg/dL — ABNORMAL HIGH (ref 65–99)
Potassium: 4.6 mmol/L (ref 3.5–5.1)
SODIUM: 136 mmol/L (ref 135–145)

## 2017-07-02 LAB — CBC
HCT: 41.8 % (ref 39.0–52.0)
Hemoglobin: 13.9 g/dL (ref 13.0–17.0)
MCH: 28.8 pg (ref 26.0–34.0)
MCHC: 33.3 g/dL (ref 30.0–36.0)
MCV: 86.7 fL (ref 78.0–100.0)
PLATELETS: 249 10*3/uL (ref 150–400)
RBC: 4.82 MIL/uL (ref 4.22–5.81)
RDW: 13.3 % (ref 11.5–15.5)
WBC: 9 10*3/uL (ref 4.0–10.5)

## 2017-07-02 LAB — TROPONIN I
TROPONIN I: 2.11 ng/mL — AB (ref <0.03)
Troponin I: 3.86 ng/mL (ref <0.03)

## 2017-07-02 LAB — POCT ACTIVATED CLOTTING TIME
Activated Clotting Time: 279 seconds
Activated Clotting Time: 296 seconds
Activated Clotting Time: 334 seconds

## 2017-07-02 LAB — I-STAT TROPONIN, ED: Troponin i, poc: 1.15 ng/mL (ref 0.00–0.08)

## 2017-07-02 SURGERY — CORONARY/GRAFT ACUTE MI REVASCULARIZATION
Anesthesia: LOCAL

## 2017-07-02 SURGERY — LEFT HEART CATH AND CORONARY ANGIOGRAPHY
Anesthesia: LOCAL

## 2017-07-02 MED ORDER — VERAPAMIL HCL 2.5 MG/ML IV SOLN
INTRAVENOUS | Status: AC
  Filled 2017-07-02: qty 2

## 2017-07-02 MED ORDER — SODIUM CHLORIDE 0.9% FLUSH
3.0000 mL | Freq: Two times a day (BID) | INTRAVENOUS | Status: DC
  Administered 2017-07-04: 3 mL via INTRAVENOUS

## 2017-07-02 MED ORDER — ACETAMINOPHEN 325 MG PO TABS
650.0000 mg | ORAL_TABLET | ORAL | Status: DC | PRN
  Administered 2017-07-03: 650 mg via ORAL
  Filled 2017-07-02: qty 2

## 2017-07-02 MED ORDER — OXYCODONE HCL 5 MG PO TABS
40.0000 mg | ORAL_TABLET | ORAL | Status: DC | PRN
  Administered 2017-07-02: 40 mg via ORAL
  Administered 2017-07-03: 60 mg via ORAL
  Administered 2017-07-03: 50 mg via ORAL
  Administered 2017-07-03: 45 mg via ORAL
  Administered 2017-07-03: 40 mg via ORAL
  Administered 2017-07-03 – 2017-07-05 (×6): 60 mg via ORAL
  Filled 2017-07-02: qty 8
  Filled 2017-07-02: qty 12
  Filled 2017-07-02: qty 9
  Filled 2017-07-02 (×3): qty 12
  Filled 2017-07-02: qty 10
  Filled 2017-07-02: qty 12
  Filled 2017-07-02: qty 10
  Filled 2017-07-02: qty 12
  Filled 2017-07-02: qty 8
  Filled 2017-07-02: qty 12

## 2017-07-02 MED ORDER — TIROFIBAN HCL IN NACL 5-0.9 MG/100ML-% IV SOLN
INTRAVENOUS | Status: DC | PRN
  Administered 2017-07-02: 0.075 ug/kg/min via INTRAVENOUS

## 2017-07-02 MED ORDER — FENTANYL CITRATE (PF) 100 MCG/2ML IJ SOLN
INTRAMUSCULAR | Status: DC | PRN
Start: 2017-07-02 — End: 2017-07-02
  Administered 2017-07-02 (×3): 25 ug via INTRAVENOUS

## 2017-07-02 MED ORDER — HEPARIN (PORCINE) IN NACL 100-0.45 UNIT/ML-% IJ SOLN
INTRAMUSCULAR | Status: AC
  Administered 2017-07-02: 20:00:00 4000 [IU] via INTRAVENOUS
  Filled 2017-07-02: qty 250

## 2017-07-02 MED ORDER — MIDAZOLAM HCL 2 MG/2ML IJ SOLN
INTRAMUSCULAR | Status: AC
Start: 2017-07-02 — End: 2017-07-02
  Filled 2017-07-02: qty 2

## 2017-07-02 MED ORDER — HYDRALAZINE HCL 20 MG/ML IJ SOLN: 5.0000 mg | INTRAMUSCULAR | Status: AC | PRN

## 2017-07-02 MED ORDER — NITROGLYCERIN 1 MG/10 ML FOR IR/CATH LAB
INTRA_ARTERIAL | Status: AC
  Filled 2017-07-02: qty 10

## 2017-07-02 MED ORDER — TIROFIBAN HCL IN NACL 5-0.9 MG/100ML-% IV SOLN
0.1500 ug/kg/min | INTRAVENOUS | Status: AC
  Administered 2017-07-03 (×2): 0.15 ug/kg/min via INTRAVENOUS
  Filled 2017-07-02 (×2): qty 100

## 2017-07-02 MED ORDER — HEPARIN BOLUS VIA INFUSION
4000.0000 [IU] | INTRAVENOUS | Status: DC
Start: 2017-07-02 — End: 2017-07-02

## 2017-07-02 MED ORDER — SODIUM CHLORIDE 0.9 % IV SOLN: 250.0000 mL | INTRAVENOUS | Status: DC | PRN

## 2017-07-02 MED ORDER — LIDOCAINE HCL (PF) 1 % IJ SOLN
INTRAMUSCULAR | Status: DC | PRN
  Administered 2017-07-02: 4 mL

## 2017-07-02 MED ORDER — ASPIRIN 300 MG RE SUPP: 300.0000 mg | RECTAL | Status: DC

## 2017-07-02 MED ORDER — SODIUM CHLORIDE 0.9 % WEIGHT BASED INFUSION: 3.0000 mL/kg/h | INTRAVENOUS | Status: DC

## 2017-07-02 MED ORDER — MORPHINE SULFATE (PF) 4 MG/ML IV SOLN
2.0000 mg | INTRAVENOUS | Status: DC | PRN
  Administered 2017-07-02 – 2017-07-03 (×2): 2 mg via INTRAVENOUS
  Filled 2017-07-02: qty 1

## 2017-07-02 MED ORDER — TICAGRELOR 90 MG PO TABS
ORAL_TABLET | ORAL | Status: DC | PRN
  Administered 2017-07-02: 180 mg via ORAL

## 2017-07-02 MED ORDER — ATORVASTATIN CALCIUM 40 MG PO TABS
40.0000 mg | ORAL_TABLET | Freq: Every day | ORAL | Status: DC
  Administered 2017-07-03: 40 mg via ORAL
  Filled 2017-07-02: qty 1

## 2017-07-02 MED ORDER — TICAGRELOR 90 MG PO TABS
ORAL_TABLET | ORAL | Status: AC
  Filled 2017-07-02: qty 1

## 2017-07-02 MED ORDER — HEPARIN BOLUS VIA INFUSION
4000.0000 [IU] | Freq: Once | INTRAVENOUS | Status: AC
  Administered 2017-07-02: 4000 [IU] via INTRAVENOUS
  Filled 2017-07-02: qty 4000

## 2017-07-02 MED ORDER — FOLIC ACID 1 MG PO TABS
1.0000 mg | ORAL_TABLET | Freq: Every day | ORAL | Status: DC
  Administered 2017-07-03 – 2017-07-05 (×3): 1 mg via ORAL
  Filled 2017-07-02 (×3): qty 1

## 2017-07-02 MED ORDER — MORPHINE SULFATE (PF) 4 MG/ML IV SOLN
INTRAVENOUS | Status: AC
  Filled 2017-07-02: qty 1

## 2017-07-02 MED ORDER — FENTANYL CITRATE (PF) 100 MCG/2ML IJ SOLN
INTRAMUSCULAR | Status: AC
  Filled 2017-07-02: qty 2

## 2017-07-02 MED ORDER — HEPARIN SODIUM (PORCINE) 1000 UNIT/ML IJ SOLN
INTRAMUSCULAR | Status: AC
  Filled 2017-07-02: qty 1

## 2017-07-02 MED ORDER — HEPARIN (PORCINE) IN NACL 2-0.9 UNIT/ML-% IJ SOLN
INTRAMUSCULAR | Status: AC
  Filled 2017-07-02: qty 500

## 2017-07-02 MED ORDER — HEPARIN (PORCINE) IN NACL 2-0.9 UNIT/ML-% IJ SOLN
INTRAMUSCULAR | Status: DC | PRN
  Administered 2017-07-02: 10 mL via INTRA_ARTERIAL

## 2017-07-02 MED ORDER — TIROFIBAN (AGGRASTAT) BOLUS VIA INFUSION
INTRAVENOUS | Status: DC | PRN
  Administered 2017-07-02: 2267.5 ug via INTRAVENOUS

## 2017-07-02 MED ORDER — IOPAMIDOL (ISOVUE-370) INJECTION 76%
INTRAVENOUS | Status: AC
  Filled 2017-07-02: qty 50

## 2017-07-02 MED ORDER — MIDAZOLAM HCL 2 MG/2ML IJ SOLN
INTRAMUSCULAR | Status: AC
  Filled 2017-07-02: qty 2

## 2017-07-02 MED ORDER — IOPAMIDOL (ISOVUE-370) INJECTION 76%
INTRAVENOUS | Status: DC | PRN
  Administered 2017-07-02: 240 mL via INTRAVENOUS

## 2017-07-02 MED ORDER — ASPIRIN EC 81 MG PO TBEC
81.0000 mg | DELAYED_RELEASE_TABLET | Freq: Every day | ORAL | Status: DC
Start: 2017-07-03 — End: 2017-07-05
  Administered 2017-07-03 – 2017-07-05 (×3): 81 mg via ORAL
  Filled 2017-07-02 (×3): qty 1

## 2017-07-02 MED ORDER — NITROGLYCERIN IN D5W 200-5 MCG/ML-% IV SOLN: 0.0000 ug/min | INTRAVENOUS | Status: DC

## 2017-07-02 MED ORDER — HEPARIN SODIUM (PORCINE) 1000 UNIT/ML IJ SOLN
INTRAMUSCULAR | Status: DC | PRN
  Administered 2017-07-02: 5000 [IU] via INTRAVENOUS
  Administered 2017-07-02: 2000 [IU] via INTRAVENOUS
  Administered 2017-07-02: 5000 [IU] via INTRAVENOUS

## 2017-07-02 MED ORDER — ONDANSETRON HCL 4 MG/2ML IJ SOLN
4.0000 mg | Freq: Four times a day (QID) | INTRAMUSCULAR | Status: DC | PRN
  Administered 2017-07-02 – 2017-07-04 (×2): 4 mg via INTRAVENOUS
  Filled 2017-07-02 (×2): qty 2

## 2017-07-02 MED ORDER — TIROFIBAN HCL IN NACL 5-0.9 MG/100ML-% IV SOLN
INTRAVENOUS | Status: AC
  Filled 2017-07-02: qty 100

## 2017-07-02 MED ORDER — ALUM & MAG HYDROXIDE-SIMETH 200-200-20 MG/5ML PO SUSP
30.0000 mL | ORAL | Status: DC | PRN
  Administered 2017-07-02: 30 mL via ORAL
  Filled 2017-07-02: qty 30

## 2017-07-02 MED ORDER — IOPAMIDOL (ISOVUE-370) INJECTION 76%
INTRAVENOUS | Status: AC
  Filled 2017-07-02: qty 125

## 2017-07-02 MED ORDER — ONDANSETRON HCL 4 MG/2ML IJ SOLN: 4.0000 mg | Freq: Four times a day (QID) | INTRAMUSCULAR | Status: DC | PRN

## 2017-07-02 MED ORDER — MIDAZOLAM HCL 2 MG/2ML IJ SOLN
INTRAMUSCULAR | Status: DC | PRN
  Administered 2017-07-02 (×2): 1 mg via INTRAVENOUS
  Administered 2017-07-02: 2 mg via INTRAVENOUS

## 2017-07-02 MED ORDER — LABETALOL HCL 5 MG/ML IV SOLN: 10.0000 mg | INTRAVENOUS | Status: AC | PRN

## 2017-07-02 MED ORDER — SODIUM CHLORIDE 0.9 % WEIGHT BASED INFUSION: 1.0000 mL/kg/h | INTRAVENOUS | Status: AC

## 2017-07-02 MED ORDER — HEPARIN (PORCINE) IN NACL 100-0.45 UNIT/ML-% IJ SOLN
1100.0000 [IU]/h | INTRAMUSCULAR | Status: DC
  Administered 2017-07-02: 1100 [IU]/h via INTRAVENOUS
  Filled 2017-07-02: qty 250

## 2017-07-02 MED ORDER — LIDOCAINE HCL (PF) 1 % IJ SOLN
INTRAMUSCULAR | Status: AC
Start: 2017-07-02 — End: 2017-07-02
  Filled 2017-07-02: qty 30

## 2017-07-02 MED ORDER — BUPROPION HCL ER (XL) 150 MG PO TB24
150.0000 mg | ORAL_TABLET | Freq: Every day | ORAL | Status: DC
  Administered 2017-07-03 – 2017-07-05 (×3): 150 mg via ORAL
  Filled 2017-07-02 (×4): qty 1

## 2017-07-02 MED ORDER — LIDOCAINE HCL (PF) 1 % IJ SOLN
INTRAMUSCULAR | Status: AC
  Filled 2017-07-02: qty 30

## 2017-07-02 MED ORDER — PANTOPRAZOLE SODIUM 40 MG PO TBEC
40.0000 mg | DELAYED_RELEASE_TABLET | Freq: Every day | ORAL | Status: DC
  Administered 2017-07-03 – 2017-07-05 (×3): 40 mg via ORAL
  Filled 2017-07-02 (×3): qty 1

## 2017-07-02 MED ORDER — SODIUM CHLORIDE 0.9 % WEIGHT BASED INFUSION: 1.0000 mL/kg/h | INTRAVENOUS | Status: DC

## 2017-07-02 MED ORDER — TICAGRELOR 90 MG PO TABS
90.0000 mg | ORAL_TABLET | Freq: Two times a day (BID) | ORAL | Status: DC
  Administered 2017-07-03 – 2017-07-05 (×6): 90 mg via ORAL
  Filled 2017-07-02 (×6): qty 1

## 2017-07-02 MED ORDER — GABAPENTIN 300 MG PO CAPS
300.0000 mg | ORAL_CAPSULE | Freq: Every day | ORAL | Status: DC
  Administered 2017-07-02 – 2017-07-04 (×3): 300 mg via ORAL
  Filled 2017-07-02 (×3): qty 1

## 2017-07-02 MED ORDER — BIVALIRUDIN TRIFLUOROACETATE 250 MG IV SOLR
INTRAVENOUS | Status: AC
  Filled 2017-07-02: qty 250

## 2017-07-02 MED ORDER — ACETAMINOPHEN 325 MG PO TABS: 650.0000 mg | ORAL_TABLET | ORAL | Status: DC | PRN

## 2017-07-02 MED ORDER — ASPIRIN 81 MG PO CHEW: 324.0000 mg | CHEWABLE_TABLET | ORAL | Status: DC

## 2017-07-02 MED ORDER — MIDAZOLAM HCL 2 MG/2ML IJ SOLN
INTRAMUSCULAR | Status: DC | PRN
  Administered 2017-07-02: 1 mg via INTRAVENOUS

## 2017-07-02 MED ORDER — FENTANYL CITRATE (PF) 100 MCG/2ML IJ SOLN
INTRAMUSCULAR | Status: DC | PRN
  Administered 2017-07-02: 25 ug via INTRAVENOUS

## 2017-07-02 MED ORDER — SODIUM CHLORIDE 0.9% FLUSH
3.0000 mL | Freq: Two times a day (BID) | INTRAVENOUS | Status: DC
  Administered 2017-07-03 – 2017-07-05 (×3): 3 mL via INTRAVENOUS

## 2017-07-02 MED ORDER — IOPAMIDOL (ISOVUE-370) INJECTION 76%
INTRAVENOUS | Status: DC | PRN
  Administered 2017-07-02: 145 mL via INTRA_ARTERIAL

## 2017-07-02 MED ORDER — TIROFIBAN HCL IN NACL 5-0.9 MG/100ML-% IV SOLN
INTRAVENOUS | Status: AC | PRN
  Administered 2017-07-02: 0.15 ug/kg/min via INTRAVENOUS

## 2017-07-02 MED ORDER — SODIUM CHLORIDE 0.9% FLUSH: 3.0000 mL | INTRAVENOUS | Status: DC | PRN

## 2017-07-02 MED ORDER — SODIUM CHLORIDE 0.9 % IV SOLN
INTRAVENOUS | Status: AC | PRN
  Administered 2017-07-02: 100 mL/h via INTRAVENOUS

## 2017-07-02 MED ORDER — RAMIPRIL 5 MG PO CAPS
10.0000 mg | ORAL_CAPSULE | Freq: Every day | ORAL | Status: DC
  Administered 2017-07-03 – 2017-07-05 (×3): 10 mg via ORAL
  Filled 2017-07-02: qty 2
  Filled 2017-07-02 (×2): qty 1

## 2017-07-02 MED ORDER — BIVALIRUDIN BOLUS VIA INFUSION - CUPID
INTRAVENOUS | Status: DC | PRN
  Administered 2017-07-02: 68.025 mg via INTRAVENOUS

## 2017-07-02 MED ORDER — HEPARIN (PORCINE) IN NACL 2-0.9 UNIT/ML-% IJ SOLN
INTRAMUSCULAR | Status: AC | PRN
  Administered 2017-07-02: 1000 mL

## 2017-07-02 MED ORDER — METOPROLOL TARTRATE 5 MG/5ML IV SOLN
INTRAVENOUS | Status: AC
  Administered 2017-07-02: 2.5 mg via INTRAVENOUS
  Filled 2017-07-02: qty 5

## 2017-07-02 MED ORDER — IOPAMIDOL (ISOVUE-370) INJECTION 76%
INTRAVENOUS | Status: AC
  Filled 2017-07-02: qty 100

## 2017-07-02 MED ORDER — LABETALOL HCL 5 MG/ML IV SOLN: 10.0000 mg | INTRAVENOUS | Status: DC | PRN

## 2017-07-02 MED ORDER — HYDRALAZINE HCL 20 MG/ML IJ SOLN: 5.0000 mg | INTRAMUSCULAR | Status: DC | PRN

## 2017-07-02 MED ORDER — TIZANIDINE HCL 2 MG PO TABS
2.0000 mg | ORAL_TABLET | Freq: Four times a day (QID) | ORAL | Status: DC | PRN
  Administered 2017-07-03 (×2): 4 mg via ORAL
  Filled 2017-07-02: qty 2
  Filled 2017-07-02: qty 1
  Filled 2017-07-02 (×3): qty 2

## 2017-07-02 MED ORDER — NITROGLYCERIN 0.4 MG SL SUBL
0.4000 mg | SUBLINGUAL_TABLET | SUBLINGUAL | Status: DC | PRN
  Administered 2017-07-02 (×2): 0.4 mg via SUBLINGUAL
  Filled 2017-07-02 (×2): qty 1

## 2017-07-02 MED ORDER — SODIUM CHLORIDE 0.9 % IV SOLN
INTRAVENOUS | Status: DC | PRN
  Administered 2017-07-02: 1.75 mg/kg/h via INTRAVENOUS

## 2017-07-02 MED ORDER — ASPIRIN 81 MG PO CHEW: 81.0000 mg | CHEWABLE_TABLET | Freq: Every day | ORAL | Status: DC

## 2017-07-02 MED ORDER — METOPROLOL TARTRATE 5 MG/5ML IV SOLN
2.5000 mg | Freq: Once | INTRAVENOUS | Status: AC
  Administered 2017-07-02: 2.5 mg via INTRAVENOUS

## 2017-07-02 MED ORDER — HEPARIN (PORCINE) IN NACL 2-0.9 UNIT/ML-% IJ SOLN
INTRAMUSCULAR | Status: AC
  Filled 2017-07-02: qty 1000

## 2017-07-02 MED ORDER — ESCITALOPRAM OXALATE 10 MG PO TABS
10.0000 mg | ORAL_TABLET | Freq: Every day | ORAL | Status: DC
  Administered 2017-07-03 – 2017-07-05 (×3): 10 mg via ORAL
  Filled 2017-07-02 (×5): qty 1

## 2017-07-02 MED ORDER — NITROGLYCERIN 1 MG/10 ML FOR IR/CATH LAB
INTRA_ARTERIAL | Status: DC | PRN
  Administered 2017-07-02: 200 ug via INTRACORONARY

## 2017-07-02 MED ORDER — HEPARIN (PORCINE) IN NACL 2-0.9 UNIT/ML-% IJ SOLN
INTRAMUSCULAR | Status: AC | PRN
  Administered 2017-07-02: 1500 mL

## 2017-07-02 MED ORDER — HEPARIN (PORCINE) IN NACL 100-0.45 UNIT/ML-% IJ SOLN: 1100.0000 [IU]/h | INTRAMUSCULAR | Status: DC

## 2017-07-02 MED ORDER — LIDOCAINE HCL (PF) 1 % IJ SOLN
INTRAMUSCULAR | Status: DC | PRN
  Administered 2017-07-02: 10 mL

## 2017-07-02 MED ORDER — NITROGLYCERIN IN D5W 200-5 MCG/ML-% IV SOLN
INTRAVENOUS | Status: AC
  Filled 2017-07-02: qty 250

## 2017-07-02 MED ORDER — AMLODIPINE BESYLATE 5 MG PO TABS
5.0000 mg | ORAL_TABLET | Freq: Every day | ORAL | Status: DC
  Administered 2017-07-03 – 2017-07-04 (×2): 5 mg via ORAL
  Filled 2017-07-02 (×2): qty 1

## 2017-07-02 MED ORDER — HEPARIN SODIUM (PORCINE) 1000 UNIT/ML IJ SOLN
4000.0000 [IU] | Freq: Once | INTRAMUSCULAR | Status: DC
  Filled 2017-07-02 (×2): qty 4

## 2017-07-02 MED ORDER — SODIUM CHLORIDE 0.9 % IV SOLN: INTRAVENOUS | Status: AC

## 2017-07-02 MED ORDER — ASPIRIN 81 MG PO CHEW
324.0000 mg | CHEWABLE_TABLET | Freq: Once | ORAL | Status: AC
  Administered 2017-07-02: 324 mg via ORAL
  Filled 2017-07-02: qty 4

## 2017-07-02 SURGICAL SUPPLY — 24 items
BALLN SAPPHIRE 2.0X15 (BALLOONS) ×2
BALLN SAPPHIRE 2.0X20 (BALLOONS) ×2
BALLN ~~LOC~~ EMERGE MR 2.5X20 (BALLOONS) ×2
BALLN ~~LOC~~ EUPHORA RX 2.75X15 (BALLOONS) ×2
BALLOON SAPPHIRE 2.0X15 (BALLOONS) IMPLANT
BALLOON SAPPHIRE 2.0X20 (BALLOONS) IMPLANT
BALLOON ~~LOC~~ EMERGE MR 2.5X20 (BALLOONS) IMPLANT
BALLOON ~~LOC~~ EUPHORA RX 2.75X15 (BALLOONS) IMPLANT
CATH VISTA GUIDE 6FR XBLAD4 (CATHETERS) ×1 IMPLANT
DEVICE WIRE ANGIOSEAL 6FR (Vascular Products) ×1 IMPLANT
ELECT DEFIB PAD ADLT CADENCE (PAD) ×1 IMPLANT
GLIDESHEATH SLEND SS 6F .021 (SHEATH) ×1 IMPLANT
GUIDEWIRE INQWIRE 1.5J.035X260 (WIRE) IMPLANT
INQWIRE 1.5J .035X260CM (WIRE) ×2
KIT ENCORE 26 ADVANTAGE (KITS) ×1 IMPLANT
KIT HEART LEFT (KITS) ×2 IMPLANT
PACK CARDIAC CATHETERIZATION (CUSTOM PROCEDURE TRAY) ×2 IMPLANT
SHEATH PINNACLE 6F 10CM (SHEATH) ×1 IMPLANT
STENT RESOLUTE ONYX 2.25X38 (Permanent Stent) ×1 IMPLANT
STENT RESOLUTE ONYX 2.5X18 (Permanent Stent) ×1 IMPLANT
TRANSDUCER W/STOPCOCK (MISCELLANEOUS) ×2 IMPLANT
TUBING CIL FLEX 10 FLL-RA (TUBING) ×2 IMPLANT
WIRE COUGAR XT STRL 190CM (WIRE) ×1 IMPLANT
WIRE EMERALD 3MM-J .035X150CM (WIRE) ×1 IMPLANT

## 2017-07-02 SURGICAL SUPPLY — 18 items
BALLN SAPPHIRE 2.5X15 (BALLOONS) ×2
BALLN SAPPHIRE ~~LOC~~ 3.25X18 (BALLOONS) ×1 IMPLANT
BALLOON SAPPHIRE 2.5X15 (BALLOONS) IMPLANT
CATH 5FR JL3.5 JR4 ANG PIG MP (CATHETERS) ×1 IMPLANT
CATH VISTA GUIDE 6FR XBLAD3.5 (CATHETERS) ×1 IMPLANT
DEVICE RAD COMP TR BAND LRG (VASCULAR PRODUCTS) ×1 IMPLANT
GLIDESHEATH SLEND SS 6F .021 (SHEATH) ×1 IMPLANT
GUIDEWIRE INQWIRE 1.5J.035X260 (WIRE) IMPLANT
INQWIRE 1.5J .035X260CM (WIRE) ×2
KIT ENCORE 26 ADVANTAGE (KITS) ×1 IMPLANT
KIT HEART LEFT (KITS) ×2 IMPLANT
PACK CARDIAC CATHETERIZATION (CUSTOM PROCEDURE TRAY) ×2 IMPLANT
STENT RESOLUTE ONYX 3.0X8 (Permanent Stent) ×1 IMPLANT
STENT RESOLUTE ONYX3.0X38 (Permanent Stent) ×1 IMPLANT
SYR MEDRAD MARK V 150ML (SYRINGE) ×2 IMPLANT
TRANSDUCER W/STOPCOCK (MISCELLANEOUS) ×2 IMPLANT
TUBING CIL FLEX 10 FLL-RA (TUBING) ×2 IMPLANT
WIRE COUGAR XT STRL 190CM (WIRE) ×2 IMPLANT

## 2017-07-02 NOTE — Progress Notes (Signed)
Patient in SVT with HR 150's. EKG completed. Patient complains of 5/10 chest pressure.Dr. Charlestine NightMeans paged. Valsalva attempted without change.

## 2017-07-02 NOTE — Progress Notes (Addendum)
Called to see pt re: CP Pt developed CP at approx 6 pm. He thought it was indigestion, and did not tell anyone.  He was able to eat 3 or 4 bites of bread and a few sips of liquid.  However, the chest pain became worse and got up to a 9/10.  He states he has never had pain this bad before.  It is midsternal, just to the left of the sternum.  It is much worse than the pain that he had prior to his procedure. When the pain became severe, it was also associated with shortness of breath, nausea, and some diaphoresis.  The nursing staff was made aware of the pain, and gave him Maalox, Zofran 4 mg and sublingual nitroglycerin x2. His pain is currently a 5 or 6/10.  His ECG was performed and is significantly abnormal, reading STEMI.  Dr. Mayford Knifeurner is aware, is contacting Dr. Clifton JamesMcAlhany who is the STEMI cardiologist and is activating code STEMI.  IV nitroglycerin and morphine are both ordered.  Dr. Pearletha AlfredMcAhany also requests heparin and this is ordered.  Theodore DemarkRhonda Barrett, PA-C 07/02/2017 7:40 PM Beeper [phone]

## 2017-07-02 NOTE — ED Triage Notes (Signed)
Pt endorses chest pain with shob and dizziness worse with movement x 4 days. Pt has hx of MI with stent placement and 2 caths. VSS.

## 2017-07-02 NOTE — Progress Notes (Signed)
Pt continues to have midsternal chest pain. Rhonda Barrett at bedside.  NTG drip hung and infusing at 20 mcgs. Morphine 2mg  administered IV for CP 6/10. TR Band remains inflated at 13 cc's. Coban pressure dressing intact .Pt denies SOB Rapid Respose called 1955 4000 Unit bolus of Heparin administered as ordered. 2000 Consent obtained for Cardiac Catheterization Dr Mayford Knifeurner in to see and evaluate pt. Code STEMI initiated. 2007 Dr Zola ButtonMc Alhany at bedside to talk to pt and family . Pt transferred to Cath Lab via bed on monitor and Nasal O2 at 2L/min. Family taken to 2H to await procedure completion as per Dr Erlinda HongMc Alhany's request. Family has pt's belongings.

## 2017-07-02 NOTE — Interval H&P Note (Signed)
Cath Lab Visit (complete for each Cath Lab visit)  Clinical Evaluation Leading to the Procedure:   ACS: Yes.    Non-ACS:    Anginal Classification: CCS IV  Anti-ischemic medical therapy: No Therapy  Non-Invasive Test Results: No non-invasive testing performed  Prior CABG: No previous CABG      History and Physical Interval Note:  07/02/2017 2:57 PM  Clayton Anderson  has presented today for surgery, with the diagnosis of cp  The various methods of treatment have been discussed with the patient and family. After consideration of risks, benefits and other options for treatment, the patient has consented to  Procedure(s): LEFT HEART CATH AND CORONARY ANGIOGRAPHY (N/A) as a surgical intervention .  The patient's history has been reviewed, patient examined, no change in status, stable for surgery.  I have reviewed the patient's chart and labs.  Questions were answered to the patient's satisfaction.     Audelia HivesMichale Daylan Boggess

## 2017-07-02 NOTE — Progress Notes (Signed)
ANTICOAGULATION CONSULT NOTE - Initial Consult  Pharmacy Consult for heparin Indication: chest pain/ACS  Allergies  Allergen Reactions  . Sulfa Antibiotics Hives and Other (See Comments)  . Tetanus Toxoids Swelling and Other (See Comments)    Patient Measurements: Height: 5\' 9"  (175.3 cm) Weight: 200 lb (90.7 kg) IBW/kg (Calculated) : 70.7 Heparin Dosing Weight: 89kg  Vital Signs: Temp: 98 F (36.7 C) (11/23 1250) Temp Source: Oral (11/23 1250) BP: 140/74 (11/23 1400) Pulse Rate: 68 (11/23 1400)  Labs: Recent Labs    07/02/17 1304  HGB 13.9  HCT 41.8  PLT 249  CREATININE 1.23    Estimated Creatinine Clearance: 62.2 mL/min (by C-G formula based on SCr of 1.23 mg/dL).   Medical History: Past Medical History:  Diagnosis Date  . Anxiety   . Arthritis   . Baker's cyst of knee    left  . Cervical disc disease 07/20/2011  . Complication of anesthesia    " I wake up in a combative mode"  . Coronary artery disease   . Depression   . Elevated cholesterol   . GERD (gastroesophageal reflux disease)   . Headache(784.0)    years ago  . History of hiatal hernia   . Hypertension   . Myocardial infarct [facility](HCC)    2001  sees Dr. York SpanielS Tilley  . Neuromuscular disorder (HCC)    hx of shingles/ myofacitis  . Shortness of breath     Medications:  Infusions:  . heparin      Assessment: 71 yom presented to the ED with CP, SOB and dizziness. Troponin elevated and now starting IV heparin. Baseline H/H and platelets are WNL. He is not on anticoagulation PTA.   Goal of Therapy:  Heparin level 0.3-0.7 units/ml Monitor platelets by anticoagulation protocol: Yes   Plan:  Heparin bolus 4000 units IV x 1 Heparin gtt 1100 units/hr Check an 8 hr heparin level Daily heparin level and CBC  Kayce Betty, Drake Leachachel Lynn 07/02/2017,2:19 PM

## 2017-07-02 NOTE — ED Provider Notes (Signed)
[facility] EMERGENCY DEPARTMENT Provider Note   CSN: [id number] Arrival date & time: 07/02/17  1239     History   Chief Complaint Chief Complaint  Patient presents with  . Chest Pain  . Shortness of Breath  . Dizziness    HPI Clayton Anderson is a 71 y.o. male.  HPI This patient with a history of coronary disease presents with concern of chest pain, minimal at rest, worse with exertion.  Patient was well until about 1 week ago. Since that time he has had mild anterior chest discomfort at rest, but noticeable increase with exertion. There is associated to dyspnea on exertion which is all new. No syncope, though he has been lightheaded, and onto the ground twice. After the initial evaluation, when I discussed his recent medical history, the patient notes that he stopped taking aspirin due to recently incurred an ankle sprain.  He is taking Naprosyn daily. Patient's history includes stent, almost 2 decades ago. He also has had multiple cervical spine procedures. However, the patient states that he is generally well, functional.   Past Medical History:  Diagnosis Date  . Anxiety   . Arthritis   . Baker's cyst of knee    left  . Cervical disc disease 07/20/2011  . Complication of anesthesia    " I wake up in a combative mode"  . Coronary artery disease   . Depression   . Elevated cholesterol   . GERD (gastroesophageal reflux disease)   . Headache(784.0)    years ago  . History of hiatal hernia   . Hypertension   . Myocardial infarct [facility])    2001  sees Dr. York Spaniel  . Neuromuscular disorder (HCC)    hx of shingles/ myofacitis  . Shortness of breath     Patient Active Problem List   Diagnosis Date Noted  . Status post cervical spinal fusion 10/24/2015  . DDD (degenerative disc disease), cervical 10/24/2015  . Cervical facet syndrome 10/24/2015  . Bilateral occipital neuralgia 10/24/2015  . S/P shoulder surgery 10/24/2015  . Dyspnea 08/17/2011    . Pulmonary nodule 08/17/2011  . CAD (coronary artery disease)   . GERD (gastroesophageal reflux disease)   . Hypertension 07/20/2011  . Hyperlipidemia 07/20/2011  . Cervical disc disease     Past Surgical History:  Procedure Laterality Date  . COLONOSCOPY    . CORONARY ANGIOPLASTY     x 2  last one 07/2011  . CORONARY STENT PLACEMENT  2001   Tetra Multi Link Stent-3T MRI compatible, normal mode  . LAMINECTOMY AND MICRODISCECTOMY CERVICAL SPINE     January and August 2010  . LEFT HEART CATHETERIZATION WITH CORONARY ANGIOGRAM N/A 07/20/2011   Procedure: LEFT HEART CATHETERIZATION WITH CORONARY ANGIOGRAM;  Surgeon: Othella Boyer, MD;  Location: [facility] CATH LAB;  Service: Cardiovascular;  Laterality: N/A;  . SHOULDER ARTHROSCOPY WITH LABRAL REPAIR Left 12/01/2012   Procedure: LEFT SHOULDER ARTHROSCOPY WITH ASPIRATION OF DEGENERATIVE OF THE PARALABRAL CYST and OPEN DECOMPRESSION OF SUPRASCAPULAR NERVE;  Surgeon: Senaida Lange, MD;  Location: [facility] OR;  Service: Orthopedics;  Laterality: Left;  . SHOULDER SURGERY     twice  left shoulder       Home Medications    Prior to Admission medications   Medication Sig Start Date End Date Taking? Authorizing Provider  amLODipine (NORVASC) 5 MG tablet Take 5 mg by mouth daily.      [provider]  amLODipine (NORVASC) 5 MG tablet Take  by mouth. 04/16/15   [provider]  amoxicillin (AMOXIL) 875 MG tablet Take 875 mg by mouth 2 (two) times daily. Reported on 02/25/2016    [provider]  amoxicillin-clavulanate (AUGMENTIN) 875-125 MG tablet  01/07/16   [provider]  aspirin EC 81 MG tablet Take 81 mg by mouth daily.      [provider]  atorvastatin (LIPITOR) 40 MG tablet Take 40 mg by mouth daily.    [provider]  cefUROXime (CEFTIN) 250 MG tablet Take 1 tablet (250 mg total) by mouth 2 (two) times daily with a meal. Patient not taking: Reported on 03/26/2016 12/02/15   Ewing Schlein,  MD  cefUROXime (CEFTIN) 250 MG tablet Take 1 tablet (250 mg total) by mouth 2 (two) times daily with a meal. Patient not taking: Reported on 02/25/2016 02/03/16   Ewing Schlein, MD  cefUROXime (CEFTIN) 250 MG tablet Take 1 tablet (250 mg total) by mouth 2 (two) times daily with a meal. Patient not taking: Reported on 03/16/2016 03/16/16   Ewing Schlein, MD  chlorzoxazone (PARAFON) 500 MG tablet Take 750 mg by mouth daily.    [provider]  diazepam (VALIUM) 5 MG tablet Take 0.5-1 tablets (2.5-5 mg total) by mouth every 6 (six) hours as needed for muscle spasms or sedation. Patient not taking: Reported on 02/03/2016 06/14/14   Shuford, French Ana, PA-C  DULoxetine (CYMBALTA) 60 MG capsule Take 60 mg by mouth daily.    [provider]  escitalopram (LEXAPRO) 10 MG tablet Take 10 mg by mouth daily. Reported on 02/03/2016    [provider]  escitalopram (LEXAPRO) 20 MG tablet Take 10 mg by mouth daily. Reported on 02/25/2016    [provider]  esomeprazole (NEXIUM) 40 MG capsule Take 40 mg by mouth daily before breakfast.    [provider]  fentaNYL (DURAGESIC - DOSED MCG/HR) 100 MCG/HR  06/01/14   [provider]  folic acid (FOLVITE) 800 MCG tablet Take 800 mcg by mouth daily. Reported on 02/03/2016    [provider]  gabapentin (NEURONTIN) 300 MG capsule Take 300 mg by mouth daily.    [provider]  HYDROmorphone (DILAUDID) 2 MG tablet Take 1-2 tablets (2-4 mg total) by mouth every 4 (four) hours as needed for severe pain. Patient not taking: Reported on 02/03/2016 06/14/14   Shuford, French Ana, PA-C  ibuprofen (ADVIL,MOTRIN) 200 MG tablet Take 600 mg by mouth daily as needed for moderate pain. Reported on 02/03/2016    [provider]  naloxegol oxalate (MOVANTIK) 25 MG TABS tablet Take by mouth.    [provider]  nitroGLYCERIN (NITROSTAT) 0.4 MG SL tablet Place 0.4 mg under the tongue every 5 (five) minutes as needed.       [provider]  OPANA ER, CRUSH RESISTANT, 40 MG T12A  02/25/16   [provider]  Oxycodone HCl 10 MG TABS Limit 6 - 10  tabs by mouth per day for breakthrough pain while taking Opana ER if tolerated 03/26/16   Ewing Schlein, MD  oxymorphone (OPANA ER) 40 MG 12 hr tablet Limit 1 tablet by mouth every 8 - 12 hours if tolerated 03/26/16   Ewing Schlein, MD  predniSONE (DELTASONE) 20 MG tablet  01/07/16   [provider]  ramipril (ALTACE) 10 MG capsule Take 10 mg by mouth daily.      [provider]  tiotropium (SPIRIVA) 18 MCG inhalation capsule Place 1 capsule (18 mcg total) into inhaler  and inhale daily. Patient not taking: Reported on 03/16/2016 05/19/12   Clance, Maree KrabbeKeith M, MD    Family History Family History  Problem Relation Age of Onset  . Alcohol abuse Father   . Hyperlipidemia Father   . Birth defects Brother   . Diabetes Maternal Grandmother     Social History Social History   Tobacco Use  . Smoking status: Former Smoker    Packs/day: 0.90    Years: 40.00    Pack years: 36.00    Types: Cigarettes    Last attempt to quit: 07/19/2004    Years since quitting: 12.9  . Smokeless tobacco: Never Used  Substance Use Topics  . Alcohol use: Yes    Alcohol/week: 0.0 oz    Comment: occasionally  . Drug use: No     Allergies   Sulfa antibiotics and Tetanus toxoids   Review of Systems Review of Systems  Constitutional:       Per HPI, otherwise negative  HENT:       Per HPI, otherwise negative  Respiratory:       Per HPI, otherwise negative  Cardiovascular:       Per HPI, otherwise negative  Gastrointestinal: Negative for vomiting.  Endocrine:       Negative aside from HPI  Genitourinary:       Neg aside from HPI   Musculoskeletal:       Per HPI, otherwise negative  Skin: Negative.   Neurological: Negative for syncope.     Physical Exam Updated Vital Signs BP (!) 152/72   Pulse 64   Temp 98 F (36.7 C) (Oral)   Resp  14   Ht 5\' 9"  (1.753 m)   Wt 90.7 kg (200 lb)   SpO2 98%   BMI 29.53 kg/m   Physical Exam  Constitutional: He is oriented to person, place, and time. He appears well-developed. No distress.  HENT:  Head: Normocephalic and atraumatic.  Eyes: Conjunctivae and EOM are normal.  Neck:  Left midline surgical scar unremarkable  Cardiovascular: Normal rate and regular rhythm.  Pulmonary/Chest: Effort normal. No stridor. No respiratory distress.  Abdominal: He exhibits no distension.  Musculoskeletal: He exhibits no edema.  Neurological: He is alert and oriented to person, place, and time.  Skin: Skin is warm and dry.  Psychiatric: He has a normal mood and affect.  Nursing note and vitals reviewed.    ED Treatments / Results  Labs (all labs ordered are listed, but only abnormal results are displayed) Labs Reviewed  BASIC METABOLIC PANEL - Abnormal; Notable for the following components:      Result Value   Glucose, Bld 101 (*)    GFR calc non Af Amer 58 (*)    All other components within normal limits  I-STAT TROPONIN, ED - Abnormal; Notable for the following components:   Troponin i, poc 1.15 (*)    All other components within normal limits  CBC  HEPARIN LEVEL (UNFRACTIONATED)    EKG  EKG Interpretation  Date/Time:  Friday July 02 2017 12:49:12 EST Ventricular Rate:  67 PR Interval:  148 QRS Duration: 88 QT Interval:  418 QTC Calculation: 441 R Axis:   56 Text Interpretation:  Normal sinus rhythm Nonspecific T wave abnormality Abnormal ekg Confirmed by Gerhard MunchLockwood, Rickell Wiehe [phone]([id number]) on 07/02/2017 1:58:46 PM       Radiology Dg Chest 2 View  Result Date: 07/02/2017 CLINICAL DATA:  71 year old male with history of chest pain, shortness of breath and dizziness worsening over  the past 4 days. EXAM: CHEST  2 VIEW COMPARISON:  Chest x-ray 07/20/2011.  Chest CT 11/17/2013. FINDINGS: Lung volumes are normal. No consolidative airspace disease. No pleural effusions. No  pneumothorax. No pulmonary nodule or mass noted. Pulmonary vasculature and the cardiomediastinal silhouette are within normal limits. Atherosclerosis in the thoracic aorta. Orthopedic fixation hardware incompletely visualized in the lower cervical spine. IMPRESSION: 1.  No radiographic evidence of acute cardiopulmonary disease. 2. Aortic atherosclerosis. Electronically Signed   By: Trudie Reedaniel  Entrikin M.D.   On: 07/02/2017 13:38    Procedures Procedures (including critical care time)  Medications Ordered in ED Medications  heparin ADULT infusion 100 units/mL (25000 units/25050mL sodium chloride 0.45%) (1,100 Units/hr Intravenous New Bag/Given 07/02/17 1419)  0.9% sodium chloride infusion (not administered)    Followed by  0.9% sodium chloride infusion (not administered)  aspirin chewable tablet 324 mg (324 mg Oral Given 07/02/17 1414)  heparin bolus via infusion 4,000 Units (4,000 Units Intravenous Bolus from Bag 07/02/17 1419)     Initial Impression / Assessment and Plan / ED Course  I have reviewed the triage vital signs and the nursing notes.  Pertinent labs & imaging results that were available during my care of the patient were reviewed by me and considered in my medical decision making (see chart for details).    After initial evaluation, I discussed patient's case with our cardiology colleagues. With elevated troponin, and new dyspnea on exertion, as well as the patient's acknowledged inconsistent compliance with aspirin, there is suspicion for NST EMI, and the patient will be taken to the catheterization lab today for further evaluation.  On repeat exam the patient remains in similar condition, he and his wife are aware of all findings.  Aspirin provided. Heparin provided as well. Patient and written to the catheterization lab.  Final Clinical Impressions(s) / ED Diagnoses  Unstable angina  CRITICAL CARE Performed by: Gerhard Munchobert Dmarion Perfect Total critical care time: 40  minutes Critical care time was exclusive of separately billable procedures and treating other patients. Critical care was necessary to treat or prevent imminent or life-threatening deterioration. Critical care was time spent personally by me on the following activities: development of treatment plan with patient and/or surrogate as well as nursing, discussions with consultants, evaluation of patient's response to treatment, examination of patient, obtaining history from patient or surrogate, ordering and performing treatments and interventions, ordering and review of laboratory studies, ordering and review of radiographic studies, pulse oximetry and re-evaluation of patient's condition.    Gerhard MunchLockwood, Evadene Wardrip, MD 07/02/17 1450

## 2017-07-02 NOTE — Progress Notes (Signed)
Patient C/O acute onset of sub-sternal burning pain.  Maalox, zofran given followed by SL ntg and O2 per cannula.  EKG done and showed STEMI.  MD text paged and an additional SL NTG given. Patient states that pain is improving some. Troponin of 2.11 reported via text page.

## 2017-07-02 NOTE — H&P (Signed)
Cardiology Admission History and Physical:   Patient ID: Clayton Anderson; MRN: [id number]; DOB: 01/14/1946   Admission date: 07/02/2017  Primary Care Provider: Kandyce RudBabaoff, Marcus, MD Primary Cardiologist: Dr. Donnie Ahoilley Primary Electrophysiologist:  N/A  Chief Complaint:  Chest pain  Patient Profile:   Clayton Anderson is a 71 y.o. male with a history of HTN, HLD, RLS and CAD presented with 4 days onset of increase DOE and chest pain reminiscent of previous angina.  History of Present Illness:   Clayton Anderson is a pleasant male with PMH of HTN, HLD, RLS and CAD. He had a cardiac catheterization on 01/06/2000 and underwent successful stenting and percutaneous transluminal coronary angioplasty of LAD, he also underwent angioplasty of left circumflex artery. Last cardiac catheterization was performed by Dr. Donnie Ahoilley on 07/20/2011 which showed widely patent proximal LAD stent, patent PTCA site of left circumflex, normal LVEDP. He presents today with complain of 40 onset of increasing dyspnea with exertion and substernal chest pain. Although the previous chest pain in the past did radiate, his recent chest pain did not radiate to the jaw. It occurs 10-20 minutes each time. He did not seek medical attention yesterday because it was Thanksgiving and he had family members over. He had another episode of chest pain around 6:30 AM this morning, this lasted roughly 30 minutes before improving. At this time, he says he can barely feel a little tightness in the area. After [facility], initial point-of-care troponin was elevated at 1.15. EKG showed no significant ST-T wave changes. A chest x-ray was negative for acute process.  He had 2-3 teaspoons of food around 10 AM this morning, otherwise his stomach is largely empty. I have discussed with the patient benefit and risk of cardiac catheterization, he is agreeable to proceed.   Past Medical History:  Diagnosis Date  . Anxiety   . Arthritis   . Baker's  cyst of knee    left  . Cervical disc disease 07/20/2011  . Complication of anesthesia    " I wake up in a combative mode"  . Coronary artery disease   . Depression   . Elevated cholesterol   . GERD (gastroesophageal reflux disease)   . Headache(784.0)    years ago  . History of hiatal hernia   . Hypertension   . Myocardial infarct [facility](HCC)    2001  sees Dr. York SpanielS Tilley  . Neuromuscular disorder (HCC)    hx of shingles/ myofacitis  . Shortness of breath     Past Surgical History:  Procedure Laterality Date  . COLONOSCOPY    . CORONARY ANGIOPLASTY     x 2  last one 07/2011  . CORONARY STENT PLACEMENT  2001   Tetra Multi Link Stent-3T MRI compatible, normal mode  . LAMINECTOMY AND MICRODISCECTOMY CERVICAL SPINE     January and August 2010  . LEFT HEART CATHETERIZATION WITH CORONARY ANGIOGRAM N/A 07/20/2011   Procedure: LEFT HEART CATHETERIZATION WITH CORONARY ANGIOGRAM;  Surgeon: Othella BoyerWilliam S Tilley, MD;  [facility];  Service: Cardiovascular;  Laterality: N/A;  . SHOULDER ARTHROSCOPY WITH LABRAL REPAIR Left 12/01/2012   Procedure: LEFT SHOULDER ARTHROSCOPY WITH ASPIRATION OF DEGENERATIVE OF THE PARALABRAL CYST and OPEN DECOMPRESSION OF SUPRASCAPULAR NERVE;  Surgeon: Senaida LangeKevin M Supple, MD;  Location: [facility] OR;  Service: Orthopedics;  Laterality: Left;  . SHOULDER SURGERY     twice  left shoulder     Medications Prior to Admission: Prior to Admission medications   Medication  Sig Start Date End Date Taking? Authorizing Provider  amLODipine (NORVASC) 5 MG tablet Take 5 mg by mouth daily.      [provider]  aspirin EC 81 MG tablet Take 81 mg by mouth daily.      [provider]  atorvastatin (LIPITOR) 40 MG tablet Take 40 mg by mouth daily.    [provider]  chlorzoxazone (PARAFON) 500 MG tablet Take 750 mg by mouth daily.    [provider]  diazepam (VALIUM) 5 MG tablet Take 0.5-1 tablets (2.5-5 mg total) by mouth every 6 (six) hours as  needed for muscle spasms or sedation. Patient not taking: Reported on 02/03/2016 06/14/14   Shuford, French Ana, PA-C  DULoxetine (CYMBALTA) 60 MG capsule Take 60 mg by mouth daily.    [provider]  escitalopram (LEXAPRO) 10 MG tablet Take 10 mg by mouth daily. Reported on 02/03/2016    [provider]  escitalopram (LEXAPRO) 20 MG tablet Take 10 mg by mouth daily. Reported on 02/25/2016    [provider]  esomeprazole (NEXIUM) 40 MG capsule Take 40 mg by mouth daily before breakfast.    [provider]  fentaNYL (DURAGESIC - DOSED MCG/HR) 100 MCG/HR  06/01/14   [provider]  folic acid (FOLVITE) 800 MCG tablet Take 800 mcg by mouth daily. Reported on 02/03/2016    [provider]  gabapentin (NEURONTIN) 300 MG capsule Take 300 mg by mouth daily.    [provider]  ibuprofen (ADVIL,MOTRIN) 200 MG tablet Take 600 mg by mouth daily as needed for moderate pain. Reported on 02/03/2016    [provider]  naloxegol oxalate (MOVANTIK) 25 MG TABS tablet Take by mouth.    [provider]  nitroGLYCERIN (NITROSTAT) 0.4 MG SL tablet Place 0.4 mg under the tongue every 5 (five) minutes as needed.      [provider]  OPANA ER, CRUSH RESISTANT, 40 MG T12A  02/25/16   [provider]  Oxycodone HCl 10 MG TABS Limit 6 - 10  tabs by mouth per day for breakthrough pain while taking Opana ER if tolerated 03/26/16   Ewing Schlein, MD  oxymorphone (OPANA ER) 40 MG 12 hr tablet Limit 1 tablet by mouth every 8 - 12 hours if tolerated 03/26/16   Ewing Schlein, MD  predniSONE (DELTASONE) 20 MG tablet  01/07/16   [provider]  ramipril (ALTACE) 10 MG capsule Take 10 mg by mouth daily.      [provider]  tiotropium (SPIRIVA) 18 MCG inhalation capsule Place 1 capsule (18 mcg total) into inhaler and inhale daily. Patient not taking: Reported on 03/16/2016 05/19/12   Barbaraann Share, MD     Allergies:      Allergies  Allergen Reactions  . Sulfa Antibiotics Hives and Other (See Comments)  . Tetanus Toxoids Swelling and Other (See Comments)    Social History:   Social History   Socioeconomic History  . Marital status: Married    Spouse name: Sumit Branham  . Number of children: Y  . Years of education: Not on file  . Highest education level: Not on file  Social Needs  . Financial resource strain: Not on file  . Food insecurity - worry: Not on file  . Food insecurity - inability: Not on file  . Transportation needs - medical: Not on file  . Transportation needs - non-medical: Not on file  Occupational History  . Occupation: retired Curator  Tobacco  Use  . Smoking status: Former Smoker    Packs/day: 0.90    Years: 40.00    Pack years: 36.00    Types: Cigarettes    Last attempt to quit: 07/19/2004    Years since quitting: 12.9  . Smokeless tobacco: Never Used  Substance and Sexual Activity  . Alcohol use: Yes    Alcohol/week: 0.0 oz    Comment: occasionally  . Drug use: No  . Sexual activity: No  Other Topics Concern  . Not on file  Social History Narrative  . Not on file    Family History:   The patient's family history includes Alcohol abuse in his father; Birth defects in his brother; Diabetes in his maternal grandmother; Hyperlipidemia in his father.    ROS:  Please see the history of present illness.  All other ROS reviewed and negative.     Physical Exam/Data:   Vitals:   07/02/17 1250 07/02/17 1346 07/02/17 1400  BP: (!) 175/73 (!) 152/72 140/74  Pulse: 66 64 68  Resp: 18 14 18   Temp: 98 F (36.7 C)    TempSrc: Oral    SpO2: 100% 98% 95%  Weight: 200 lb (90.7 kg)    Height: 5\' 9"  (1.753 m)     No intake or output data in the 24 hours ending 07/02/17 1416 Filed Weights   07/02/17 1250  Weight: 200 lb (90.7 kg)   Body mass index is 29.53 kg/m.  General:  Well nourished, well developed, in no acute distress HEENT: normal Lymph: no  adenopathy Neck: no JVD Endocrine:  No thryomegaly Vascular: No carotid bruits; FA pulses 2+ bilaterally without bruits  Cardiac:  normal S1, S2; RRR; no murmur  Lungs:  clear to auscultation bilaterally, no wheezing, rhonchi or rales  Abd: soft, nontender, no hepatomegaly  Ext: no edema Musculoskeletal:  No deformities, BUE and BLE strength normal and equal Skin: warm and dry  Neuro:  CNs 2-12 intact, no focal abnormalities noted Psych:  Normal affect    EKG:  The ECG that was done  was personally reviewed and demonstrates normal sinus rhythm without significant ST-T wave changes  Relevant CV Studies:  Cath 01/06/2000 LEFT VENTRICULOGRAM:  The left ventriculogram performed in the 30 degree RAO projection.  The aortic valve was normal.  The mitral valve was normal.  The left ventricle was normal in size.  There ejection fraction was estimated at 65-70%.  Coronary arteries arise and distribute normally.  There is calcification involving the ostium of the right coronary artery.  Left main:  The left main coronary artery appears normal.  Left anterior descending:  The left anterior descending has an eccentric mildly segmental stenosis prior to the bifurcation of a large diagonal branch. Mild distal disease is present.  Circumflex:  The circumflex contains two marginal branches.  There is a severe 90% stenosis in the midportion prior to the second marginal.  The right coronary artery ostium is calcified but the vessel with widely patent with a large posterior descending and posterolateral branch.  Postdilatation angiograms of the left anterior descending reveal 0% residual stenosis with no dissection and plane and preservation of all side branches.  Postdilatation angiograms of the circumflex reveal less than 20% stenosis with an excellent angiographic result.  IMPRESSION: 1. Successful stenting and percutaneous transluminal coronary angioplasty of    the left anterior  descending stenosis going from 99% to 0%. 2. Successful percutaneous transluminal coronary angioplasty of the circumflex    marginal branch going  from 90% to less than 20%.   Cath 07/20/2011 LEFT VENTRICULOGRAM:  Performed in the 30 RAO projection.  The aortic and mitral valves are normal. The left ventricle is normal in size with normal wall motion. Estimated ejection fraction is 60%.  IMPRESSIONS:  1. Widely patent stent site in the proximal LAD and patent PTCA site of the circumflex without interval obstructive coronary artery disease noted 2. Normal left ventricular function with normal LVEDP    Echo 07/21/2011 LV EF: 55% -  60% Study Conclusions  - Left ventricle: The cavity size was normal. Systolic function was normal. The estimated ejection fraction was in the range of 55% to 60%. Wall motion was normal; there were no regional wall motion abnormalities. - Left atrium: The atrium was mildly dilated.   Laboratory Data:  Chemistry Recent Labs  Lab 07/02/17 1304  NA 136  K 4.6  CL 104  CO2 25  GLUCOSE 101*  BUN 19  CREATININE 1.23  CALCIUM 9.0  GFRNONAA 58*  GFRAA >60  ANIONGAP 7    No results for input(s): PROT, ALBUMIN, AST, ALT, ALKPHOS, BILITOT in the last 168 hours. Hematology Recent Labs  Lab 07/02/17 1304  WBC 9.0  RBC 4.82  HGB 13.9  HCT 41.8  MCV 86.7  MCH 28.8  MCHC 33.3  RDW 13.3  PLT 249   Cardiac EnzymesNo results for input(s): TROPONINI in the last 168 hours.  Recent Labs  Lab 07/02/17 1318  TROPIPOC 1.15*    BNPNo results for input(s): BNP, PROBNP in the last 168 hours.  DDimer No results for input(s): DDIMER in the last 168 hours.  Radiology/Studies:  Dg Chest 2 View  Result Date: 07/02/2017 CLINICAL DATA:  71 year old male with history of chest pain, shortness of breath and dizziness worsening over the past 4 days. EXAM: CHEST  2 VIEW COMPARISON:  Chest x-ray 07/20/2011.  Chest CT 11/17/2013. FINDINGS: Lung  volumes are normal. No consolidative airspace disease. No pleural effusions. No pneumothorax. No pulmonary nodule or mass noted. Pulmonary vasculature and the cardiomediastinal silhouette are within normal limits. Atherosclerosis in the thoracic aorta. Orthopedic fixation hardware incompletely visualized in the lower cervical spine. IMPRESSION: 1.  No radiographic evidence of acute cardiopulmonary disease. 2. Aortic atherosclerosis. Electronically Signed   By: Trudie Reed M.D.   On: 07/02/2017 13:38    Assessment and Plan:   1. NSTEMI: 4 days onset of exertional shortness breath and the chest discomfort.  - Risk and benefit of procedure explained to the patient who display clear understanding and agree to proceed.  Discussed with patient possible procedural risk include bleeding, vascular injury, renal injury, arrythmia, MI, stroke and loss of limb or life.   2. CAD: DES to LAD and PTCA of LCx in 2001  3. HTN: Blood pressure mildly elevated, on amlodipine at home, however does not appears to be on a beta blocker  4. HLD: On Lipitor 40 mg daily  5. R foot sprain: will order R foot x ray   Severity of Illness: The appropriate patient status for this patient is INPATIENT. Inpatient status is judged to be reasonable and necessary in order to provide the required intensity of service to ensure the patient's safety. The patient's presenting symptoms, physical exam findings, and initial radiographic and laboratory data in the context of their chronic comorbidities is felt to place them at high risk for further clinical deterioration. Furthermore, it is not anticipated that the patient will be medically stable for discharge from the hospital  within 2 midnights of admission. The following factors support the patient status of inpatient.   " The patient's presenting symptoms include chest pain. " The worrisome physical exam findings include benign. " The initial radiographic and laboratory data  are worrisome because of elevated troponin. " The chronic co-morbidities include CAD   * I certify that at the point of admission it is my clinical judgment that the patient will require inpatient hospital care spanning beyond 2 midnights from the point of admission due to high intensity of service, high risk for further deterioration and high frequency of surveillance required.*    For questions or updates, please contact [facility] Please consult [email or web address] for contact info under Cardiology/STEMI.    Ramond Dial, [place]  07/02/2017 2:16 PM   I have personally seen and examined this patient with Azalee Course, PA. I agree with the assessment and plan as outlined above. Mr. Biel is a patient of Dr. Donnie Aho. He is known to have CAD with prior stenting of the LAD in 2001 and balloon angioplasty of the Circumflex at that time. He has done well since then with medical management of his CAD. Last cath in 2012 with patent LAD stent and patent angioplasty site in Circumflex. Now having chest pain for several days. Troponin is elevated at 1.15. EKG without ischemic changes. I have reviewed his EKG and it shows sinus rhythm with subtle T wave abnormalities.  My exam shows:  General: Well developed, well nourished, NAD  HEENT: OP clear, mucus membranes moist  SKIN: warm, dry. No rashes. Neuro: No focal deficits  Musculoskeletal: Muscle strength 5/5 all ext  Psychiatric: Mood and affect normal  Neck: No JVD, no carotid bruits, no thyromegaly, no lymphadenopathy.  Lungs:Clear bilaterally, no wheezes, rhonci, crackles Cardiovascular: Regular rate and rhythm. No murmurs, gallops or rubs. Abdomen:Soft. Bowel sounds present. Non-tender.  Extremities: No lower extremity edema. Pulses are 2 + in the bilateral DP/PT.  Labs reviewed by me. Creat 1.2.  Plan:   1. NSTEMI: Pt with chest pain c/w unstable angina. Troponin is elevated. Will plan cardiac cath today to exclude progression of CAD. I have  outlined the plan for cardiac cath and possible PCI.   I have reviewed the risks, indications, and alternatives to cardiac catheterization, possible angioplasty, and stenting with the patient. Risks include but are not limited to bleeding, infection, vascular injury, stroke, myocardial infection, arrhythmia, kidney injury, radiation-related injury in the case of prolonged fluoroscopy use, emergency cardiac surgery, and death. The patient understands the risks of serious complication is 1-2 in 1000 with diagnostic cardiac cath and 1-2% or less with angioplasty/stenting.  Family at bedside and updated of plan.   Verne Carrow 07/02/2017 2:46 PM

## 2017-07-02 NOTE — ED Notes (Signed)
In creased Troponin

## 2017-07-02 NOTE — Progress Notes (Signed)
   07/02/17 2345  Vitals  BP 119/82  MAP (mmHg) 91  Pulse Rate (!) 149  ECG Heart Rate (!) 149  Resp 13  Oxygen Therapy  SpO2 97 %  Metoprolol 2.5 mg IV given.

## 2017-07-02 NOTE — ED Notes (Signed)
RN notified about elevated Trop.

## 2017-07-02 NOTE — Significant Event (Addendum)
Rapid Response Event Note  Overview:Code STEMI activated at 1937.  Pt mid-sternal CP 10/10 Time Called: 1937 Arrival Time: 1940 Event Type: Cardiac  Initial Focused Assessment: Pt s/p cath with DES to mid LAD today.  Pt developed mid-sternal CP approx. 1800.  Pt alert and oriented, skin warm and dry, c/o 6/10 chest pain.  No SOB noted.  VSS: T-97.9, HR-70, BP-148/71, RR-15, SpO2-99% on RA. NTG SL and ntg gtt and 2mg  morphine given PTA RRT. EKG showed J-point elevation with reciprocal changes.  Bjorn Loserhonda, PA at beside. Dr. Mayford Knifeurner and Dr. Clifton JamesMcAlhany on way to see patient.  Interventions: 4000 unit heparin bolus given, consent signed. Pt transferred to cath lab. Plan of Care (if not transferred):  Pt transferred to cath lab. Call RRT if further assistance needed.  Event Summary: Name of Physician Notified: Theodore Demarkhonda Barrett, PA  at (PTA RRT)  Name of Consulting Physician Notified: Dr. Mayford Knifeurner, Dr. Clifton JamesMcAlhany at (PTA RRT)  Outcome: Transferred (Comment)(to cath lab)  Event End Time: 2010  Terrilyn SaverHopper, Broady Lafoy Anderson

## 2017-07-03 ENCOUNTER — Inpatient Hospital Stay (HOSPITAL_COMMUNITY): Payer: Medicare Other

## 2017-07-03 DIAGNOSIS — I480 Paroxysmal atrial fibrillation: Secondary | ICD-10-CM

## 2017-07-03 DIAGNOSIS — I340 Nonrheumatic mitral (valve) insufficiency: Secondary | ICD-10-CM

## 2017-07-03 LAB — LIPID PANEL
CHOL/HDL RATIO: 6.4 ratio
Cholesterol: 219 mg/dL — ABNORMAL HIGH (ref 0–200)
HDL: 34 mg/dL — ABNORMAL LOW (ref >40)
LDL Cholesterol: 131 mg/dL — ABNORMAL HIGH (ref 0–99)
Triglycerides: 272 mg/dL — ABNORMAL HIGH (ref <150)
VLDL: 54 mg/dL — ABNORMAL HIGH (ref 0–40)

## 2017-07-03 LAB — CBC
HCT: 36.1 % — ABNORMAL LOW (ref 39.0–52.0)
Hemoglobin: 12.3 g/dL — ABNORMAL LOW (ref 13.0–17.0)
MCH: 28.9 pg (ref 26.0–34.0)
MCHC: 34.1 g/dL (ref 30.0–36.0)
MCV: 84.7 fL (ref 78.0–100.0)
PLATELETS: 237 10*3/uL (ref 150–400)
RBC: 4.26 MIL/uL (ref 4.22–5.81)
RDW: 13.3 % (ref 11.5–15.5)
WBC: 7.9 10*3/uL (ref 4.0–10.5)

## 2017-07-03 LAB — BASIC METABOLIC PANEL
ANION GAP: 5 (ref 5–15)
BUN: 15 mg/dL (ref 6–20)
CALCIUM: 8.2 mg/dL — AB (ref 8.9–10.3)
CHLORIDE: 107 mmol/L (ref 101–111)
CO2: 23 mmol/L (ref 22–32)
CREATININE: 1.06 mg/dL (ref 0.61–1.24)
GFR calc non Af Amer: >60 mL/min (ref >60)
Glucose, Bld: 133 mg/dL — ABNORMAL HIGH (ref 65–99)
Potassium: 3.9 mmol/L (ref 3.5–5.1)
SODIUM: 135 mmol/L (ref 135–145)

## 2017-07-03 LAB — ECHOCARDIOGRAM COMPLETE
HEIGHTINCHES: 69 in
WEIGHTICAEL: 3199.32 [oz_av]

## 2017-07-03 LAB — HEMOGLOBIN A1C
Hgb A1c MFr Bld: 5.9 % — ABNORMAL HIGH (ref 4.8–5.6)
MEAN PLASMA GLUCOSE: 123 mg/dL

## 2017-07-03 LAB — MRSA PCR SCREENING: MRSA by PCR: NEGATIVE

## 2017-07-03 MED ORDER — METOPROLOL TARTRATE 12.5 MG HALF TABLET
12.5000 mg | ORAL_TABLET | Freq: Two times a day (BID) | ORAL | Status: DC
  Administered 2017-07-03 – 2017-07-04 (×4): 12.5 mg via ORAL
  Filled 2017-07-03 (×4): qty 1

## 2017-07-03 MED ORDER — METOPROLOL TARTRATE 5 MG/5ML IV SOLN: 2.5000 mg | INTRAVENOUS | Status: DC | PRN

## 2017-07-03 NOTE — Progress Notes (Addendum)
[id number] MI/stent education completed with patient and pt's wife including CP, NTG use and calling 911, restrictions, risk factors, and activity progression. MI book, heart healthy diet and exercise guidelines given. Discussed phase 2 cardiac rehab and permission given to send contact information to program at [facility]. Per patient's nurse, will hold ambulation pending orthopedic consult regarding possible foot fx. Pt will walk later with nursing staff if indicated. Artist Paislinty M Kimber Fritts, MS, ACSM CEP

## 2017-07-03 NOTE — Significant Event (Signed)
Called re HR 150-  ECG and tele look like atrial flutter-- gave 2.5 mg metoprolol and flutter waves more apparent.  Currently afib rate 90s.  New onset.  Given Aggrastat infusion and some slight oozing from IV sites including RFA, will hold off on systemic heparin at this time. 12.5 metop BID started for rate control.   Cleta AlbertsGreg Jordann Grime, MD

## 2017-07-03 NOTE — Progress Notes (Signed)
Progress Note  Patient Name: Clayton Anderson Date of Encounter: 07/03/2017  Primary Cardiologist: Dr. Donnie Ahoilley  Subjective   Admitted yesterday with [place]. Had PCI of LAD. Complicated by acute stent thrombosis. Taken back to lab with PCI of LAD.   Overnight developed brief episode of PAFL which resolved.   Had some chest last night prior to the second cardiac catheterization, but no further chest pain. Some SOB.   Inpatient Medications    Scheduled Meds: . amLODipine  5 mg Oral Daily  . aspirin EC  81 mg Oral Daily  . atorvastatin  40 mg Oral q1800  . buPROPion  150 mg Oral Q breakfast  . escitalopram  10 mg Oral Daily  . folic acid  1 mg Oral Daily  . gabapentin  300 mg Oral QHS  . metoprolol tartrate  12.5 mg Oral BID  . pantoprazole  40 mg Oral Daily  . ramipril  10 mg Oral Daily  . sodium chloride flush  3 mL Intravenous Q12H  . sodium chloride flush  3 mL Intravenous Q12H  . ticagrelor  90 mg Oral BID   Continuous Infusions: . sodium chloride 250 mL (07/03/17 0616)  . tirofiban 0.15 mcg/kg/min (07/03/17 1100)   PRN Meds: sodium chloride, acetaminophen, alum & mag hydroxide-simeth, metoprolol tartrate, morphine injection, nitroGLYCERIN, ondansetron (ZOFRAN) IV, oxyCODONE, sodium chloride flush, sodium chloride flush, tiZANidine   Vital Signs    Vitals:   07/03/17 0800 07/03/17 0900 07/03/17 1156 07/03/17 1200  BP: (!) 144/72  (!) 160/68 (!) 165/65  Pulse: (!) 54 (!) 59 (!) 56 (!) 54  Resp: 13 17 17 15   Temp:  98 F (36.7 C)    TempSrc:      SpO2: 100% 98% 100% 100%  Weight:      Height:        Intake/Output Summary (Last 24 hours) at 07/03/2017 1212 Last data filed at 07/03/2017 1126 Gross per 24 hour  Intake 1408.8 ml  Output 800 ml  Net 608.8 ml   Filed Weights   07/02/17 1250 07/02/17 2200  Weight: 200 lb (90.7 kg) 199 lb 15.3 oz (90.7 kg)    Telemetry    Atrial flutter with RVR last night - Personally Reviewed  ECG    Sinus bradycardia with  TWI in anterior leads - Personally Reviewed  Physical Exam   GEN: No acute distress.   Neck: No JVD Cardiac: RRR, no murmurs, rubs, or gallops.  Respiratory: Clear to auscultation bilaterally. GI: Soft, nontender, non-distended  MS: No edema; No deformity. Neuro:  Nonfocal  Psych: Normal affect   Labs    Chemistry Recent Labs  Lab 07/02/17 1304 07/03/17 0154  NA 136 135  K 4.6 3.9  CL 104 107  CO2 25 23  GLUCOSE 101* 133*  BUN 19 15  CREATININE 1.23 1.06  CALCIUM 9.0 8.2*  GFRNONAA 58* >60  GFRAA >60 >60  ANIONGAP 7 5     Hematology Recent Labs  Lab 07/02/17 1304 07/03/17 0154  WBC 9.0 7.9  RBC 4.82 4.26  HGB 13.9 12.3*  HCT 41.8 36.1*  MCV 86.7 84.7  MCH 28.8 28.9  MCHC 33.3 34.1  RDW 13.3 13.3  PLT 249 237    Cardiac Enzymes Recent Labs  Lab 07/02/17 1812 07/02/17 2201  TROPONINI 2.11* 3.86*    Recent Labs  Lab 07/02/17 1318  TROPIPOC 1.15*     BNPNo results for input(s): BNP, PROBNP in the last 168 hours.   DDimer  No results for input(s): DDIMER in the last 168 hours.   Radiology    Dg Chest 2 View  Result Date: 07/02/2017 CLINICAL DATA:  71 year old male with history of chest pain, shortness of breath and dizziness worsening over the past 4 days. EXAM: CHEST  2 VIEW COMPARISON:  Chest x-ray 07/20/2011.  Chest CT 11/17/2013. FINDINGS: Lung volumes are normal. No consolidative airspace disease. No pleural effusions. No pneumothorax. No pulmonary nodule or mass noted. Pulmonary vasculature and the cardiomediastinal silhouette are within normal limits. Atherosclerosis in the thoracic aorta. Orthopedic fixation hardware incompletely visualized in the lower cervical spine. IMPRESSION: 1.  No radiographic evidence of acute cardiopulmonary disease. 2. Aortic atherosclerosis. Electronically Signed   By: Trudie Reedaniel  Entrikin M.D.   On: 07/02/2017 13:38   Dg Ankle 2 Views Right  Result Date: 07/02/2017 CLINICAL DATA:  Right ankle pain. The patient  reports suffering a fall in the past week. Initial encounter. EXAM: RIGHT ANKLE - 2 VIEW COMPARISON:  None. FINDINGS: There is some soft tissue swelling about the lateral aspect of the ankle. The patient has a nondisplaced fracture of the distal diaphysis of the fibula. No other acute bony abnormality is seen. Calcaneal spur noted. IMPRESSION: Nondisplaced fracture distal diaphysis right fibula. Electronically Signed   By: Drusilla Kannerhomas  Dalessio M.D.   On: 07/02/2017 19:33    Cardiac Studies   Cath 07/02/2017 Conclusion     Post intervention, there is a 0% residual stenosis.  A drug-eluting stent was successfully placed using a STENT RESOLUTE ONYX 3.0X8.  Post intervention, there is a 0% residual stenosis.   1. Patent left main with mild nonobstructive disease 2. Severe proximal LAD stenosis involving tandem lesions in the LAD ostium and severe in-stent restenosis within the remotely placed bare metal stent 3. Moderate diffuse LCx stenosis 4. Mild nonobstructive RCA stenosis 5. Mild segmental contraction abnormality of the left ventricle with estimated LVEF 50-55% 6. Successful PCI of the proximal LAD treated with overlapping DES and PTCA of the first diagonal origin  Recommend: DAPT with ASA and brilinta at least 12 months. OK for discharge tomorrow if no complications arise.       Cath 07/02/2017 Conclusion     Previously placed Dist LM to Prox LAD stent (unknown type) is widely patent.  A drug-eluting stent was successfully placed using a STENT RESOLUTE ONYX 2.5X18.  Post intervention, there is a 0% residual stenosis.  Prox LAD lesion is 100% stenosed.  Post intervention, there is a 0% residual stenosis.  Prox LAD to Mid LAD lesion is 100% stenosed.  A drug-eluting stent was successfully placed using a STENT RESOLUTE ONYX [id number].  Post intervention, there is a 0% residual stenosis.   1. Acute closure of the mid LAD beyond the previously placed stent with anterior  STEMI. The cause for vessel closure is likely from an edge dissection that was not visible on the final angiography earlier today. The final angiography from the initial PCI today demonstrated an optimal result with excellent flow into the distal vessel 2. Successful PTCA/DES x 2 in the mid LAD  Recommendations: Will admit to ICU. Will continue Aggrastat x 18 hours post stent. Continue Brilinta/ASA/statin/beta blocker.       Patient Profile     71 y.o. male with a history of HTN, HLD, RLS and CAD presented with 4 days onset of increase DOE and chest pain reminiscent of previous angina. Trop elevated. He underwent DES to prox LAD, however had anterior STEMI a few hours after  the PCI due to acute closure of mid LAD due to edge dissection. Underwent PTCA and DES x 2 to mid LAD. Went into new afib on the night of 11/23   Assessment & Plan    1. NSTEMI: Cath 07/02/2017 DES to prox LAD. Complicated by edge dissection and STEMI a few hours later with stent occlusion, underwent DES x 2 to mid LAD.   - plan for aggrastat x 18 hours post cath. Pending echo  - continue ASA, brilinta, change statin as below. On low dose metoprolol, monitor HR and sinus pause.   2. CAD: DES to LAD and PTCA of LCx in 2001  3. New atrial fibrillation/flutter with RVR: went into afib last night, received lopressor, converted on rate control.   - This patients CHA2DS2-VASc Score and unadjusted Ischemic Stroke Rate (% per year) is equal to 3.2 % stroke rate/year from a score of 3  Above score calculated as 1 point each if present [CHF, HTN, DM, Vascular=MI/PAD/Aortic Plaque, Age if 65-74, or Male] Above score calculated as 2 points each if present [Age > 75, or Stroke/TIA/TE]         - Plan to hold off initiating systemic anticoagulation given recent STEMI. Also patient reportedly had a syncope event in the past week. Discussed with Dr. Gala Romney, plan for outpatient 30 day event monitor upon discharge.   4. HTN:  started on low dose metoprolol during this hospitalization. Had 2.54 sec pause overnight and afib with RVR, now in sinus bradycardia HR low 50s  5. HLD: On Lipitor 40 mg daily at home, fasting LDL 131 this morning. Triglyceride 272. Will aggressively control cholesterol by switching to Crestor 40mg  daily. 6-8 FLP and FLT as outpatient.  6. R foot sprain: R ankle x ray showed nondisplaced fibular fracture, discussed with Dr. Everardo Pacific on call orthopedic surgeon who recommended Cam Walker Boot and further weight bearing recommendation per orthopedic.    For questions or updates, please contact [facility] Please consult [email or web address] for contact info under Cardiology/STEMI.      Ramond Dial, PA  07/03/2017, 12:12 PM   Pager: [phone]  Patient seen and examined with the above-signed Advanced Practice Provider and/or Housestaff. I personally reviewed laboratory data, imaging studies and relevant notes. I independently examined the patient and formulated the important aspects of the plan. I have edited the note to reflect any of my changes or salient points. I have personally discussed the plan with the patient and/or family.  Events of last night noted. He is much improved this am. No further CP. Back in NSR. Will complete aggrestat infusion. Agree with other meds as above. Keep in ICU today. Consult CR.   He denies h/o AF/AFL will not obligate him to long-term [facility] with one sort-lived episode of AF/AFL in setting of MI. However if recurs will need AC. Will need 30-day even monitor given recent syncope with fall and ankle fracture. No driving for 30 days.  Ankle fracture d/w Ortho as above.  Arvilla Meres, MD  1:07 PM

## 2017-07-03 NOTE — Progress Notes (Signed)
  Echocardiogram 2D Echocardiogram has been performed.  Leta JunglingCooper, Jaliyah Fotheringham M 07/03/2017, 1:35 PM

## 2017-07-03 NOTE — Plan of Care (Signed)
Post cath sites- Right radial and Right femoral site with oozing. Patient is currently on Aggrastat. Patient needs frequent reinforcement regarding movement and use of right wrist and right leg.

## 2017-07-03 NOTE — Progress Notes (Signed)
Orthopedic Tech Progress Note Patient Details:  Clayton HugerCarl D Anderson 01/01/1946 [id number]  Ortho Devices Type of Ortho Device: CAM walker Ortho Device/Splint Location: rle Ortho Device/Splint Interventions: Application   Clayton Anderson 07/03/2017, 1:15 PM

## 2017-07-03 NOTE — Plan of Care (Signed)
Pt progressing toward goals.Clayton Anderson 

## 2017-07-04 LAB — POCT ACTIVATED CLOTTING TIME
Activated Clotting Time: 0 s
Activated Clotting Time: 604 seconds

## 2017-07-04 MED ORDER — AMLODIPINE BESYLATE 10 MG PO TABS
10.0000 mg | ORAL_TABLET | Freq: Every day | ORAL | Status: DC
  Administered 2017-07-05: 10 mg via ORAL
  Filled 2017-07-04: qty 1

## 2017-07-04 MED ORDER — SPIRONOLACTONE 25 MG PO TABS
12.5000 mg | ORAL_TABLET | Freq: Every day | ORAL | Status: DC
  Administered 2017-07-04 – 2017-07-05 (×2): 12.5 mg via ORAL
  Filled 2017-07-04 (×2): qty 1

## 2017-07-04 MED ORDER — ROSUVASTATIN CALCIUM 10 MG PO TABS
40.0000 mg | ORAL_TABLET | Freq: Every day | ORAL | Status: DC
  Administered 2017-07-04: 40 mg via ORAL
  Filled 2017-07-04 (×2): qty 4

## 2017-07-04 MED ORDER — CARVEDILOL 3.125 MG PO TABS
3.1250 mg | ORAL_TABLET | Freq: Two times a day (BID) | ORAL | Status: DC
  Administered 2017-07-04 – 2017-07-05 (×2): 3.125 mg via ORAL
  Filled 2017-07-04 (×2): qty 1

## 2017-07-04 NOTE — Consult Note (Signed)
ORTHOPAEDIC CONSULTATION  REQUESTING PHYSICIAN: Jacolyn Reedy, MD  Chief Complaint: R ankle pain  HPI: Clayton Anderson is a 71 y.o. male who complains of  Fall about 5-7 days prior to admission resulting in R ankle pain.  Patient had been walking on it and thought it was sprained and was admitted for cardiac reasons.  Cards XR'd ankle and found non-displaced fibula fracture.  Patient states no other areas of injury.  Ankle overall is feeling somewhat better than initially.  No numbness or tingling.  Past Medical History:  Diagnosis Date  . Anxiety   . Arthritis   . Baker's cyst of knee    left  . Cervical disc disease 07/20/2011  . Complication of anesthesia    " I wake up in a combative mode"  . Coronary artery disease   . Depression   . Elevated cholesterol   . GERD (gastroesophageal reflux disease)   . Headache(784.0)    years ago  . History of hiatal hernia   . Hypertension   . Myocardial infarct [facility])    2001  sees Dr. Thurman Coyer  . Neuromuscular disorder (HCC)    hx of shingles/ myofacitis  . Shortness of breath    Past Surgical History:  Procedure Laterality Date  . COLONOSCOPY    . CORONARY ANGIOPLASTY     x 2  last one 07/2011  . CORONARY STENT PLACEMENT  2001   Tetra Multi Link Stent-3T MRI compatible, normal mode  . LAMINECTOMY AND MICRODISCECTOMY CERVICAL SPINE     January and August 2010  . LEFT HEART CATHETERIZATION WITH CORONARY ANGIOGRAM N/A 07/20/2011   Procedure: LEFT HEART CATHETERIZATION WITH CORONARY ANGIOGRAM;  Surgeon: Jacolyn Reedy, MD;  Location: [facility] CATH LAB;  Service: Cardiovascular;  Laterality: N/A;  . SHOULDER ARTHROSCOPY WITH LABRAL REPAIR Left 12/01/2012   Procedure: LEFT SHOULDER ARTHROSCOPY WITH ASPIRATION OF DEGENERATIVE OF THE PARALABRAL CYST and OPEN DECOMPRESSION OF SUPRASCAPULAR NERVE;  Surgeon: Marin Shutter, MD;  Location: [place];  Service: Orthopedics;  Laterality: Left;  . SHOULDER SURGERY     twice  left shoulder    Social History   Socioeconomic History  . Marital status: Married    Spouse name: Clayton Anderson  . Number of children: Y  . Years of education: None  . Highest education level: None  Social Needs  . Financial resource strain: None  . Food insecurity - worry: None  . Food insecurity - inability: None  . Transportation needs - medical: None  . Transportation needs - non-medical: None  Occupational History  . Occupation: retired Dealer  Tobacco Use  . Smoking status: Former Smoker    Packs/day: 0.90    Years: 40.00    Pack years: 36.00    Types: Cigarettes    Last attempt to quit: 07/19/2004    Years since quitting: 12.9  . Smokeless tobacco: Never Used  Substance and Sexual Activity  . Alcohol use: Yes    Alcohol/week: 0.0 oz    Comment: occasionally  . Drug use: No  . Sexual activity: No  Other Topics Concern  . None  Social History Narrative  . None   Family History  Problem Relation Age of Onset  . Alcohol abuse Father   . Hyperlipidemia Father   . Birth defects Brother   . Diabetes Maternal Grandmother    Allergies  Allergen Reactions  . Sulfa Antibiotics Hives and Other (See Comments)  . Tetanus Toxoids Swelling and  Other (See Comments)   Prior to Admission medications   Medication Sig Start Date End Date Taking? Authorizing Provider  amLODipine (NORVASC) 5 MG tablet Take 5 mg by mouth daily.     Yes [provider]  aspirin EC 81 MG tablet Take 81 mg by mouth daily.     Yes [provider]  atorvastatin (LIPITOR) 40 MG tablet Take 40 mg by mouth daily at 6 PM.    Yes [provider]  buPROPion (WELLBUTRIN XL) 150 MG 24 hr tablet Take 150 mg by mouth every morning. 05/23/17  Yes [provider]  escitalopram (LEXAPRO) 10 MG tablet Take 10 mg by mouth daily. Reported on 02/03/2016   Yes [provider]  esomeprazole (NEXIUM) 40 MG capsule Take 40 mg by mouth daily before breakfast.   Yes [provider]   folic acid (FOLVITE) 389 MCG tablet Take 800 mcg by mouth daily. Reported on 02/03/2016   Yes [provider]  gabapentin (NEURONTIN) 300 MG capsule Take 300 mg by mouth at bedtime.    Yes [provider]  naproxen (NAPROSYN) 500 MG tablet Take 500 mg by mouth 2 (two) times daily. 04/29/17  Yes [provider]  nitroGLYCERIN (NITROSTAT) 0.4 MG SL tablet Place 0.4 mg under the tongue every 5 (five) minutes as needed.     Yes [provider]  Oxycodone HCl 20 MG TABS Take 40-60 mg by mouth every 4 (four) hours as needed (pain).   Yes [provider]  ramipril (ALTACE) 10 MG capsule Take 10 mg by mouth daily.     Yes [provider]  tiZANidine (ZANAFLEX) 2 MG tablet Take 2-4 mg by mouth 4 (four) times daily as needed for muscle spasms. 06/07/17  Yes [provider]  tiotropium (SPIRIVA) 18 MCG inhalation capsule Place 1 capsule (18 mcg total) into inhaler and inhale daily. Patient not taking: Reported on 03/16/2016 05/19/12   Kathee Delton, MD   Dg Chest 2 View  Result Date: 07/02/2017 CLINICAL DATA:  71 year old male with history of chest pain, shortness of breath and dizziness worsening over the past 4 days. EXAM: CHEST  2 VIEW COMPARISON:  Chest x-ray 07/20/2011.  Chest CT 11/17/2013. FINDINGS: Lung volumes are normal. No consolidative airspace disease. No pleural effusions. No pneumothorax. No pulmonary nodule or mass noted. Pulmonary vasculature and the cardiomediastinal silhouette are within normal limits. Atherosclerosis in the thoracic aorta. Orthopedic fixation hardware incompletely visualized in the lower cervical spine. IMPRESSION: 1.  No radiographic evidence of acute cardiopulmonary disease. 2. Aortic atherosclerosis. Electronically Signed   By: Vinnie Langton M.D.   On: 07/02/2017 13:38   Dg Ankle 2 Views Right  Result Date: 07/02/2017 CLINICAL DATA:  Right ankle pain. The patient reports suffering a fall in the past week.  Initial encounter. EXAM: RIGHT ANKLE - 2 VIEW COMPARISON:  None. FINDINGS: There is some soft tissue swelling about the lateral aspect of the ankle. The patient has a nondisplaced fracture of the distal diaphysis of the fibula. No other acute bony abnormality is seen. Calcaneal spur noted. IMPRESSION: Nondisplaced fracture distal diaphysis right fibula. Electronically Signed   By: Inge Rise M.D.   On: 07/02/2017 19:33   Family History Reviewed and non-contributory, no pertinent history of problems with bleeding or anesthesia      Review of Systems 14 system ROS conducted and negative except for that noted in HPI   OBJECTIVE  Vitals: Patient Vitals for the past 8 hrs:  BP Temp Temp src Pulse Resp SpO2  07/04/17 0800 (!) 163/89 - - 67 (!) 23 98 %  07/04/17 0732 - 97.9 F (36.6 C) Oral - - -  07/04/17 0400 (!) 143/73 - - (!) 51 14 99 %  07/04/17 0200 (!) 141/68 - - (!) 56 - 91 %   General: Alert, no acute distress Cardiovascular: No pedal edema Respiratory: No cyanosis, no use of accessory musculature GI: No organomegaly, abdomen is soft and non-tender Skin: No lesions in the area of chief complaint other than those listed below in MSK exam.  Neurologic: Sensation intact distally save for the below mentioned MSK exam Psychiatric: Patient is competent for consent with normal mood and affect Lymphatic: No axillary or cervical lymphadenopathy Extremities  RLE: Moderate ttp over fibula, no proximal calf tenderness with syndesmosis squeeze or medial ttp.  Scattered ecchymosis.  + GS/TA/EHL. Sensation intact in DP/SP/S/S/P distributions. 2+ DP pulse with warm and well perfused digits. Compartments soft and compressible, with no pain on passive stretch.   Test Results Imaging 2 view of the R ankle demonstrates a non-displaced fibula fracture, oblique  Labs cbc Recent Labs    07/02/17 1304 07/03/17 0154  WBC 9.0 7.9  HGB 13.9 12.3*  HCT 41.8 36.1*  PLT 249 237    Labs  inflam No results for input(s): CRP in the last 72 hours.  Invalid input(s): ESR  Labs coag No results for input(s): INR, PTT in the last 72 hours.  Invalid input(s): PT  Recent Labs    07/02/17 1304 07/03/17 0154  NA 136 135  K 4.6 3.9  CL 104 107  CO2 25 23  GLUCOSE 101* 133*  BUN 19 15  CREATININE 1.23 1.06  CALCIUM 9.0 8.2*     ASSESSMENT AND PLAN: 71 y.o. male with the following: R non-displaced fibula fracture on which had been WBAT without boot for multiple days.    Patient stressed ankle with his continued activity, unlikely to need any further intervention.  CAM boot while walking, ok to be off for sleep and hygeine.  Patient can follow up with myself in 2 weeks and call for an appointment. Orthopedics signing off inpatient.  WBAT in boot.

## 2017-07-04 NOTE — Plan of Care (Signed)
  Cardiac: Vascular access site(s) Level 0-1 will be maintained 07/04/2017 2102 - Progressing by Luther Redourgott, Margarite Vessel, RN   Cardiac: Ability to achieve and maintain adequate cardiopulmonary perfusion will improve 07/04/2017 2102 - Progressing by Luther Redourgott, Chasity Outten, RN   Clinical Measurements: Ability to maintain clinical measurements within normal limits will improve 07/04/2017 2102 - Progressing by Luther Redourgott, Kealy Lewter, RN

## 2017-07-04 NOTE — Progress Notes (Signed)
Progress Note  Patient Name: Clayton Anderson Date of Encounter: 07/04/2017  Primary Cardiologist: Dr. Donnie Aho  Subjective   Admitted with [place]. Had PCI of LAD 11/23. Complicated by acute stent thrombosis. Taken back to lab with PCI of LAD.    Post cath developed brief episode of PAFL which resolved.   Feels good. No CP or SOB. No further AF.   Has been seen by ortho for R ankle fracture   Inpatient Medications    Scheduled Meds: . amLODipine  5 mg Oral Daily  . aspirin EC  81 mg Oral Daily  . atorvastatin  40 mg Oral q1800  . buPROPion  150 mg Oral Q breakfast  . escitalopram  10 mg Oral Daily  . folic acid  1 mg Oral Daily  . gabapentin  300 mg Oral QHS  . metoprolol tartrate  12.5 mg Oral BID  . pantoprazole  40 mg Oral Daily  . ramipril  10 mg Oral Daily  . sodium chloride flush  3 mL Intravenous Q12H  . sodium chloride flush  3 mL Intravenous Q12H  . ticagrelor  90 mg Oral BID   Continuous Infusions: . sodium chloride 250 mL (07/03/17 0616)   PRN Meds: sodium chloride, acetaminophen, alum & mag hydroxide-simeth, metoprolol tartrate, morphine injection, nitroGLYCERIN, ondansetron (ZOFRAN) IV, oxyCODONE, sodium chloride flush, sodium chloride flush, tiZANidine   Vital Signs    Vitals:   07/04/17 0200 07/04/17 0400 07/04/17 0732 07/04/17 0800  BP: (!) 141/68 (!) 143/73  (!) 163/89  Pulse: (!) 56 (!) 51  67  Resp:  14  (!) 23  Temp:   97.9 F (36.6 C)   TempSrc:   Oral   SpO2: 91% 99%  98%  Weight:      Height:        Intake/Output Summary (Last 24 hours) at 07/04/2017 1033 Last data filed at 07/04/2017 0800 Gross per 24 hour  Intake 960 ml  Output 925 ml  Net 35 ml   Filed Weights   07/02/17 1250 07/02/17 2200  Weight: 90.7 kg (200 lb) 90.7 kg (199 lb 15.3 oz)    Telemetry    Sinus 50-60 No recurrent AF Personally reviewed   ECG    Sinus bradycardia with TWI in anterior leads - Personally Reviewed  Physical Exam   GEN: No acute distress.     Neck: No JVD Cardiac: RRR, no murmurs, rubs, or gallops.  Respiratory: Clear to auscultation bilaterally. GI: Soft, nontender, non-distended  MS: No edema; No deformity. Neuro:  Nonfocal  Psych: Normal affect   Labs    Chemistry Recent Labs  Lab 07/02/17 1304 07/03/17 0154  NA 136 135  K 4.6 3.9  CL 104 107  CO2 25 23  GLUCOSE 101* 133*  BUN 19 15  CREATININE 1.23 1.06  CALCIUM 9.0 8.2*  GFRNONAA 58* >60  GFRAA >60 >60  ANIONGAP 7 5     Hematology Recent Labs  Lab 07/02/17 1304 07/03/17 0154  WBC 9.0 7.9  RBC 4.82 4.26  HGB 13.9 12.3*  HCT 41.8 36.1*  MCV 86.7 84.7  MCH 28.8 28.9  MCHC 33.3 34.1  RDW 13.3 13.3  PLT 249 237    Cardiac Enzymes Recent Labs  Lab 07/02/17 1812 07/02/17 2201  TROPONINI 2.11* 3.86*    Recent Labs  Lab 07/02/17 1318  TROPIPOC 1.15*     BNPNo results for input(s): BNP, PROBNP in the last 168 hours.   DDimer No results for input(s):  DDIMER in the last 168 hours.   Radiology    Dg Chest 2 View  Result Date: 07/02/2017 CLINICAL DATA:  71 year old male with history of chest pain, shortness of breath and dizziness worsening over the past 4 days. EXAM: CHEST  2 VIEW COMPARISON:  Chest x-ray 07/20/2011.  Chest CT 11/17/2013. FINDINGS: Lung volumes are normal. No consolidative airspace disease. No pleural effusions. No pneumothorax. No pulmonary nodule or mass noted. Pulmonary vasculature and the cardiomediastinal silhouette are within normal limits. Atherosclerosis in the thoracic aorta. Orthopedic fixation hardware incompletely visualized in the lower cervical spine. IMPRESSION: 1.  No radiographic evidence of acute cardiopulmonary disease. 2. Aortic atherosclerosis. Electronically Signed   By: Trudie Reedaniel  Entrikin M.D.   On: 07/02/2017 13:38   Dg Ankle 2 Views Right  Result Date: 07/02/2017 CLINICAL DATA:  Right ankle pain. The patient reports suffering a fall in the past week. Initial encounter. EXAM: RIGHT ANKLE - 2 VIEW  COMPARISON:  None. FINDINGS: There is some soft tissue swelling about the lateral aspect of the ankle. The patient has a nondisplaced fracture of the distal diaphysis of the fibula. No other acute bony abnormality is seen. Calcaneal spur noted. IMPRESSION: Nondisplaced fracture distal diaphysis right fibula. Electronically Signed   By: Drusilla Kannerhomas  Dalessio M.D.   On: 07/02/2017 19:33    Cardiac Studies   Cath 07/02/2017 Conclusion     Post intervention, there is a 0% residual stenosis.  A drug-eluting stent was successfully placed using a STENT RESOLUTE ONYX 3.0X8.  Post intervention, there is a 0% residual stenosis.   1. Patent left main with mild nonobstructive disease 2. Severe proximal LAD stenosis involving tandem lesions in the LAD ostium and severe in-stent restenosis within the remotely placed bare metal stent 3. Moderate diffuse LCx stenosis 4. Mild nonobstructive RCA stenosis 5. Mild segmental contraction abnormality of the left ventricle with estimated LVEF 50-55% 6. Successful PCI of the proximal LAD treated with overlapping DES and PTCA of the first diagonal origin  Recommend: DAPT with ASA and brilinta at least 12 months. OK for discharge tomorrow if no complications arise.       Cath 07/02/2017 Conclusion     Previously placed Dist LM to Prox LAD stent (unknown type) is widely patent.  A drug-eluting stent was successfully placed using a STENT RESOLUTE ONYX 2.5X18.  Post intervention, there is a 0% residual stenosis.  Prox LAD lesion is 100% stenosed.  Post intervention, there is a 0% residual stenosis.  Prox LAD to Mid LAD lesion is 100% stenosed.  A drug-eluting stent was successfully placed using a STENT RESOLUTE ONYX [id number].  Post intervention, there is a 0% residual stenosis.   1. Acute closure of the mid LAD beyond the previously placed stent with anterior STEMI. The cause for vessel closure is likely from an edge dissection that was not visible  on the final angiography earlier today. The final angiography from the initial PCI today demonstrated an optimal result with excellent flow into the distal vessel 2. Successful PTCA/DES x 2 in the mid LAD  Recommendations: Will admit to ICU. Will continue Aggrastat x 18 hours post stent. Continue Brilinta/ASA/statin/beta blocker.       Patient Profile     71 y.o. male with a history of HTN, HLD, RLS and CAD presented with 4 days onset of increase DOE and chest pain reminiscent of previous angina. Trop elevated. He underwent DES to prox LAD, however had anterior STEMI a few hours after the PCI due to  acute closure of mid LAD due to edge dissection. Underwent PTCA and DES x 2 to mid LAD. Went into new afib on the night of 11/23   Assessment & Plan    1. NSTEMI/CAD: Cath 07/02/2017 DES to prox LAD. Complicated by edge dissection and STEMI a few hours later with stent occlusion, underwent DES x 2 to mid LAD. Treated with aggrastat x 18 hours post cath.  - Echo EF 45-50% (reviewed personally) - continue ASA, brilinta, low-dose b-blocker, stain and ACE - CR seeing - to tele today. Home in am    2. HTN - BP remains elevated. Add spiro 12.5. Increase amlodipine to 10  3. New atrial fibrillation/flutter with RVR: had brief episode of afib post cath. Converted spontaneously. No recurrence - This patients CHA2DS2-VASc Score and unadjusted Ischemic Stroke Rate (% per year) is equal to 3.2 % stroke rate/year from a score of 3  5. HLD:  -On Lipitor 40 mg daily at home, fasting LDL 131  - Switched to crestor 40. May need zetia or PCSK9 to reach goal  6. R fibula fracture: - Ortho input appreciated. Given walking boot   7. Syncope with fall (pre-hospital) - Will need 30-day montor to assess for recurrent AF or high-grade arrhythmias - no driving x 6 moths   For questions or updates, please contact [facility] Please consult [email or web address] for contact info under Cardiology/STEMI.        Signed, Arvilla Meresaniel Bensimhon, MD  07/04/2017, 10:33 AM   Pager: [phone]

## 2017-07-05 ENCOUNTER — Encounter (HOSPITAL_COMMUNITY): Payer: Self-pay | Admitting: Cardiovascular Disease

## 2017-07-05 LAB — BASIC METABOLIC PANEL
ANION GAP: 8 (ref 5–15)
BUN: 15 mg/dL (ref 6–20)
CHLORIDE: 104 mmol/L (ref 101–111)
CO2: 24 mmol/L (ref 22–32)
Calcium: 8.7 mg/dL — ABNORMAL LOW (ref 8.9–10.3)
Creatinine, Ser: 1.12 mg/dL (ref 0.61–1.24)
GFR calc Af Amer: >60 mL/min (ref >60)
GFR calc non Af Amer: >60 mL/min (ref >60)
GLUCOSE: 103 mg/dL — AB (ref 65–99)
POTASSIUM: 3.9 mmol/L (ref 3.5–5.1)
Sodium: 136 mmol/L (ref 135–145)

## 2017-07-05 MED ORDER — SPIRONOLACTONE 25 MG PO TABS: 12.5000 mg | ORAL_TABLET | Freq: Every day | ORAL | Status: DC

## 2017-07-05 MED ORDER — CARVEDILOL 3.125 MG PO TABS: 3.1250 mg | ORAL_TABLET | Freq: Two times a day (BID) | ORAL | 12 refills | Status: DC

## 2017-07-05 MED ORDER — TICAGRELOR 90 MG PO TABS: 90.0000 mg | ORAL_TABLET | Freq: Two times a day (BID) | ORAL | 12 refills | Status: DC

## 2017-07-05 MED ORDER — ROSUVASTATIN CALCIUM 40 MG PO TABS: 40.0000 mg | ORAL_TABLET | Freq: Every day | ORAL | 12 refills | Status: DC

## 2017-07-05 MED ORDER — AMLODIPINE BESYLATE 10 MG PO TABS: 10.0000 mg | ORAL_TABLET | Freq: Every day | ORAL | 12 refills | Status: DC

## 2017-07-05 MED FILL — Lidocaine HCl Local Preservative Free (PF) Inj 1%: INTRAMUSCULAR | Qty: 30 | Status: AC

## 2017-07-05 NOTE — Discharge Summary (Signed)
Physician Discharge Summary  Patient ID: Clayton HugerCarl D Mckissack MRN: [id number] DOB/AGE: 70/11/1945 70 y.o.  Admit date: 07/02/2017 Discharge date: 07/05/2017  Primary Physician: Dr. Corrinne EagleMarcus Baboff  Primary Discharge Diagnosis:  1.  Non-STEMI  Secondary Discharge Diagnosis: 2.  Fall with ankle fracture 3.  Coronary artery disease with severe stenosis in LAD treated with stenting with stent thrombosis due to edge dissection treated with repeat stenting 4.  Hypertensive heart disease 5.  Hyperlipidemia 6.  History of anxiety and depression  Procedures:  Cardiac catheterization 11/23 with placement of drug-eluting stent to LAD, repeat catheterization later that day due to acute closure with placement of a second stent  Hospital Course: 71 year old male with prior history of hypertension, CAD with previous stenting and angioplasty of the LAD and circumflex in 2001.  He presented to the emergency room after he had several episodes of increasing dyspnea on exertion and vague substernal chest discomfort.  He did not seek medical attention until after Thanksgiving and had a mildly elevated point-of-care at 1.15 and some very nonspecific changes on EKG.  He also had an episode where he fell and some dizziness and twisted his ankle found to have an ankle fracture.  He was taken to the catheterization laboratory by Dr. Excell Seltzerooper the day of admission and had severe proximal LAD stenosis involving tandem lesions of the LAD ostium and severe in-stent restenosis within the remotely placed bare-metal stent and had moderate diffuse circumflex disease and mild RCA stenosis.  He had placement of a 3.0 x 8 mm resolute Onyx stent to the ostium the LAD and a 3.0 x 38 mm resolute stent to the mid LAD.  He developed acute occlusion with an anterior infarction following that procedure later that day and had a resolute 2.5 x 18 mm stent and a 2.25 x 38 mm resolute stent placed in the LAD distally with a good result.     Echocardiogram   showed anterior hypokinesis with an ejection fraction of around 45%.  He was seen by orthopedic surgery who recommended a splint on his right foot.  He had no recurrence of chest discomfort was ambulatory with some difficulty over the weekend.  The etiology of his fall was uncertain and there was a question about whether he should drive or not.  His blood pressure remained elevated and he had additional blood pressure medicines prescribed.  In addition he had Crestor 40 mg added because of significant hyperlipidemia.  He is discharged at this time in improved condition to follow-up with me in 1 week and with orthopedic surgery in 2 weeks.  Discharge Exam: Blood pressure (!) 157/69, pulse (!) 57, temperature 98.1 F (36.7 C), temperature source Oral, resp. rate 17, height 5\' 9"  (1.753 m), weight 90.7 kg (199 lb 15.3 oz), SpO2 99 %. Weight: 90.7 kg (199 lb 15.3 oz)  Lungs clear, normal S1 and S2, no S3 Labs: CBC:   Lab Results  Component Value Date   WBC 7.9 07/03/2017   HGB 12.3 (L) 07/03/2017   HCT 36.1 (L) 07/03/2017   MCV 84.7 07/03/2017   PLT 237 07/03/2017    CMP:  Recent Labs  Lab 07/05/17 0309  NA 136  K 3.9  CL 104  CO2 24  BUN 15  CREATININE 1.12  CALCIUM 8.7*  GLUCOSE 103*    Lipid Panel     Component Value Date/Time   CHOL 219 (H) 07/03/2017 0154   TRIG 272 (H) 07/03/2017 0154   HDL 34 (L) 07/03/2017 0154  CHOLHDL 6.4 07/03/2017 0154   VLDL 54 (H) 07/03/2017 0154   LDLCALC 131 (H) 07/03/2017 0154    Cardiac Enzymes: Recent Labs    07/02/17 1812 07/02/17 2201  TROPONINI 2.11* 3.86*   Hemoglobin A1C: Lab Results  Component Value Date   HGBA1C 5.9 (H) 07/02/2017     Radiology: No acute cardiopulmonary disease  EKG: Initial EKG was normal.  Subsequent EKG following closure of his LAD showed an acute anterior infarction.  Subsequent EKG showed T wave inversions in the anterolateral leads.  Discharge Medications: Allergies as of  07/05/2017      Reactions   Sulfa Antibiotics Hives, Other (See Comments)   Tetanus Toxoids Swelling, Other (See Comments)      Medication List    STOP taking these medications   atorvastatin 40 MG tablet Commonly known as:  LIPITOR   naproxen 500 MG tablet Commonly known as:  NAPROSYN     TAKE these medications   amLODipine 5 MG tablet Commonly known as:  NORVASC Take 5 mg by mouth daily. What changed:  Another medication with the same name was added. Make sure you understand how and when to take each.   amLODipine 10 MG tablet Commonly known as:  NORVASC Take 1 tablet (10 mg total) by mouth daily. Start taking on:  07/06/2017 What changed:  You were already taking a medication with the same name, and this prescription was added. Make sure you understand how and when to take each.   aspirin EC 81 MG tablet Take 81 mg by mouth daily.   buPROPion 150 MG 24 hr tablet Commonly known as:  WELLBUTRIN XL Take 150 mg by mouth every morning.   carvedilol 3.125 MG tablet Commonly known as:  COREG Take 1 tablet (3.125 mg total) by mouth 2 (two) times daily with a meal.   escitalopram 10 MG tablet Commonly known as:  LEXAPRO Take 10 mg by mouth daily. Reported on 02/03/2016   esomeprazole 40 MG capsule Commonly known as:  NEXIUM Take 40 mg by mouth daily before breakfast.   folic acid 800 MCG tablet Commonly known as:  FOLVITE Take 800 mcg by mouth daily. Reported on 02/03/2016   gabapentin 300 MG capsule Commonly known as:  NEURONTIN Take 300 mg by mouth at bedtime.   nitroGLYCERIN 0.4 MG SL tablet Commonly known as:  NITROSTAT Place 0.4 mg under the tongue every 5 (five) minutes as needed.   Oxycodone HCl 20 MG Tabs Take 40-60 mg by mouth every 4 (four) hours as needed (pain).   ramipril 10 MG capsule Commonly known as:  ALTACE Take 10 mg by mouth daily.   rosuvastatin 40 MG tablet Commonly known as:  CRESTOR Take 1 tablet (40 mg total) by mouth daily at 6  PM.   spironolactone 25 MG tablet Commonly known as:  ALDACTONE Take 0.5 tablets (12.5 mg total) by mouth daily. Start taking on:  07/06/2017   ticagrelor 90 MG Tabs tablet Commonly known as:  BRILINTA Take 1 tablet (90 mg total) by mouth 2 (two) times daily.   tiotropium 18 MCG inhalation capsule Commonly known as:  SPIRIVA Place 1 capsule (18 mcg total) into inhaler and inhale daily.   tiZANidine 2 MG tablet Commonly known as:  ZANAFLEX Take 2-4 mg by mouth 4 (four) times daily as needed for muscle spasms.       Followup plans and appointments: Follow-up in 1 week with Dr. Donnie Aho, 2 weeks with orthopedic surgery.  Time spent with  patient to include physician time: 45 minutes  Signed: W. Ashley RoyaltySpencer Valente Fosberg, Jr. MD [facility] 07/05/2017, 10:24 AM

## 2017-07-05 NOTE — Progress Notes (Signed)
Patient and family received discharge information and acknowledged understanding of it. Patient received brilinta card and prescription. RN answered all questions. Patient IV was removed.

## 2017-07-05 NOTE — Progress Notes (Signed)
CARDIAC REHAB PHASE I  Pt in bed, has boot at bedside, however, pt declines ambulation at this time despite encouragement. Pt states "I don't even want to walk to the bathroom." Pt states he has no questions regarding education at this time. Pt in bed, call bell within reach, RN at bedside.   Joylene GrapesEmily C Kartier Bennison, RN, BSN 07/05/2017 9:18 AM

## 2017-07-07 ENCOUNTER — Telehealth (HOSPITAL_COMMUNITY): Payer: Self-pay

## 2017-07-07 NOTE — Telephone Encounter (Signed)
Patients insurance is active and benefits verified through [employer] - No $20 co-pay, no deductible, out of pocket amount of $4,400/$2,341.19 has been met, no co-insurance, and no pre-authorization is required. Passport/reference [phone]  Patient will be contacted and scheduled after their follow up appt with the Cardiologist office upon review by the RN Navigator.

## 2017-07-09 ENCOUNTER — Telehealth (HOSPITAL_COMMUNITY): Payer: Self-pay

## 2017-07-09 NOTE — Telephone Encounter (Signed)
Called Dr.Tilleys office in regards to patients follow up appt - Appt is schedule for 07/13/17.

## 2017-07-26 ENCOUNTER — Encounter: Payer: Medicare Other | Attending: Cardiology | Admitting: *Deleted

## 2017-07-26 DIAGNOSIS — I214 Non-ST elevation (NSTEMI) myocardial infarction: Secondary | ICD-10-CM

## 2017-07-26 NOTE — Progress Notes (Signed)
Incomplete Session Note  Patient Details  Name: Clayton Anderson MRN: [id number] Date of Birth: 08/09/1946 Referring Provider:    [name] did not complete his rehab session.  When [name] arrived he was wearing a walking boot for his ankle fracture.  For safety reasons we cannot allow him to exercise in a boot.  He will be in his boot for at least 5 weeks.  We will reschedule orientation and rehab once cleared by orthopedist.

## 2017-08-19 ENCOUNTER — Telehealth: Payer: Self-pay | Admitting: Cardiovascular Disease

## 2017-08-19 NOTE — Telephone Encounter (Signed)
Patient states he had 2 catheterizations when he was hospitalized 11/23. He has the card from when Dr. Clifton JamesMcAlhany did his catheterization with the locations/types of stents placed. He does not, however, have the card from Dr. Excell Seltzerooper. Informed the patient that it is highly unlikely he can get a new card, but he can get a copy of his catheterization report. He understands Medical Records will call him tomorrow to discuss how to go about obtaining records. He was grateful for call.

## 2017-08-19 NOTE — Telephone Encounter (Signed)
New message  Pt verbalized that he is calling for RN  He did not go into detail

## 2017-08-20 NOTE — Telephone Encounter (Signed)
Left VM for patient. I have records ready for pick up. Release needs to be signed.

## 2018-02-01 ENCOUNTER — Other Ambulatory Visit: Payer: Self-pay | Admitting: Orthopaedic Surgery

## 2018-02-01 DIAGNOSIS — M25571 Pain in right ankle and joints of right foot: Secondary | ICD-10-CM

## 2018-02-14 ENCOUNTER — Ambulatory Visit
Admission: RE | Admit: 2018-02-14 | Discharge: 2018-02-14 | Disposition: A | Discharge status: Home / Self Care | Payer: Medicare Other | Source: Ambulatory Visit | Attending: Orthopaedic Surgery | Admitting: Orthopaedic Surgery

## 2018-02-14 DIAGNOSIS — M25571 Pain in right ankle and joints of right foot: Secondary | ICD-10-CM

## 2018-02-22 ENCOUNTER — Encounter: Payer: Self-pay | Admitting: Cardiology

## 2018-02-22 DIAGNOSIS — I2510 Atherosclerotic heart disease of native coronary artery without angina pectoris: Secondary | ICD-10-CM

## 2018-03-21 ENCOUNTER — Encounter: Payer: Medicare Other | Attending: Cardiology | Admitting: *Deleted

## 2018-03-21 ENCOUNTER — Encounter: Payer: Self-pay | Admitting: *Deleted

## 2018-03-21 VITALS — Ht 70.4 in | Wt 194.8 lb

## 2018-03-21 DIAGNOSIS — Z955 Presence of coronary angioplasty implant and graft: Secondary | ICD-10-CM | POA: Insufficient documentation

## 2018-03-21 DIAGNOSIS — Z48812 Encounter for surgical aftercare following surgery on the circulatory system: Secondary | ICD-10-CM | POA: Diagnosis present

## 2018-03-21 DIAGNOSIS — I214 Non-ST elevation (NSTEMI) myocardial infarction: Secondary | ICD-10-CM

## 2018-03-21 NOTE — Progress Notes (Signed)
Cardiac Individual Treatment Plan  Patient Details  Name: Clayton Anderson MRN: [id number] Date of Birth: 08-27-1945 Referring Provider:     Cardiac Rehab from 03/21/2018 in [facility] Cardiac and Pulmonary Rehab  Referring Provider  Landry Corporal MD      Initial Encounter Date:    Cardiac Rehab from 03/21/2018 in [facility] Cardiac and Pulmonary Rehab  Date  03/21/18      Visit Diagnosis: NSTEMI (non-ST elevated myocardial infarction) [facility])  Status post coronary artery stent placement  Patient's Home Medications on Admission:  Current Outpatient Medications:  .  amLODipine (NORVASC) 10 MG tablet, Take 1 tablet (10 mg total) by mouth daily., Disp: 30 tablet, Rfl: 12 .  aspirin EC 81 MG tablet, Take 81 mg by mouth daily.  , Disp: , Rfl:  .  buPROPion (WELLBUTRIN XL) 150 MG 24 hr tablet, Take 150 mg by mouth every morning., Disp: , Rfl: 1 .  carvedilol (COREG) 3.125 MG tablet, Take 1 tablet (3.125 mg total) by mouth 2 (two) times daily with a meal., Disp: 60 tablet, Rfl: 12 .  clopidogrel (PLAVIX) 75 MG tablet, , Disp: , Rfl:  .  DULoxetine (CYMBALTA) 60 MG capsule, , Disp: , Rfl:  .  escitalopram (LEXAPRO) 10 MG tablet, Take 10 mg by mouth daily. Reported on 02/03/2016, Disp: , Rfl:  .  esomeprazole (NEXIUM) 40 MG capsule, Take 40 mg by mouth daily before breakfast., Disp: , Rfl:  .  folic acid (FOLVITE) 295 MCG tablet, Take 800 mcg by mouth daily. Reported on 02/03/2016, Disp: , Rfl:  .  gabapentin (NEURONTIN) 300 MG capsule, Take 300 mg by mouth at bedtime. , Disp: , Rfl:  .  nitroGLYCERIN (NITROSTAT) 0.4 MG SL tablet, Place 0.4 mg under the tongue every 5 (five) minutes as needed.  , Disp: , Rfl:  .  Oxycodone HCl 20 MG TABS, Take 40-60 mg by mouth every 4 (four) hours as needed (pain)., Disp: , Rfl:  .  ramipril (ALTACE) 10 MG capsule, Take 10 mg by mouth daily.  , Disp: , Rfl:  .  rosuvastatin (CRESTOR) 40 MG tablet, Take 1 tablet (40 mg total) by mouth daily at 6 PM., Disp: 30 tablet, Rfl:  12 .  spironolactone (ALDACTONE) 25 MG tablet, Take 0.5 tablets (12.5 mg total) by mouth daily., Disp: 30 tablet, Rfl: `12 .  tiZANidine (ZANAFLEX) 2 MG tablet, Take 2-4 mg by mouth 4 (four) times daily as needed for muscle spasms., Disp: , Rfl: 0 .  amLODipine (NORVASC) 5 MG tablet, Take 5 mg by mouth daily.  , Disp: , Rfl:  .  ticagrelor (BRILINTA) 90 MG TABS tablet, Take 1 tablet (90 mg total) by mouth 2 (two) times daily. (Patient not taking: Reported on 03/21/2018), Disp: 60 tablet, Rfl: 12 .  tiotropium (SPIRIVA) 18 MCG inhalation capsule, Place 1 capsule (18 mcg total) into inhaler and inhale daily. (Patient not taking: Reported on 03/16/2016), Disp: 30 capsule, Rfl: 6  Past Medical History: Past Medical History:  Diagnosis Date  . Anxiety   . Arthritis   . Baker's cyst of knee    left  . Cervical disc disease 07/20/2011  . Complication of anesthesia    " I wake up in a combative mode"  . Coronary artery disease   . Depression   . Elevated cholesterol   . GERD (gastroesophageal reflux disease)   . Headache(784.0)    years ago  . History of hiatal hernia   . Hypertension   .  Myocardial infarct [facility])    2001  sees Dr. Thurman Coyer  . Neuromuscular disorder (HCC)    hx of shingles/ myofacitis  . Shortness of breath     Tobacco Use: Social History   Tobacco Use  Smoking Status Former Smoker  . Packs/day: 0.90  . Years: 40.00  . Pack years: 36.00  . Types: Cigarettes  . Last attempt to quit: 07/19/2004  . Years since quitting: 13.6  Smokeless Tobacco Never Used    Labs: Recent Review Scientist, physiological    Labs for ITP Cardiac and Pulmonary Rehab Latest Ref Rng & Units 07/20/2011 07/02/2017 07/03/2017   Cholestrol 0 - 200 mg/dL - - 219(H)   LDLCALC 0 - 99 mg/dL - - 131(H)   HDL >40 mg/dL - - 34(L)   Trlycerides <150 mg/dL - - 272(H)   Hemoglobin A1c 4.8 - 5.6 % - 5.9(H) -   TCO2 0 - 100 mmol/L 28 - -       Exercise Target Goals: Exercise Program Goal: Individual  exercise prescription set using results from initial 6 min walk test and THRR while considering  patient's activity barriers and safety.   Exercise Prescription Goal: Initial exercise prescription builds to 30-45 minutes a day of aerobic activity, 2-3 days per week.  Home exercise guidelines will be given to patient during program as part of exercise prescription that the participant will acknowledge.  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 hip pain with walking   Cardiac Risk Stratification  High       6 Minute Walk: 6 Minute Walk    Row Name 03/21/18 1416         6 Minute Walk   Phase  Initial     Distance  1266 feet     Walk Time  6 minutes     # of Rest Breaks  0     MPH  2.4     METS  3.03     RPE  13     Perceived Dyspnea   2     VO2 Peak  10.59     Symptoms  Yes (comment)     Comments  hip pain at end 6/10, SOB     Resting HR  83 bpm     Resting BP  124/64     Resting Oxygen Saturation   96 %     Exercise Oxygen Saturation  during 6 min walk  94 %     Max Ex. HR  96 bpm     Max Ex. BP  156/84     2 Minute Post BP  126/74        Oxygen Initial Assessment:   Oxygen Re-Evaluation:   Oxygen Discharge (Final Oxygen Re-Evaluation):   Initial Exercise Prescription: Initial Exercise Prescription - 03/21/18 1400      Date of Initial Exercise RX and Referring Provider   Date  03/21/18    Referring Provider  Landry Corporal MD      Treadmill   MPH  2.3    Grade  0.5    Minutes  15    METs  2.92      REL-XR   Level  2    Speed  50  Minutes  15    METs  2.9      T5 Nustep   Level  2    SPM  80    Minutes  15     METs  2.9      Prescription Details   Frequency (times per week)  2    Duration  Progress to 30 minutes of continuous aerobic without signs/symptoms of physical distress      Intensity   THRR 40-80% of Max Heartrate  110-137    Ratings of Perceived Exertion  11-13    Perceived Dyspnea  0-4      Progression   Progression  Continue to progress workloads to maintain intensity without signs/symptoms of physical distress.      Resistance Training   Training Prescription  Yes    Weight  4 lbs    Reps  10-15       Perform Capillary Blood Glucose checks as needed.  Exercise Prescription Changes: Exercise Prescription Changes    Row Name 03/21/18 1400             Response to Exercise   Blood Pressure (Admit)  124/64       Blood Pressure (Exercise)  156/84       Blood Pressure (Exit)  126/74       Heart Rate (Admit)  83 bpm       Heart Rate (Exercise)  96 bpm       Heart Rate (Exit)  59 bpm       Oxygen Saturation (Admit)  96 %       Oxygen Saturation (Exercise)  94 %       Rating of Perceived Exertion (Exercise)  13       Perceived Dyspnea (Exercise)  2       Symptoms  hip pain 6/10, SOB       Comments  walk test results          Exercise Comments:   Exercise Goals and Review: Exercise Goals    Row Name 03/21/18 1421             Exercise Goals   Increase Physical Activity  Yes       Intervention  Provide advice, education, support and counseling about physical activity/exercise needs.;Develop an individualized exercise prescription for aerobic and resistive training based on initial evaluation findings, risk stratification, comorbidities and participant's personal goals.       Expected Outcomes  Short Term: Attend rehab on a regular basis to increase amount of physical activity.;Long Term: Add in home exercise to make exercise part of routine and to increase amount of physical activity.;Long Term: Exercising regularly at least 3-5 days a week.       Increase  Strength and Stamina  Yes       Intervention  Provide advice, education, support and counseling about physical activity/exercise needs.;Develop an individualized exercise prescription for aerobic and resistive training based on initial evaluation findings, risk stratification, comorbidities and participant's personal goals.       Expected Outcomes  Short Term: Increase workloads from initial exercise prescription for resistance, speed, and METs.;Long Term: Improve cardiorespiratory fitness, muscular endurance and strength as measured by increased METs and functional capacity (6MWT);Short Term: Perform resistance training exercises routinely during rehab and add in resistance training at home       Able to understand and use rate of perceived exertion (RPE) scale  Yes       Intervention  Provide education  and explanation on how to use RPE scale       Expected Outcomes  Short Term: Able to use RPE daily in rehab to express subjective intensity level;Long Term:  Able to use RPE to guide intensity level when exercising independently       Able to understand and use Dyspnea scale  Yes       Intervention  Provide education and explanation on how to use Dyspnea scale       Expected Outcomes  Short Term: Able to use Dyspnea scale daily in rehab to express subjective sense of shortness of breath during exertion;Long Term: Able to use Dyspnea scale to guide intensity level when exercising independently       Knowledge and understanding of Target Heart Rate Range (THRR)  Yes       Intervention  Provide education and explanation of THRR including how the numbers were predicted and where they are located for reference       Expected Outcomes  Short Term: Able to state/look up THRR;Long Term: Able to use THRR to govern intensity when exercising independently;Short Term: Able to use daily as guideline for intensity in rehab       Able to check pulse independently  Yes       Intervention  Provide education and  demonstration on how to check pulse in carotid and radial arteries.;Review the importance of being able to check your own pulse for safety during independent exercise       Expected Outcomes  Short Term: Able to explain why pulse checking is important during independent exercise;Long Term: Able to check pulse independently and accurately       Understanding of Exercise Prescription  Yes       Intervention  Provide education, explanation, and written materials on patient's individual exercise prescription       Expected Outcomes  Short Term: Able to explain program exercise prescription;Long Term: Able to explain home exercise prescription to exercise independently          Exercise Goals Re-Evaluation :   Discharge Exercise Prescription (Final Exercise Prescription Changes): Exercise Prescription Changes - 03/21/18 1400      Response to Exercise   Blood Pressure (Admit)  124/64    Blood Pressure (Exercise)  156/84    Blood Pressure (Exit)  126/74    Heart Rate (Admit)  83 bpm    Heart Rate (Exercise)  96 bpm    Heart Rate (Exit)  59 bpm    Oxygen Saturation (Admit)  96 %    Oxygen Saturation (Exercise)  94 %    Rating of Perceived Exertion (Exercise)  13    Perceived Dyspnea (Exercise)  2    Symptoms  hip pain 6/10, SOB    Comments  walk test results       Nutrition:  Target Goals: Understanding of nutrition guidelines, daily intake of sodium <1513m, cholesterol <2083m calories 30% from fat and 7% or less from saturated fats, daily to have 5 or more servings of fruits and vegetables.  Biometrics: Pre Biometrics - 03/21/18 1421      Pre Biometrics   Height  5' 10.4" (1.788 m)    Weight  194 lb 12.8 oz (88.4 kg)    Waist Circumference  37 inches    Hip Circumference  41 inches    Waist to Hip Ratio  0.9 %    BMI (Calculated)  27.64    Single Leg Stand  1.89 seconds  Nutrition Therapy Plan and Nutrition Goals:   Nutrition Assessments: Nutrition Assessments -  03/21/18 1357      MEDFICTS Scores   Pre Score  43       Nutrition Goals Re-Evaluation:   Nutrition Goals Discharge (Final Nutrition Goals Re-Evaluation):   Psychosocial: Target Goals: Acknowledge presence or absence of significant depression and/or stress, maximize coping skills, provide positive support system. Participant is able to verbalize types and ability to use techniques and skills needed for reducing stress and depression.   Initial Review & Psychosocial Screening: Initial Psych Review & Screening - 03/21/18 1354      Initial Review   Current issues with  Current Depression;History of Depression;Current Psychotropic Meds;Current Stress Concerns    Source of Stress Concerns  Unable to perform yard/household activities      [place]?  Yes   spouse     Barriers   Psychosocial barriers to participate in program  There are no identifiable barriers or psychosocial needs.;The patient should benefit from training in stress management and relaxation.      Screening Interventions   Interventions  Encouraged to exercise;Program counselor consult;To provide support and resources with identified psychosocial needs;Provide feedback about the scores to participant    Expected Outcomes  Short Term goal: Utilizing psychosocial counselor, staff and physician to assist with identification of specific Stressors or current issues interfering with healing process. Setting desired goal for each stressor or current issue identified.;Long Term Goal: Stressors or current issues are controlled or eliminated.;Short Term goal: Identification and review with participant of any Quality of Life or Depression concerns found by scoring the questionnaire.;Long Term goal: The participant improves quality of Life and PHQ9 Scores as seen by post scores and/or verbalization of changes       Quality of Life Scores:  Quality of Life - 03/21/18 1356      Quality of Life   Select   Quality of Life      Quality of Life Scores   Health/Function Pre  14.73 %    Socioeconomic Pre  21.92 %    Psych/Spiritual Pre  13.33 %    Family Pre  17.3 %    GLOBAL Pre  16.22 %      Scores of 19 and below usually indicate a poorer quality of life in these areas.  A difference of  2-3 points is a clinically meaningful difference.  A difference of 2-3 points in the total score of the Quality of Life Index has been associated with significant improvement in overall quality of life, self-image, physical symptoms, and general health in studies assessing change in quality of life.  PHQ-9: Recent Review Flowsheet Data    Depression screen [facility] 2/9 03/21/2018 03/26/2016 03/16/2016 01/28/2016 12/31/2015   Decreased Interest 2 0 0 0 0   Down, Depressed, Hopeless 1 0 0 0 0   PHQ - 2 Score 3 0 0 0 0   Altered sleeping 0 - - - -   Tired, decreased energy 3 - - - -   Change in appetite 1 - - - -   Feeling bad or failure about yourself  0 - - - -   Trouble concentrating 1 - - - -   Moving slowly or fidgety/restless 1 - - - -   Suicidal thoughts 0 - - - -   PHQ-9 Score 9 - - - -   Difficult doing work/chores Very difficult - - - -  Interpretation of Total Score  Total Score Depression Severity:  1-4 = Minimal depression, 5-9 = Mild depression, 10-14 = Moderate depression, 15-19 = Moderately severe depression, 20-27 = Severe depression   Psychosocial Evaluation and Intervention:   Psychosocial Re-Evaluation:   Psychosocial Discharge (Final Psychosocial Re-Evaluation):   Vocational Rehabilitation: Provide vocational rehab assistance to qualifying candidates.   Vocational Rehab Evaluation & Intervention: Vocational Rehab - 03/21/18 1353      Initial Vocational Rehab Evaluation & Intervention   Assessment shows need for Vocational Rehabilitation  No       Education: Education Goals: Education classes will be provided on a variety of topics geared toward better understanding of  heart health and risk factor modification. Participant will state understanding/return demonstration of topics presented as noted by education test scores.  Learning Barriers/Preferences: Learning Barriers/Preferences - 03/21/18 1351      Learning Barriers/Preferences   Learning Barriers  Hearing;Exercise Concerns;Sight    Learning Preferences  None       Education Topics:  AED/CPR: - Group verbal and written instruction with the use of models to demonstrate the basic use of the AED with the basic ABC's of resuscitation.   General Nutrition Guidelines/Fats and Fiber: -Group instruction provided by verbal, written material, models and posters to present the general guidelines for heart healthy nutrition. Gives an explanation and review of dietary fats and fiber.   Controlling Sodium/Reading Food Labels: -Group verbal and written material supporting the discussion of sodium use in heart healthy nutrition. Review and explanation with models, verbal and written materials for utilization of the food label.   Exercise Physiology & General Exercise Guidelines: - Group verbal and written instruction with models to review the exercise physiology of the cardiovascular system and associated critical values. Provides general exercise guidelines with specific guidelines to those with heart or lung disease.    Aerobic Exercise & Resistance Training: - Gives group verbal and written instruction on the various components of exercise. Focuses on aerobic and resistive training programs and the benefits of this training and how to safely progress through these programs..   Flexibility, Balance, Mind/Body Relaxation: Provides group verbal/written instruction on the benefits of flexibility and balance training, including mind/body exercise modes such as yoga, pilates and tai chi.  Demonstration and skill practice provided.   Stress and Anxiety: - Provides group verbal and written instruction about  the health risks of elevated stress and causes of high stress.  Discuss the correlation between heart/lung disease and anxiety and treatment options. Review healthy ways to manage with stress and anxiety.   Depression: - Provides group verbal and written instruction on the correlation between heart/lung disease and depressed mood, treatment options, and the stigmas associated with seeking treatment.   Anatomy & Physiology of the Heart: - Group verbal and written instruction and models provide basic cardiac anatomy and physiology, with the coronary electrical and arterial systems. Review of Valvular disease and Heart Failure   Cardiac Procedures: - Group verbal and written instruction to review commonly prescribed medications for heart disease. Reviews the medication, class of the drug, and side effects. Includes the steps to properly store meds and maintain the prescription regimen. (beta blockers and nitrates)   Cardiac Medications I: - Group verbal and written instruction to review commonly prescribed medications for heart disease. Reviews the medication, class of the drug, and side effects. Includes the steps to properly store meds and maintain the prescription regimen.   Cardiac Medications II: -Group verbal and written instruction to review  commonly prescribed medications for heart disease. Reviews the medication, class of the drug, and side effects. (all other drug classes)    Go Sex-Intimacy & Heart Disease, Get SMART - Goal Setting: - Group verbal and written instruction through game format to discuss heart disease and the return to sexual intimacy. Provides group verbal and written material to discuss and apply goal setting through the application of the S.M.A.R.T. Method.   Other Matters of the Heart: - Provides group verbal, written materials and models to describe Stable Angina and Peripheral Artery. Includes description of the disease process and treatment options available  to the cardiac patient.   Exercise & Equipment Safety: - Individual verbal instruction and demonstration of equipment use and safety with use of the equipment.   Infection Prevention: - Provides verbal and written material to individual with discussion of infection control including proper hand washing and proper equipment cleaning during exercise session.   Falls Prevention: - Provides verbal and written material to individual with discussion of falls prevention and safety.   Diabetes: - Individual verbal and written instruction to review signs/symptoms of diabetes, desired ranges of glucose level fasting, after meals and with exercise. Acknowledge that pre and post exercise glucose checks will be done for 3 sessions at entry of program.   Know Your Numbers and Risk Factors: -Group verbal and written instruction about important numbers in your health.  Discussion of what are risk factors and how they play a role in the disease process.  Review of Cholesterol, Blood Pressure, Diabetes, and BMI and the role they play in your overall health.   Sleep Hygiene: -Provides group verbal and written instruction about how sleep can affect your health.  Define sleep hygiene, discuss sleep cycles and impact of sleep habits. Review good sleep hygiene tips.    Other: -Provides group and verbal instruction on various topics (see comments)   Knowledge Questionnaire Score: Knowledge Questionnaire Score - 03/21/18 1352      Knowledge Questionnaire Score   Pre Score  23/26   correct answers reviewed with Glendell Docker, focus on Angina, exerice, nutrition      Core Components/Risk Factors/Patient Goals at Admission: Personal Goals and Risk Factors at Admission - 03/21/18 1347      Core Components/Risk Factors/Patient Goals on Admission    Weight Management  Yes;Weight Loss    Intervention  Weight Management: Provide education and appropriate resources to help participant work on and attain dietary  goals.;Weight Management: Develop a combined nutrition and exercise program designed to reach desired caloric intake, while maintaining appropriate intake of nutrient and fiber, sodium and fats, and appropriate energy expenditure required for the weight goal.;Weight Management/Obesity: Establish reasonable short term and long term weight goals.    Admit Weight  194 lb (88 kg)    Goal Weight: Short Term  190 lb (86.2 kg)    Goal Weight: Long Term  180 lb (81.6 kg)    Expected Outcomes  Short Term: Continue to assess and modify interventions until short term weight is achieved;Long Term: Adherence to nutrition and physical activity/exercise program aimed toward attainment of established weight goal;Weight Loss: Understanding of general recommendations for a balanced deficit meal plan, which promotes 1-2 lb weight loss per week and includes a negative energy balance of [phone] kcal/d;Understanding recommendations for meals to include 15-35% energy as protein, 25-35% energy from fat, 35-60% energy from carbohydrates, less than 231m of dietary cholesterol, 20-35 gm of total fiber daily;Understanding of distribution of calorie intake throughout the day with  the consumption of 4-5 meals/snacks    Hypertension  Yes    Intervention  Provide education on lifestyle modifcations including regular physical activity/exercise, weight management, moderate sodium restriction and increased consumption of fresh fruit, vegetables, and low fat dairy, alcohol moderation, and smoking cessation.;Monitor prescription use compliance.    Expected Outcomes  Short Term: Continued assessment and intervention until BP is < 140/93m HG in hypertensive participants. < 130/855mHG in hypertensive participants with diabetes, heart failure or chronic kidney disease.;Long Term: Maintenance of blood pressure at goal levels.    Lipids  Yes    Intervention  Provide education and support for participant on nutrition & aerobic/resistive exercise  along with prescribed medications to achieve LDL <7068mHDL >55m76m  Expected Outcomes  Short Term: Participant states understanding of desired cholesterol values and is compliant with medications prescribed. Participant is following exercise prescription and nutrition guidelines.;Long Term: Cholesterol controlled with medications as prescribed, with individualized exercise RX and with personalized nutrition plan. Value goals: LDL < 70mg69mL > 40 mg.       Core Components/Risk Factors/Patient Goals Review:    Core Components/Risk Factors/Patient Goals at Discharge (Final Review):    ITP Comments: ITP Comments    Row Name 03/21/18 1323           ITP Comments  Med Review completed. Initial ITP created. Diagnosis can be found in [facility]          Comments: Initial ITP

## 2018-03-21 NOTE — Patient Instructions (Signed)
Patient Instructions  Patient Details  Name: Clayton SimsCarl D Snare MRN: [id number] Date of Birth: 08/18/1945 Referring Provider:  Othella Boyerilley, William S, MD  Below are your personal goals for exercise, nutrition, and risk factors. Our goal is to help you stay on track towards obtaining and maintaining these goals. We will be discussing your progress on these goals with you throughout the program.  Initial Exercise Prescription: Initial Exercise Prescription - 03/21/18 1400      Date of Initial Exercise RX and Referring Provider   Date  03/21/18    Referring Provider  Ellwood Handlerilley, William MD      Treadmill   MPH  2.3    Grade  0.5    Minutes  15    METs  2.92      REL-XR   Level  2    Speed  50    Minutes  15    METs  2.9      T5 Nustep   Level  2    SPM  80    Minutes  15    METs  2.9      Prescription Details   Frequency (times per week)  2    Duration  Progress to 30 minutes of continuous aerobic without signs/symptoms of physical distress      Intensity   THRR 40-80% of Max Heartrate  110-137    Ratings of Perceived Exertion  11-13    Perceived Dyspnea  0-4      Progression   Progression  Continue to progress workloads to maintain intensity without signs/symptoms of physical distress.      Resistance Training   Training Prescription  Yes    Weight  4 lbs    Reps  10-15       Exercise Goals: Frequency: Be able to perform aerobic exercise two to three times per week in program working toward 2-5 days per week of home exercise.  Intensity: Work with a perceived exertion of 11 (fairly light) - 15 (hard) while following your exercise prescription.  We will make changes to your prescription with you as you progress through the program.   Duration: Be able to do 30 to 45 minutes of continuous aerobic exercise in addition to a 5 minute warm-up and a 5 minute cool-down routine.   Nutrition Goals: Your personal nutrition goals will be established when you do your nutrition analysis  with the dietician.  The following are general nutrition guidelines to follow: Cholesterol < 200mg /day Sodium < 1500mg /day Fiber: Men over 50 yrs - 30 grams per day  Personal Goals: Personal Goals and Risk Factors at Admission - 03/21/18 1347      Core Components/Risk Factors/Patient Goals on Admission    Weight Management  Yes;Weight Loss    Intervention  Weight Management: Provide education and appropriate resources to help participant work on and attain dietary goals.;Weight Management: Develop a combined nutrition and exercise program designed to reach desired caloric intake, while maintaining appropriate intake of nutrient and fiber, sodium and fats, and appropriate energy expenditure required for the weight goal.;Weight Management/Obesity: Establish reasonable short term and long term weight goals.    Admit Weight  194 lb (88 kg)    Goal Weight: Short Term  190 lb (86.2 kg)    Goal Weight: Long Term  180 lb (81.6 kg)    Expected Outcomes  Short Term: Continue to assess and modify interventions until short term weight is achieved;Long Term: Adherence to nutrition and physical activity/exercise program  aimed toward attainment of established weight goal;Weight Loss: Understanding of general recommendations for a balanced deficit meal plan, which promotes 1-2 lb weight loss per week and includes a negative energy balance of [phone] kcal/d;Understanding recommendations for meals to include 15-35% energy as protein, 25-35% energy from fat, 35-60% energy from carbohydrates, less than 200mg  of dietary cholesterol, 20-35 gm of total fiber daily;Understanding of distribution of calorie intake throughout the day with the consumption of 4-5 meals/snacks    Hypertension  Yes    Intervention  Provide education on lifestyle modifcations including regular physical activity/exercise, weight management, moderate sodium restriction and increased consumption of fresh fruit, vegetables, and low fat dairy,  alcohol moderation, and smoking cessation.;Monitor prescription use compliance.    Expected Outcomes  Short Term: Continued assessment and intervention until BP is < 140/60mm HG in hypertensive participants. < 130/48mm HG in hypertensive participants with diabetes, heart failure or chronic kidney disease.;Long Term: Maintenance of blood pressure at goal levels.    Lipids  Yes    Intervention  Provide education and support for participant on nutrition & aerobic/resistive exercise along with prescribed medications to achieve LDL 70mg , HDL >40mg .    Expected Outcomes  Short Term: Participant states understanding of desired cholesterol values and is compliant with medications prescribed. Participant is following exercise prescription and nutrition guidelines.;Long Term: Cholesterol controlled with medications as prescribed, with individualized exercise RX and with personalized nutrition plan. Value goals: LDL < 70mg , HDL > 40 mg.       Tobacco Use Initial Evaluation: Social History   Tobacco Use  Smoking Status Former Smoker  . Packs/day: 0.90  . Years: 40.00  . Pack years: 36.00  . Types: Cigarettes  . Last attempt to quit: 07/19/2004  . Years since quitting: 13.6  Smokeless Tobacco Never Used    Exercise Goals and Review: Exercise Goals    Row Name 03/21/18 1421             Exercise Goals   Increase Physical Activity  Yes       Intervention  Provide advice, education, support and counseling about physical activity/exercise needs.;Develop an individualized exercise prescription for aerobic and resistive training based on initial evaluation findings, risk stratification, comorbidities and participant's personal goals.       Expected Outcomes  Short Term: Attend rehab on a regular basis to increase amount of physical activity.;Long Term: Add in home exercise to make exercise part of routine and to increase amount of physical activity.;Long Term: Exercising regularly at least 3-5 days a  week.       Increase Strength and Stamina  Yes       Intervention  Provide advice, education, support and counseling about physical activity/exercise needs.;Develop an individualized exercise prescription for aerobic and resistive training based on initial evaluation findings, risk stratification, comorbidities and participant's personal goals.       Expected Outcomes  Short Term: Increase workloads from initial exercise prescription for resistance, speed, and METs.;Long Term: Improve cardiorespiratory fitness, muscular endurance and strength as measured by increased METs and functional capacity ( );Short Term: Perform resistance training exercises routinely during rehab and add in resistance training at home       Able to understand and use rate of perceived exertion (RPE) scale  Yes       Intervention  Provide education and explanation on how to use RPE scale       Expected Outcomes  Short Term: Able to use RPE daily in rehab to express subjective intensity level;Long  Term:  Able to use RPE to guide intensity level when exercising independently       Able to understand and use Dyspnea scale  Yes       Intervention  Provide education and explanation on how to use Dyspnea scale       Expected Outcomes  Short Term: Able to use Dyspnea scale daily in rehab to express subjective sense of shortness of breath during exertion;Long Term: Able to use Dyspnea scale to guide intensity level when exercising independently       Knowledge and understanding of Target Heart Rate Range (THRR)  Yes       Intervention  Provide education and explanation of THRR including how the numbers were predicted and where they are located for reference       Expected Outcomes  Short Term: Able to state/look up THRR;Long Term: Able to use THRR to govern intensity when exercising independently;Short Term: Able to use daily as guideline for intensity in rehab       Able to check pulse independently  Yes       Intervention  Provide  education and demonstration on how to check pulse in carotid and radial arteries.;Review the importance of being able to check your own pulse for safety during independent exercise       Expected Outcomes  Short Term: Able to explain why pulse checking is important during independent exercise;Long Term: Able to check pulse independently and accurately       Understanding of Exercise Prescription  Yes       Intervention  Provide education, explanation, and written materials on patient's individual exercise prescription       Expected Outcomes  Short Term: Able to explain program exercise prescription;Long Term: Able to explain home exercise prescription to exercise independently          Copy of goals given to participant.

## 2018-03-23 ENCOUNTER — Encounter: Payer: Self-pay | Admitting: *Deleted

## 2018-03-23 DIAGNOSIS — I214 Non-ST elevation (NSTEMI) myocardial infarction: Secondary | ICD-10-CM

## 2018-03-23 DIAGNOSIS — Z955 Presence of coronary angioplasty implant and graft: Secondary | ICD-10-CM

## 2018-03-23 NOTE — Progress Notes (Signed)
Cardiac Individual Treatment Plan  Patient Details  Name: Clayton Anderson MRN: [id number] Date of Birth: [birth year] Referring Provider:     Cardiac Rehab from 03/21/2018 in [facility] Cardiac and Pulmonary Rehab  Referring Provider  Landry Corporal MD      Initial Encounter Date:    Cardiac Rehab from 03/21/2018 in [facility] Cardiac and Pulmonary Rehab  Date  03/21/18      Visit Diagnosis: NSTEMI (non-ST elevated myocardial infarction) [facility])  Status post coronary artery stent placement  Patient's Home Medications on Admission:  Current Outpatient Medications:  .  amLODipine (NORVASC) 10 MG tablet, Take 1 tablet (10 mg total) by mouth daily., Disp: 30 tablet, Rfl: 12 .  amLODipine (NORVASC) 5 MG tablet, Take 5 mg by mouth daily.  , Disp: , Rfl:  .  aspirin EC 81 MG tablet, Take 81 mg by mouth daily.  , Disp: , Rfl:  .  buPROPion (WELLBUTRIN XL) 150 MG 24 hr tablet, Take 150 mg by mouth every morning., Disp: , Rfl: 1 .  carvedilol (COREG) 3.125 MG tablet, Take 1 tablet (3.125 mg total) by mouth 2 (two) times daily with a meal., Disp: 60 tablet, Rfl: 12 .  clopidogrel (PLAVIX) 75 MG tablet, , Disp: , Rfl:  .  DULoxetine (CYMBALTA) 60 MG capsule, , Disp: , Rfl:  .  escitalopram (LEXAPRO) 10 MG tablet, Take 10 mg by mouth daily. Reported on 02/03/2016, Disp: , Rfl:  .  esomeprazole (NEXIUM) 40 MG capsule, Take 40 mg by mouth daily before breakfast., Disp: , Rfl:  .  folic acid (FOLVITE) 854 MCG tablet, Take 800 mcg by mouth daily. Reported on 02/03/2016, Disp: , Rfl:  .  gabapentin (NEURONTIN) 300 MG capsule, Take 300 mg by mouth at bedtime. , Disp: , Rfl:  .  nitroGLYCERIN (NITROSTAT) 0.4 MG SL tablet, Place 0.4 mg under the tongue every 5 (five) minutes as needed.  , Disp: , Rfl:  .  Oxycodone HCl 20 MG TABS, Take 40-60 mg by mouth every 4 (four) hours as needed (pain)., Disp: , Rfl:  .  ramipril (ALTACE) 10 MG capsule, Take 10 mg by mouth daily.  , Disp: , Rfl:  .  rosuvastatin (CRESTOR) 40 MG  tablet, Take 1 tablet (40 mg total) by mouth daily at 6 PM., Disp: 30 tablet, Rfl: 12 .  spironolactone (ALDACTONE) 25 MG tablet, Take 0.5 tablets (12.5 mg total) by mouth daily., Disp: 30 tablet, Rfl: `12 .  ticagrelor (BRILINTA) 90 MG TABS tablet, Take 1 tablet (90 mg total) by mouth 2 (two) times daily. (Patient not taking: Reported on 03/21/2018), Disp: 60 tablet, Rfl: 12 .  tiotropium (SPIRIVA) 18 MCG inhalation capsule, Place 1 capsule (18 mcg total) into inhaler and inhale daily. (Patient not taking: Reported on 03/16/2016), Disp: 30 capsule, Rfl: 6 .  tiZANidine (ZANAFLEX) 2 MG tablet, Take 2-4 mg by mouth 4 (four) times daily as needed for muscle spasms., Disp: , Rfl: 0  Past Medical History: Past Medical History:  Diagnosis Date  . Anxiety   . Arthritis   . Baker's cyst of knee    left  . Cervical disc disease 07/20/2011  . Complication of anesthesia    " I wake up in a combative mode"  . Coronary artery disease   . Depression   . Elevated cholesterol   . GERD (gastroesophageal reflux disease)   . Headache(784.0)    years ago  . History of hiatal hernia   . Hypertension   .  Myocardial infarct [facility])    2001  sees Dr. Thurman Coyer  . Neuromuscular disorder (HCC)    hx of shingles/ myofacitis  . Shortness of breath     Tobacco Use: Social History   Tobacco Use  Smoking Status Former Smoker  . Packs/day: 0.90  . Years: 40.00  . Pack years: 36.00  . Types: Cigarettes  . Last attempt to quit: 07/19/2004  . Years since quitting: 13.6  Smokeless Tobacco Never Used    Labs: Recent Review Scientist, physiological    Labs for ITP Cardiac and Pulmonary Rehab Latest Ref Rng & Units 07/20/2011 07/02/2017 07/03/2017   Cholestrol 0 - 200 mg/dL - - 219(H)   LDLCALC 0 - 99 mg/dL - - 131(H)   HDL >40 mg/dL - - 34(L)   Trlycerides <150 mg/dL - - 272(H)   Hemoglobin A1c 4.8 - 5.6 % - 5.9(H) -   TCO2 0 - 100 mmol/L 28 - -       Exercise Target Goals: Exercise Program Goal: Individual  exercise prescription set using results from initial 6 min walk test and THRR while considering  patient's activity barriers and safety.   Exercise Prescription Goal: Initial exercise prescription builds to 30-45 minutes a day of aerobic activity, 2-3 days per week.  Home exercise guidelines will be given to patient during program as part of exercise prescription that the participant will acknowledge.  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 hip pain with walking   Cardiac Risk Stratification  High       6 Minute Walk: 6 Minute Walk    Row Name 03/21/18 1416         6 Minute Walk   Phase  Initial     Distance  1266 feet     Walk Time  6 minutes     # of Rest Breaks  0     MPH  2.4     METS  3.03     RPE  13     Perceived Dyspnea   2     VO2 Peak  10.59     Symptoms  Yes (comment)     Comments  hip pain at end 6/10, SOB     Resting HR  83 bpm     Resting BP  124/64     Resting Oxygen Saturation   96 %     Exercise Oxygen Saturation  during 6 min walk  94 %     Max Ex. HR  96 bpm     Max Ex. BP  156/84     2 Minute Post BP  126/74        Oxygen Initial Assessment:   Oxygen Re-Evaluation:   Oxygen Discharge (Final Oxygen Re-Evaluation):   Initial Exercise Prescription: Initial Exercise Prescription - 03/21/18 1400      Date of Initial Exercise RX and Referring Provider   Date  03/21/18    Referring Provider  Landry Corporal MD      Treadmill   MPH  2.3    Grade  0.5    Minutes  15    METs  2.92      REL-XR   Level  2    Speed  50  Minutes  15    METs  2.9      T5 Nustep   Level  2    SPM  80    Minutes  15     METs  2.9      Prescription Details   Frequency (times per week)  2    Duration  Progress to 30 minutes of continuous aerobic without signs/symptoms of physical distress      Intensity   THRR 40-80% of Max Heartrate  110-137    Ratings of Perceived Exertion  11-13    Perceived Dyspnea  0-4      Progression   Progression  Continue to progress workloads to maintain intensity without signs/symptoms of physical distress.      Resistance Training   Training Prescription  Yes    Weight  4 lbs    Reps  10-15       Perform Capillary Blood Glucose checks as needed.  Exercise Prescription Changes: Exercise Prescription Changes    Row Name 03/21/18 1400             Response to Exercise   Blood Pressure (Admit)  124/64       Blood Pressure (Exercise)  156/84       Blood Pressure (Exit)  126/74       Heart Rate (Admit)  83 bpm       Heart Rate (Exercise)  96 bpm       Heart Rate (Exit)  59 bpm       Oxygen Saturation (Admit)  96 %       Oxygen Saturation (Exercise)  94 %       Rating of Perceived Exertion (Exercise)  13       Perceived Dyspnea (Exercise)  2       Symptoms  hip pain 6/10, SOB       Comments  walk test results          Exercise Comments:   Exercise Goals and Review: Exercise Goals    Row Name 03/21/18 1421             Exercise Goals   Increase Physical Activity  Yes       Intervention  Provide advice, education, support and counseling about physical activity/exercise needs.;Develop an individualized exercise prescription for aerobic and resistive training based on initial evaluation findings, risk stratification, comorbidities and participant's personal goals.       Expected Outcomes  Short Term: Attend rehab on a regular basis to increase amount of physical activity.;Long Term: Add in home exercise to make exercise part of routine and to increase amount of physical activity.;Long Term: Exercising regularly at least 3-5 days a week.       Increase  Strength and Stamina  Yes       Intervention  Provide advice, education, support and counseling about physical activity/exercise needs.;Develop an individualized exercise prescription for aerobic and resistive training based on initial evaluation findings, risk stratification, comorbidities and participant's personal goals.       Expected Outcomes  Short Term: Increase workloads from initial exercise prescription for resistance, speed, and METs.;Long Term: Improve cardiorespiratory fitness, muscular endurance and strength as measured by increased METs and functional capacity (6MWT);Short Term: Perform resistance training exercises routinely during rehab and add in resistance training at home       Able to understand and use rate of perceived exertion (RPE) scale  Yes       Intervention  Provide education  and explanation on how to use RPE scale       Expected Outcomes  Short Term: Able to use RPE daily in rehab to express subjective intensity level;Long Term:  Able to use RPE to guide intensity level when exercising independently       Able to understand and use Dyspnea scale  Yes       Intervention  Provide education and explanation on how to use Dyspnea scale       Expected Outcomes  Short Term: Able to use Dyspnea scale daily in rehab to express subjective sense of shortness of breath during exertion;Long Term: Able to use Dyspnea scale to guide intensity level when exercising independently       Knowledge and understanding of Target Heart Rate Range (THRR)  Yes       Intervention  Provide education and explanation of THRR including how the numbers were predicted and where they are located for reference       Expected Outcomes  Short Term: Able to state/look up THRR;Long Term: Able to use THRR to govern intensity when exercising independently;Short Term: Able to use daily as guideline for intensity in rehab       Able to check pulse independently  Yes       Intervention  Provide education and  demonstration on how to check pulse in carotid and radial arteries.;Review the importance of being able to check your own pulse for safety during independent exercise       Expected Outcomes  Short Term: Able to explain why pulse checking is important during independent exercise;Long Term: Able to check pulse independently and accurately       Understanding of Exercise Prescription  Yes       Intervention  Provide education, explanation, and written materials on patient's individual exercise prescription       Expected Outcomes  Short Term: Able to explain program exercise prescription;Long Term: Able to explain home exercise prescription to exercise independently          Exercise Goals Re-Evaluation :   Discharge Exercise Prescription (Final Exercise Prescription Changes): Exercise Prescription Changes - 03/21/18 1400      Response to Exercise   Blood Pressure (Admit)  124/64    Blood Pressure (Exercise)  156/84    Blood Pressure (Exit)  126/74    Heart Rate (Admit)  83 bpm    Heart Rate (Exercise)  96 bpm    Heart Rate (Exit)  59 bpm    Oxygen Saturation (Admit)  96 %    Oxygen Saturation (Exercise)  94 %    Rating of Perceived Exertion (Exercise)  13    Perceived Dyspnea (Exercise)  2    Symptoms  hip pain 6/10, SOB    Comments  walk test results       Nutrition:  Target Goals: Understanding of nutrition guidelines, daily intake of sodium <1513m, cholesterol <2083m calories 30% from fat and 7% or less from saturated fats, daily to have 5 or more servings of fruits and vegetables.  Biometrics: Pre Biometrics - 03/21/18 1421      Pre Biometrics   Height  5' 10.4" (1.788 m)    Weight  194 lb 12.8 oz (88.4 kg)    Waist Circumference  37 inches    Hip Circumference  41 inches    Waist to Hip Ratio  0.9 %    BMI (Calculated)  27.64    Single Leg Stand  1.89 seconds  Nutrition Therapy Plan and Nutrition Goals:   Nutrition Assessments: Nutrition Assessments -  03/21/18 1357      MEDFICTS Scores   Pre Score  43       Nutrition Goals Re-Evaluation:   Nutrition Goals Discharge (Final Nutrition Goals Re-Evaluation):   Psychosocial: Target Goals: Acknowledge presence or absence of significant depression and/or stress, maximize coping skills, provide positive support system. Participant is able to verbalize types and ability to use techniques and skills needed for reducing stress and depression.   Initial Review & Psychosocial Screening: Initial Psych Review & Screening - 03/21/18 1354      Initial Review   Current issues with  Current Depression;History of Depression;Current Psychotropic Meds;Current Stress Concerns    Source of Stress Concerns  Unable to perform yard/household activities      [place]?  Yes   spouse     Barriers   Psychosocial barriers to participate in program  There are no identifiable barriers or psychosocial needs.;The patient should benefit from training in stress management and relaxation.      Screening Interventions   Interventions  Encouraged to exercise;Program counselor consult;To provide support and resources with identified psychosocial needs;Provide feedback about the scores to participant    Expected Outcomes  Short Term goal: Utilizing psychosocial counselor, staff and physician to assist with identification of specific Stressors or current issues interfering with healing process. Setting desired goal for each stressor or current issue identified.;Long Term Goal: Stressors or current issues are controlled or eliminated.;Short Term goal: Identification and review with participant of any Quality of Life or Depression concerns found by scoring the questionnaire.;Long Term goal: The participant improves quality of Life and PHQ9 Scores as seen by post scores and/or verbalization of changes       Quality of Life Scores:  Quality of Life - 03/21/18 1356      Quality of Life   Select   Quality of Life      Quality of Life Scores   Health/Function Pre  14.73 %    Socioeconomic Pre  21.92 %    Psych/Spiritual Pre  13.33 %    Family Pre  17.3 %    GLOBAL Pre  16.22 %      Scores of 19 and below usually indicate a poorer quality of life in these areas.  A difference of  2-3 points is a clinically meaningful difference.  A difference of 2-3 points in the total score of the Quality of Life Index has been associated with significant improvement in overall quality of life, self-image, physical symptoms, and general health in studies assessing change in quality of life.  PHQ-9: Recent Review Flowsheet Data    Depression screen [facility] 2/9 03/21/2018 03/26/2016 03/16/2016 01/28/2016 12/31/2015   Decreased Interest 2 0 0 0 0   Down, Depressed, Hopeless 1 0 0 0 0   PHQ - 2 Score 3 0 0 0 0   Altered sleeping 0 - - - -   Tired, decreased energy 3 - - - -   Change in appetite 1 - - - -   Feeling bad or failure about yourself  0 - - - -   Trouble concentrating 1 - - - -   Moving slowly or fidgety/restless 1 - - - -   Suicidal thoughts 0 - - - -   PHQ-9 Score 9 - - - -   Difficult doing work/chores Very difficult - - - -  Interpretation of Total Score  Total Score Depression Severity:  1-4 = Minimal depression, 5-9 = Mild depression, 10-14 = Moderate depression, 15-19 = Moderately severe depression, 20-27 = Severe depression   Psychosocial Evaluation and Intervention:   Psychosocial Re-Evaluation:   Psychosocial Discharge (Final Psychosocial Re-Evaluation):   Vocational Rehabilitation: Provide vocational rehab assistance to qualifying candidates.   Vocational Rehab Evaluation & Intervention: Vocational Rehab - 03/21/18 1353      Initial Vocational Rehab Evaluation & Intervention   Assessment shows need for Vocational Rehabilitation  No       Education: Education Goals: Education classes will be provided on a variety of topics geared toward better understanding of  heart health and risk factor modification. Participant will state understanding/return demonstration of topics presented as noted by education test scores.  Learning Barriers/Preferences: Learning Barriers/Preferences - 03/21/18 1351      Learning Barriers/Preferences   Learning Barriers  Hearing;Exercise Concerns;Sight    Learning Preferences  None       Education Topics:  AED/CPR: - Group verbal and written instruction with the use of models to demonstrate the basic use of the AED with the basic ABC's of resuscitation.   General Nutrition Guidelines/Fats and Fiber: -Group instruction provided by verbal, written material, models and posters to present the general guidelines for heart healthy nutrition. Gives an explanation and review of dietary fats and fiber.   Controlling Sodium/Reading Food Labels: -Group verbal and written material supporting the discussion of sodium use in heart healthy nutrition. Review and explanation with models, verbal and written materials for utilization of the food label.   Exercise Physiology & General Exercise Guidelines: - Group verbal and written instruction with models to review the exercise physiology of the cardiovascular system and associated critical values. Provides general exercise guidelines with specific guidelines to those with heart or lung disease.    Aerobic Exercise & Resistance Training: - Gives group verbal and written instruction on the various components of exercise. Focuses on aerobic and resistive training programs and the benefits of this training and how to safely progress through these programs..   Flexibility, Balance, Mind/Body Relaxation: Provides group verbal/written instruction on the benefits of flexibility and balance training, including mind/body exercise modes such as yoga, pilates and tai chi.  Demonstration and skill practice provided.   Stress and Anxiety: - Provides group verbal and written instruction about  the health risks of elevated stress and causes of high stress.  Discuss the correlation between heart/lung disease and anxiety and treatment options. Review healthy ways to manage with stress and anxiety.   Depression: - Provides group verbal and written instruction on the correlation between heart/lung disease and depressed mood, treatment options, and the stigmas associated with seeking treatment.   Anatomy & Physiology of the Heart: - Group verbal and written instruction and models provide basic cardiac anatomy and physiology, with the coronary electrical and arterial systems. Review of Valvular disease and Heart Failure   Cardiac Procedures: - Group verbal and written instruction to review commonly prescribed medications for heart disease. Reviews the medication, class of the drug, and side effects. Includes the steps to properly store meds and maintain the prescription regimen. (beta blockers and nitrates)   Cardiac Medications I: - Group verbal and written instruction to review commonly prescribed medications for heart disease. Reviews the medication, class of the drug, and side effects. Includes the steps to properly store meds and maintain the prescription regimen.   Cardiac Medications II: -Group verbal and written instruction to review  commonly prescribed medications for heart disease. Reviews the medication, class of the drug, and side effects. (all other drug classes)    Go Sex-Intimacy & Heart Disease, Get SMART - Goal Setting: - Group verbal and written instruction through game format to discuss heart disease and the return to sexual intimacy. Provides group verbal and written material to discuss and apply goal setting through the application of the S.M.A.R.T. Method.   Other Matters of the Heart: - Provides group verbal, written materials and models to describe Stable Angina and Peripheral Artery. Includes description of the disease process and treatment options available  to the cardiac patient.   Exercise & Equipment Safety: - Individual verbal instruction and demonstration of equipment use and safety with use of the equipment.   Infection Prevention: - Provides verbal and written material to individual with discussion of infection control including proper hand washing and proper equipment cleaning during exercise session.   Falls Prevention: - Provides verbal and written material to individual with discussion of falls prevention and safety.   Diabetes: - Individual verbal and written instruction to review signs/symptoms of diabetes, desired ranges of glucose level fasting, after meals and with exercise. Acknowledge that pre and post exercise glucose checks will be done for 3 sessions at entry of program.   Know Your Numbers and Risk Factors: -Group verbal and written instruction about important numbers in your health.  Discussion of what are risk factors and how they play a role in the disease process.  Review of Cholesterol, Blood Pressure, Diabetes, and BMI and the role they play in your overall health.   Sleep Hygiene: -Provides group verbal and written instruction about how sleep can affect your health.  Define sleep hygiene, discuss sleep cycles and impact of sleep habits. Review good sleep hygiene tips.    Other: -Provides group and verbal instruction on various topics (see comments)   Knowledge Questionnaire Score: Knowledge Questionnaire Score - 03/21/18 1352      Knowledge Questionnaire Score   Pre Score  23/26   correct answers reviewed with Glendell Docker, focus on Angina, exerice, nutrition      Core Components/Risk Factors/Patient Goals at Admission: Personal Goals and Risk Factors at Admission - 03/21/18 1347      Core Components/Risk Factors/Patient Goals on Admission    Weight Management  Yes;Weight Loss    Intervention  Weight Management: Provide education and appropriate resources to help participant work on and attain dietary  goals.;Weight Management: Develop a combined nutrition and exercise program designed to reach desired caloric intake, while maintaining appropriate intake of nutrient and fiber, sodium and fats, and appropriate energy expenditure required for the weight goal.;Weight Management/Obesity: Establish reasonable short term and long term weight goals.    Admit Weight  194 lb (88 kg)    Goal Weight: Short Term  190 lb (86.2 kg)    Goal Weight: Long Term  180 lb (81.6 kg)    Expected Outcomes  Short Term: Continue to assess and modify interventions until short term weight is achieved;Long Term: Adherence to nutrition and physical activity/exercise program aimed toward attainment of established weight goal;Weight Loss: Understanding of general recommendations for a balanced deficit meal plan, which promotes 1-2 lb weight loss per week and includes a negative energy balance of [phone] kcal/d;Understanding recommendations for meals to include 15-35% energy as protein, 25-35% energy from fat, 35-60% energy from carbohydrates, less than 231m of dietary cholesterol, 20-35 gm of total fiber daily;Understanding of distribution of calorie intake throughout the day with  the consumption of 4-5 meals/snacks    Hypertension  Yes    Intervention  Provide education on lifestyle modifcations including regular physical activity/exercise, weight management, moderate sodium restriction and increased consumption of fresh fruit, vegetables, and low fat dairy, alcohol moderation, and smoking cessation.;Monitor prescription use compliance.    Expected Outcomes  Short Term: Continued assessment and intervention until BP is < 140/84m HG in hypertensive participants. < 130/848mHG in hypertensive participants with diabetes, heart failure or chronic kidney disease.;Long Term: Maintenance of blood pressure at goal levels.    Lipids  Yes    Intervention  Provide education and support for participant on nutrition & aerobic/resistive exercise  along with prescribed medications to achieve LDL '70mg'$ , HDL >'40mg'$ .    Expected Outcomes  Short Term: Participant states understanding of desired cholesterol values and is compliant with medications prescribed. Participant is following exercise prescription and nutrition guidelines.;Long Term: Cholesterol controlled with medications as prescribed, with individualized exercise RX and with personalized nutrition plan. Value goals: LDL < '70mg'$ , HDL > 40 mg.       Core Components/Risk Factors/Patient Goals Review:    Core Components/Risk Factors/Patient Goals at Discharge (Final Review):    ITP Comments: ITP Comments    Row Name 03/21/18 1323 03/23/18 0903         ITP Comments  Med Review completed. Initial ITP created. [facility]  30 day review completed. ITP sent to Dr. BeRamonita Labcovering for Dr. MaEmily FilbertMedical Director of Cardiac Rehab. Continue with ITP unless changes are made by physician.  New to program.  Has not started exercise yet         Comments: 30 day review

## 2018-03-29 ENCOUNTER — Encounter: Payer: Self-pay | Admitting: Dietician

## 2018-03-29 ENCOUNTER — Encounter: Payer: Medicare Other | Admitting: *Deleted

## 2018-03-29 DIAGNOSIS — Z955 Presence of coronary angioplasty implant and graft: Secondary | ICD-10-CM

## 2018-03-29 DIAGNOSIS — I214 Non-ST elevation (NSTEMI) myocardial infarction: Secondary | ICD-10-CM

## 2018-03-29 DIAGNOSIS — Z48812 Encounter for surgical aftercare following surgery on the circulatory system: Secondary | ICD-10-CM | POA: Diagnosis not present

## 2018-03-29 NOTE — Progress Notes (Signed)
Daily Session Note  Patient Details  Name: Clayton Anderson MRN: [id number] Date of Birth: [birth year] Referring Provider:     Cardiac Rehab from 03/21/2018 in [facility] Cardiac and Pulmonary Rehab  Referring Provider  Landry Corporal MD      Encounter Date: 03/29/2018  Check In: Session Check In - 03/29/18 0926      Check-In   Supervising physician immediately available to respond to emergencies  See telemetry face sheet for immediately available ER MD    Location  [facility]-Cardiac & Pulmonary Rehab    Staff Present  Joellyn Rued, BS, PEC;Krista Dover, RN BSN;Jessica [place], [place], RCEP, CCRP, Exercise Physiologist    Medication changes reported      No    Fall or balance concerns reported     No    Warm-up and Cool-down  Performed on first and last piece of equipment    Resistance Training Performed  Yes    VAD Patient?  No    PAD/SET Patient?  No      Pain Assessment   Currently in Pain?  No/denies          Social History   Tobacco Use  Smoking Status Former Smoker  . Packs/day: 0.90  . Years: 40.00  . Pack years: 36.00  . Types: Cigarettes  . Last attempt to quit: 07/19/2004  . Years since quitting: 13.7  Smokeless Tobacco Never Used    Goals Met:  Exercise tolerated well No report of cardiac concerns or symptoms Strength training completed today  Goals Unmet:  Not Applicable  Comments: First full day of exercise!  Patient was oriented to gym and equipment including functions, settings, policies, and procedures.  Patient's individual exercise prescription and treatment plan were reviewed.  All starting workloads were established based on the results of the 6 minute walk test done at initial orientation visit.  The plan for exercise progression was also introduced and progression will be customized based on patient's performance and goals.     Dr. Emily Filbert is Medical Director for [place] and [facility].

## 2018-03-31 ENCOUNTER — Encounter: Payer: Medicare Other | Admitting: *Deleted

## 2018-03-31 DIAGNOSIS — I214 Non-ST elevation (NSTEMI) myocardial infarction: Secondary | ICD-10-CM

## 2018-03-31 DIAGNOSIS — Z955 Presence of coronary angioplasty implant and graft: Secondary | ICD-10-CM

## 2018-03-31 DIAGNOSIS — Z48812 Encounter for surgical aftercare following surgery on the circulatory system: Secondary | ICD-10-CM | POA: Diagnosis not present

## 2018-03-31 NOTE — Progress Notes (Signed)
Daily Session Note  Patient Details  Name: Clayton Anderson MRN: [id number] Date of Birth: [birth year] Referring Provider:     Cardiac Rehab from 03/21/2018 in [facility] Cardiac and Pulmonary Rehab  Referring Provider  Landry Corporal MD      Encounter Date: 03/31/2018  Check In: Session Check In - 03/31/18 0923      Check-In   Supervising physician immediately available to respond to emergencies  See telemetry face sheet for immediately available ER MD    Location  [facility]-Cardiac & Pulmonary Rehab    Staff Present  Joellyn Rued, BS, PEC;Carroll Enterkin, RN, BSN;Bijou Easler [place], MA, RCEP, CCRP, Exercise Physiologist;Joseph Tessie Fass RCP,RRT,BSRT    Medication changes reported      No    Fall or balance concerns reported     No    Warm-up and Cool-down  Performed on first and last piece of equipment    Resistance Training Performed  Yes    VAD Patient?  No    PAD/SET Patient?  No      Pain Assessment   Currently in Pain?  No/denies          Social History   Tobacco Use  Smoking Status Former Smoker  . Packs/day: 0.90  . Years: 40.00  . Pack years: 36.00  . Types: Cigarettes  . Last attempt to quit: 07/19/2004  . Years since quitting: 13.7  Smokeless Tobacco Never Used    Goals Met:  [place] with exercise equipment Exercise tolerated well No report of cardiac concerns or symptoms Strength training completed today  Goals Unmet:  Not Applicable  Comments: Pt able to follow exercise prescription today without complaint.  Will continue to monitor for progression.    Dr. Emily Filbert is Medical Director for [place] and [facility].

## 2018-04-05 ENCOUNTER — Encounter: Payer: Medicare Other | Admitting: *Deleted

## 2018-04-05 DIAGNOSIS — Z48812 Encounter for surgical aftercare following surgery on the circulatory system: Secondary | ICD-10-CM | POA: Diagnosis not present

## 2018-04-05 DIAGNOSIS — Z955 Presence of coronary angioplasty implant and graft: Secondary | ICD-10-CM

## 2018-04-05 DIAGNOSIS — I214 Non-ST elevation (NSTEMI) myocardial infarction: Secondary | ICD-10-CM

## 2018-04-05 NOTE — Progress Notes (Signed)
Daily Session Note  Patient Details  Name: Clayton Anderson MRN: [id number] Date of Birth: 1945-10-23 Referring Provider:     Cardiac Rehab from 03/21/2018 in [facility] Cardiac and Pulmonary Rehab  Referring Provider  Landry Corporal MD      Encounter Date: 04/05/2018  Check In: Session Check In - 04/05/18 0930      Check-In   Supervising physician immediately available to respond to emergencies  See telemetry face sheet for immediately available ER MD    Location  [facility]-Cardiac & Pulmonary Rehab    Staff Present  Joellyn Rued, BS, PEC;Susanne Bice, RN, BSN, CCRP;Eveleen Mcnear [place], MA, RCEP, CCRP, Exercise Physiologist;Amanda Oletta Darter, BA, ACSM CEP, Exercise Physiologist    Medication changes reported      No    Fall or balance concerns reported     No    Warm-up and Cool-down  Performed on first and last piece of equipment    Resistance Training Performed  Yes    VAD Patient?  No    PAD/SET Patient?  No      Pain Assessment   Currently in Pain?  No/denies          Social History   Tobacco Use  Smoking Status Former Smoker  . Packs/day: 0.90  . Years: 40.00  . Pack years: 36.00  . Types: Cigarettes  . Last attempt to quit: 07/19/2004  . Years since quitting: 13.7  Smokeless Tobacco Never Used    Goals Met:  [place] with exercise equipment Exercise tolerated well No report of cardiac concerns or symptoms Strength training completed today  Goals Unmet:  Not Applicable  Comments: Pt able to follow exercise prescription today without complaint.  Will continue to monitor for progression.    Dr. Emily Filbert is Medical Director for [place] and [facility].

## 2018-04-07 ENCOUNTER — Encounter: Payer: Medicare Other | Admitting: *Deleted

## 2018-04-07 DIAGNOSIS — I214 Non-ST elevation (NSTEMI) myocardial infarction: Secondary | ICD-10-CM

## 2018-04-07 DIAGNOSIS — Z48812 Encounter for surgical aftercare following surgery on the circulatory system: Secondary | ICD-10-CM | POA: Diagnosis not present

## 2018-04-07 DIAGNOSIS — Z955 Presence of coronary angioplasty implant and graft: Secondary | ICD-10-CM

## 2018-04-07 NOTE — Progress Notes (Signed)
Daily Session Note  Patient Details  Name: Clayton Anderson MRN: [id number] Date of Birth: 03-05-1946 Referring Provider:     Cardiac Rehab from 03/21/2018 in [facility] Cardiac and Pulmonary Rehab  Referring Provider  Landry Corporal MD      Encounter Date: 04/07/2018  Check In: Session Check In - 04/07/18 0911      Check-In   Supervising physician immediately available to respond to emergencies  See telemetry face sheet for immediately available ER MD    Location  [facility]-Cardiac & Pulmonary Rehab    Staff Present  Joellyn Rued, BS, PEC;Carroll Enterkin, RN, BSN;Joseph Hood [place], [place], [place], [place], Exercise Physiologist    Medication changes reported      No    Fall or balance concerns reported     No    Warm-up and Cool-down  Performed on first and last piece of equipment    Resistance Training Performed  Yes    VAD Patient?  No    PAD/SET Patient?  No      Pain Assessment   Currently in Pain?  No/denies          Social History   Tobacco Use  Smoking Status Former Smoker  . Packs/day: 0.90  . Years: 40.00  . Pack years: 36.00  . Types: Cigarettes  . Last attempt to quit: 07/19/2004  . Years since quitting: 13.7  Smokeless Tobacco Never Used    Goals Met:  [place] with exercise equipment Exercise tolerated well Personal goals reviewed No report of cardiac concerns or symptoms Strength training completed today  Goals Unmet:  Not Applicable  Comments: Pt able to follow exercise prescription today without complaint.  Will continue to monitor for progression.  Reviewed home exercise with pt today.  Pt plans to walk at home for exercise. 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 Reviewed THR, pulse, RPE, sign and symptoms, NTG use, and when to call 911 or MD.  Also discussed weather considerations and indoor options.  Pt voiced understanding.   Dr. Emily Filbert is Medical Director for  [place] and [facility].

## 2018-04-12 ENCOUNTER — Encounter: Payer: Medicare Other | Attending: Cardiology | Admitting: *Deleted

## 2018-04-12 DIAGNOSIS — Z955 Presence of coronary angioplasty implant and graft: Secondary | ICD-10-CM | POA: Insufficient documentation

## 2018-04-12 DIAGNOSIS — Z48812 Encounter for surgical aftercare following surgery on the circulatory system: Secondary | ICD-10-CM | POA: Diagnosis present

## 2018-04-12 DIAGNOSIS — I214 Non-ST elevation (NSTEMI) myocardial infarction: Secondary | ICD-10-CM

## 2018-04-12 NOTE — Progress Notes (Signed)
Daily Session Note  Patient Details  Name: Clayton Anderson MRN: [id number] Date of Birth: 01-30-1946 Referring Provider:     Cardiac Rehab from 03/21/2018 in [facility] Cardiac and Pulmonary Rehab  Referring Provider  Landry Corporal MD      Encounter Date: 04/12/2018  Check In: Session Check In - 04/12/18 0912      Check-In   Supervising physician immediately available to respond to emergencies  See telemetry face sheet for immediately available ER MD    Staff Present  Nyoka Cowden, RN, BSN, [place], MA, RCEP, CCRP, Exercise Physiologist;Amanda Oletta Darter, [place], ACSM CEP, Exercise Physiologist    Medication changes reported      No    Fall or balance concerns reported     No    Warm-up and Cool-down  Performed on first and last piece of equipment    Resistance Training Performed  Yes    VAD Patient?  No    PAD/SET Patient?  No      Pain Assessment   Currently in Pain?  No/denies          Social History   Tobacco Use  Smoking Status Former Smoker  . Packs/day: 0.90  . Years: 40.00  . Pack years: 36.00  . Types: Cigarettes  . Last attempt to quit: 07/19/2004  . Years since quitting: 13.7  Smokeless Tobacco Never Used    Goals Met:  [place] with exercise equipment Exercise tolerated well No report of cardiac concerns or symptoms Strength training completed today  Goals Unmet:  Not Applicable  Comments: Pt able to follow exercise prescription today without complaint.  Will continue to monitor for progression.    Dr. Emily Filbert is Medical Director for [place] and [facility].

## 2018-04-14 DIAGNOSIS — Z48812 Encounter for surgical aftercare following surgery on the circulatory system: Secondary | ICD-10-CM | POA: Diagnosis not present

## 2018-04-14 DIAGNOSIS — I214 Non-ST elevation (NSTEMI) myocardial infarction: Secondary | ICD-10-CM

## 2018-04-14 NOTE — Progress Notes (Signed)
Daily Session Note  Patient Details  Name: Clayton Anderson MRN: [id number] Date of Birth: [birth year] Referring Provider:     Cardiac Rehab from 03/21/2018 in [facility] Cardiac and Pulmonary Rehab  Referring Provider  Landry Corporal MD      Encounter Date: 04/14/2018  Check In:      Social History   Tobacco Use  Smoking Status Former Smoker  . Packs/day: 0.90  . Years: 40.00  . Pack years: 36.00  . Types: Cigarettes  . Last attempt to quit: 07/19/2004  . Years since quitting: 13.7  Smokeless Tobacco Never Used    Goals Met:  [place] with exercise equipment Exercise tolerated well No report of cardiac concerns or symptoms Strength training completed today  Goals Unmet:  Not Applicable  Comments: Pt able to follow exercise prescription today without complaint.  Will continue to monitor for progression.   Dr. Emily Filbert is Medical Director for [place] and [facility].

## 2018-04-19 ENCOUNTER — Encounter: Payer: Medicare Other | Admitting: *Deleted

## 2018-04-19 DIAGNOSIS — I214 Non-ST elevation (NSTEMI) myocardial infarction: Secondary | ICD-10-CM

## 2018-04-19 DIAGNOSIS — Z955 Presence of coronary angioplasty implant and graft: Secondary | ICD-10-CM

## 2018-04-19 DIAGNOSIS — Z48812 Encounter for surgical aftercare following surgery on the circulatory system: Secondary | ICD-10-CM | POA: Diagnosis not present

## 2018-04-19 NOTE — Progress Notes (Signed)
Daily Session Note  Patient Details  Name: Clayton Anderson MRN: [id number] Date of Birth: 1945-09-09 Referring Provider:     Cardiac Rehab from 03/21/2018 in [facility] Cardiac and Pulmonary Rehab  Referring Provider  Landry Corporal MD      Encounter Date: 04/19/2018  Check In: Session Check In - 04/19/18 0929      Check-In   Supervising physician immediately available to respond to emergencies  See telemetry face sheet for immediately available ER MD    Location  [facility]-Cardiac & Pulmonary Rehab    Staff Present  Alberteen Sam, MA, RCEP, CCRP, Exercise Physiologist;Amanda Oletta Darter, BA, ACSM CEP, Exercise Physiologist;Mary Kellie Shropshire, RN, BSN, MA    Medication changes reported      No    Fall or balance concerns reported     No    Warm-up and Cool-down  Performed on first and last piece of equipment    Resistance Training Performed  Yes    VAD Patient?  No    PAD/SET Patient?  No      Pain Assessment   Currently in Pain?  No/denies          Social History   Tobacco Use  Smoking Status Former Smoker  . Packs/day: 0.90  . Years: 40.00  . Pack years: 36.00  . Types: Cigarettes  . Last attempt to quit: 07/19/2004  . Years since quitting: 13.7  Smokeless Tobacco Never Used    Goals Met:  [place] with exercise equipment Exercise tolerated well No report of cardiac concerns or symptoms Strength training completed today  Goals Unmet:  Not Applicable  Comments: Pt able to follow exercise prescription today without complaint.  Will continue to monitor for progression.    Dr. Emily Filbert is Medical Director for [place] and [facility].

## 2018-04-20 ENCOUNTER — Encounter: Payer: Self-pay | Admitting: *Deleted

## 2018-04-20 DIAGNOSIS — I214 Non-ST elevation (NSTEMI) myocardial infarction: Secondary | ICD-10-CM

## 2018-04-20 DIAGNOSIS — Z955 Presence of coronary angioplasty implant and graft: Secondary | ICD-10-CM

## 2018-04-20 NOTE — Progress Notes (Signed)
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 currently on medication for this which is helpful.  His mood is more negative and he attributes that to his health and his chronic pain.  Additionally stress if having "so many people living in his house."  Deanglo has a goal to get heart healthier and "stronger" overall.  Staff will follow with him.     Expected Outcomes  Short:  Marsel will attend this program regularly to help with his heart healthy goals to be stronger.  Also he will practice stress management for his health and mental health.  Long:  Aloys will be consistent in his positive self-care program for his health and mental health.    Continue Psychosocial Services   Follow up required by staff       Psychosocial Re-Evaluation:   Psychosocial Discharge (Final Psychosocial Re-Evaluation):   Vocational Rehabilitation: Provide vocational rehab assistance to qualifying candidates.   Vocational Rehab Evaluation &  Intervention: Vocational Rehab - 03/21/18 1353      Initial Vocational Rehab Evaluation & Intervention   Assessment shows need for Vocational Rehabilitation  No       Education: Education Goals: Education classes will be provided on a variety of topics geared toward better understanding of heart health and risk factor modification. Participant will state understanding/return demonstration of topics presented as noted by education test scores.  Learning Barriers/Preferences: Learning Barriers/Preferences - 03/21/18 1351      Learning Barriers/Preferences   Learning Barriers  Hearing;Exercise Concerns;Sight    Learning Preferences  None       Education Topics:  AED/CPR: - Group verbal and written instruction with the use of models to demonstrate the basic use of the AED with the basic ABC's of resuscitation.   General Nutrition Guidelines/Fats and Fiber: -Group instruction provided by verbal, written material, models and posters to present the general guidelines for heart healthy nutrition. Gives an explanation and review of dietary fats and fiber.   Cardiac Rehab from 04/19/2018 in [facility] Cardiac and Pulmonary Rehab  Date  04/19/18  Educator  LB  Instruction Review Code  1- Verbalizes Understanding      Controlling Sodium/Reading Food Labels: -Group verbal and written material supporting the discussion of sodium use in heart healthy nutrition. Review and explanation with models, verbal and written materials for utilization of the food label.   Exercise  Physiology & General Exercise Guidelines: - Group verbal and written instruction with models to review the exercise physiology of the cardiovascular system and associated critical values. Provides general exercise guidelines with specific guidelines to those with heart or lung disease.    Aerobic Exercise & Resistance Training: - Gives group verbal and written instruction on the various components of exercise. Focuses on aerobic  and resistive training programs and the benefits of this training and how to safely progress through these programs..   Flexibility, Balance, Mind/Body Relaxation: Provides group verbal/written instruction on the benefits of flexibility and balance training, including mind/body exercise modes such as yoga, pilates and tai chi.  Demonstration and skill practice provided.   Stress and Anxiety: - Provides group verbal and written instruction about the health risks of elevated stress and causes of high stress.  Discuss the correlation between heart/lung disease and anxiety and treatment options. Review healthy ways to manage with stress and anxiety.   Cardiac Rehab from 04/19/2018 in [facility] Cardiac and Pulmonary Rehab  Date  03/29/18  Educator  [facility]  Instruction Review Code  1- Verbalizes Understanding      Depression: - Provides group verbal and written instruction on the correlation between heart/lung disease and depressed mood, treatment options, and the stigmas associated with seeking treatment.   Anatomy & Physiology of the Heart: - Group verbal and written instruction and models provide basic cardiac anatomy and physiology, with the coronary electrical and arterial systems. Review of Valvular disease and Heart Failure   Cardiac Rehab from 04/19/2018 in [facility] Cardiac and Pulmonary Rehab  Date  03/31/18  Educator  CE  Instruction Review Code  1- Verbalizes Understanding      Cardiac Procedures: - Group verbal and written instruction to review commonly prescribed medications for heart disease. Reviews the medication, class of the drug, and side effects. Includes the steps to properly store meds and maintain the prescription regimen. (beta blockers and nitrates)   Cardiac Medications I: - Group verbal and written instruction to review commonly prescribed medications for heart disease. Reviews the medication, class of the drug, and side effects. Includes the steps to properly store meds and  maintain the prescription regimen.   Cardiac Rehab from 04/19/2018 in [facility] Cardiac and Pulmonary Rehab  Date  04/05/18  Educator  SB  Instruction Review Code  1- Verbalizes Understanding      Cardiac Medications II: -Group verbal and written instruction to review commonly prescribed medications for heart disease. Reviews the medication, class of the drug, and side effects. (all other drug classes)    Go Sex-Intimacy & Heart Disease, Get SMART - Goal Setting: - Group verbal and written instruction through game format to discuss heart disease and the return to sexual intimacy. Provides group verbal and written material to discuss and apply goal setting through the application of the S.M.A.R.T. Method.   Other Matters of the Heart: - Provides group verbal, written materials and models to describe Stable Angina and Peripheral Artery. Includes description of the disease process and treatment options available to the cardiac patient.   Cardiac Rehab from 04/19/2018 in [facility] Cardiac and Pulmonary Rehab  Date  03/31/18  Educator  CE  Instruction Review Code  1- Verbalizes Understanding      Exercise & Equipment Safety: - Individual verbal instruction and demonstration of equipment use and safety with use of the equipment.   Cardiac Rehab from 04/19/2018 in [facility] Cardiac and Pulmonary Rehab  Date  03/21/18  Educator  [facility]  Instruction  Review Code  1- Verbalizes Understanding      Infection Prevention: - Provides verbal and written material to individual with discussion of infection control including proper hand washing and proper equipment cleaning during exercise session.   Cardiac Rehab from 04/19/2018 in [facility] Cardiac and Pulmonary Rehab  Date  03/21/18  Educator  [facility]  Instruction Review Code  1- Verbalizes Understanding      Falls Prevention: - Provides verbal and written material to individual with discussion of falls prevention and safety.   Cardiac Rehab from 04/19/2018 in [facility]  Cardiac and Pulmonary Rehab  Date  03/21/18  Educator  [facility]  Instruction Review Code  1- Verbalizes Understanding      Diabetes: - Individual verbal and written instruction to review signs/symptoms of diabetes, desired ranges of glucose level fasting, after meals and with exercise. Acknowledge that pre and post exercise glucose checks will be done for 3 sessions at entry of program.   Know Your Numbers and Risk Factors: -Group verbal and written instruction about important numbers in your health.  Discussion of what are risk factors and how they play a role in the disease process.  Review of Cholesterol, Blood Pressure, Diabetes, and BMI and the role they play in your overall health.   Sleep Hygiene: -Provides group verbal and written instruction about how sleep can affect your health.  Define sleep hygiene, discuss sleep cycles and impact of sleep habits. Review good sleep hygiene tips.    Cardiac Rehab from 04/19/2018 in [facility] Cardiac and Pulmonary Rehab  Date  04/12/18  Educator  [facility]  Instruction Review Code  1- Verbalizes Understanding      Other: -Provides group and verbal instruction on various topics (see comments)   Knowledge Questionnaire Score: Knowledge Questionnaire Score - 03/21/18 1352      Knowledge Questionnaire Score   Pre Score  23/26   correct answers reviewed with Glendell Docker, focus on Angina, exerice, nutrition      Core Components/Risk Factors/Patient Goals at Admission: Personal Goals and Risk Factors at Admission - 03/21/18 1347      Core Components/Risk Factors/Patient Goals on Admission    Weight Management  Yes;Weight Loss    Intervention  Weight Management: Provide education and appropriate resources to help participant work on and attain dietary goals.;Weight Management: Develop a combined nutrition and exercise program designed to reach desired caloric intake, while maintaining appropriate intake of nutrient and fiber, sodium and fats, and appropriate  energy expenditure required for the weight goal.;Weight Management/Obesity: Establish reasonable short term and long term weight goals.    Admit Weight  194 lb (88 kg)    Goal Weight: Short Term  190 lb (86.2 kg)    Goal Weight: Long Term  180 lb (81.6 kg)    Expected Outcomes  Short Term: Continue to assess and modify interventions until short term weight is achieved;Long Term: Adherence to nutrition and physical activity/exercise program aimed toward attainment of established weight goal;Weight Loss: Understanding of general recommendations for a balanced deficit meal plan, which promotes 1-2 lb weight loss per week and includes a negative energy balance of [phone] kcal/d;Understanding recommendations for meals to include 15-35% energy as protein, 25-35% energy from fat, 35-60% energy from carbohydrates, less than 277m of dietary cholesterol, 20-35 gm of total fiber daily;Understanding of distribution of calorie intake throughout the day with the consumption of 4-5 meals/snacks    Hypertension  Yes    Intervention  Provide education on lifestyle modifcations including regular physical activity/exercise, weight management,  moderate sodium restriction and increased consumption of fresh fruit, vegetables, and low fat dairy, alcohol moderation, and smoking cessation.;Monitor prescription use compliance.    Expected Outcomes  Short Term: Continued assessment and intervention until BP is < 140/62m HG in hypertensive participants. < 130/844mHG in hypertensive participants with diabetes, heart failure or chronic kidney disease.;Long Term: Maintenance of blood pressure at goal levels.    Lipids  Yes    Intervention  Provide education and support for participant on nutrition & aerobic/resistive exercise along with prescribed medications to achieve LDL <7070mHDL >50m17m  Expected Outcomes  Short Term: Participant states understanding of desired cholesterol values and is compliant with medications prescribed.  Participant is following exercise prescription and nutrition guidelines.;Long Term: Cholesterol controlled with medications as prescribed, with individualized exercise RX and with personalized nutrition plan. Value goals: LDL < 70mg50mL > 40 mg.       Core Components/Risk Factors/Patient Goals Review:  Goals and Risk Factor Review    Row Name 04/07/18 1011             Core Components/Risk Factors/Patient Goals Review   Personal Goals Review  Weight Management/Obesity;Hypertension;Lipids       Review  CD is working on weight loss.  He is down to 190lbs and would like to continue to work 180lbs.  He continues to want to increase strength and stamina.  His blood pressures have been doing well.  He checks them on occasional and we talked about increasing his frequency to 3-4x week.  He is on 17 medications and really would like to come off of some of them.  He is doing well on his medicaitons.        Expected Outcomes  Short: Start taking blood pressure more often Long: Continue to work on weight loss.           Core Components/Risk Factors/Patient Goals at Discharge (Final Review):  Goals and Risk Factor Review - 04/07/18 1011      Core Components/Risk Factors/Patient Goals Review   Personal Goals Review  Weight Management/Obesity;Hypertension;Lipids    Review  CD is working on weight loss.  He is down to 190lbs and would like to continue to work 180lbs.  He continues to want to increase strength and stamina.  His blood pressures have been doing well.  He checks them on occasional and we talked about increasing his frequency to 3-4x week.  He is on 17 medications and really would like to come off of some of them.  He is doing well on his medicaitons.     Expected Outcomes  Short: Start taking blood pressure more often Long: Continue to work on weight loss.        ITP Comments: ITP Comments    Row Name 03/21/18 1323 03/23/18 0903 04/20/18 0614       ITP Comments  Med Review completed.  Initial ITP created. Diagnosis can be found in [facility]  30 day review completed. ITP sent to Dr. Bert Ramonita Labering for Dr. Mark Emily Filbertical Director of Cardiac Rehab. Continue with ITP unless changes are made by physician.  New to program.  Has not started exercise yet  30 day review completed. ITP sent to Dr. Mark Emily Filbertical Director of Cardiac Rehab. Continue with ITP unless changes are made by physician        Comments:

## 2018-04-21 ENCOUNTER — Encounter: Payer: Medicare Other | Admitting: *Deleted

## 2018-04-21 DIAGNOSIS — Z955 Presence of coronary angioplasty implant and graft: Secondary | ICD-10-CM

## 2018-04-21 DIAGNOSIS — Z48812 Encounter for surgical aftercare following surgery on the circulatory system: Secondary | ICD-10-CM | POA: Diagnosis not present

## 2018-04-21 DIAGNOSIS — I214 Non-ST elevation (NSTEMI) myocardial infarction: Secondary | ICD-10-CM

## 2018-04-21 NOTE — Progress Notes (Signed)
Daily Session Note  Patient Details  Name: Clayton Anderson MRN: [id number] Date of Birth: 16-Jul-1946 Referring Provider:     Cardiac Rehab from 03/21/2018 in [facility] Cardiac and Pulmonary Rehab  Referring Provider  Landry Corporal MD      Encounter Date: 04/21/2018  Check In: Session Check In - 04/21/18 0929      Check-In   Supervising physician immediately available to respond to emergencies  See telemetry face sheet for immediately available ER MD    Location  [facility]-Cardiac & Pulmonary Rehab    Staff Present  Alberteen Sam, MA, RCEP, CCRP, Exercise Physiologist;Carroll Enterkin, RN, Geralyn Corwin, RN BSN    Medication changes reported      No    Fall or balance concerns reported     No    Tobacco Cessation  No Change    Warm-up and Cool-down  Performed on first and last piece of equipment    Resistance Training Performed  Yes    VAD Patient?  No    PAD/SET Patient?  No      Pain Assessment   Currently in Pain?  No/denies    Multiple Pain Sites  No          Social History   Tobacco Use  Smoking Status Former Smoker  . Packs/day: 0.90  . Years: 40.00  . Pack years: 36.00  . Types: Cigarettes  . Last attempt to quit: 07/19/2004  . Years since quitting: 13.7  Smokeless Tobacco Never Used    Goals Met:  Independence with exercise equipment Exercise tolerated well No report of cardiac concerns or symptoms Strength training completed today  Goals Unmet:  Not Applicable  Comments: Pt able to follow exercise prescription today without complaint.  Will continue to monitor for progression.    Dr. Emily Filbert is Medical Director for [facility] and [facility].

## 2018-04-26 ENCOUNTER — Encounter: Payer: Medicare Other | Admitting: *Deleted

## 2018-04-26 DIAGNOSIS — Z48812 Encounter for surgical aftercare following surgery on the circulatory system: Secondary | ICD-10-CM | POA: Diagnosis not present

## 2018-04-26 DIAGNOSIS — I214 Non-ST elevation (NSTEMI) myocardial infarction: Secondary | ICD-10-CM

## 2018-04-26 DIAGNOSIS — Z955 Presence of coronary angioplasty implant and graft: Secondary | ICD-10-CM

## 2018-04-26 NOTE — Progress Notes (Signed)
Daily Session Note  Patient Details  Name: Clayton Anderson MRN: [id number] Date of Birth: [birth year] Referring Provider:     Cardiac Rehab from 03/21/2018 in [facility] Cardiac and Pulmonary Rehab  Referring Provider  Landry Corporal MD      Encounter Date: 04/26/2018  Check In: Session Check In - 04/26/18 0925      Check-In   Supervising physician immediately available to respond to emergencies  See telemetry face sheet for immediately available ER MD    Location  [facility]-Cardiac & Pulmonary Rehab    Staff Present  Alberteen Sam, MA, RCEP, CCRP, Exercise Physiologist;Amanda Oletta Darter, BA, ACSM CEP, Exercise Physiologist;Krista Frederico Hamman, RN BSN    Medication changes reported      No    Fall or balance concerns reported     No    Warm-up and Cool-down  Performed on first and last piece of equipment    Resistance Training Performed  Yes    VAD Patient?  No    PAD/SET Patient?  No      Pain Assessment   Currently in Pain?  No/denies          Social History   Tobacco Use  Smoking Status Former Smoker  . Packs/day: 0.90  . Years: 40.00  . Pack years: 36.00  . Types: Cigarettes  . Last attempt to quit: 07/19/2004  . Years since quitting: 13.7  Smokeless Tobacco Never Used    Goals Met:  Independence with exercise equipment Exercise tolerated well No report of cardiac concerns or symptoms Strength training completed today  Goals Unmet:  Not Applicable  Comments: Pt able to follow exercise prescription today without complaint.  Will continue to monitor for progression.    Dr. Emily Filbert is Medical Director for [facility] and [facility].

## 2018-04-28 DIAGNOSIS — I214 Non-ST elevation (NSTEMI) myocardial infarction: Secondary | ICD-10-CM

## 2018-04-28 DIAGNOSIS — Z955 Presence of coronary angioplasty implant and graft: Secondary | ICD-10-CM

## 2018-04-28 DIAGNOSIS — Z48812 Encounter for surgical aftercare following surgery on the circulatory system: Secondary | ICD-10-CM | POA: Diagnosis not present

## 2018-04-28 NOTE — Progress Notes (Signed)
Daily Session Note  Patient Details  Name: Clayton Anderson MRN: [id number] Date of Birth: 1946-04-25 Referring Provider:     Cardiac Rehab from 03/21/2018 in [facility] Cardiac and Pulmonary Rehab  Referring Provider  Landry Corporal MD      Encounter Date: 04/28/2018  Check In: Session Check In - 04/28/18 0917      Check-In   Supervising physician immediately available to respond to emergencies  See telemetry face sheet for immediately available ER MD    Location  [facility]-Cardiac & Pulmonary Rehab    Staff Present  Alberteen Sam, MA, RCEP, CCRP, Exercise Physiologist;Joseph Foy [place], BA, ACSM CEP, Exercise Physiologist;Carroll Enterkin, RN, BSN    Medication changes reported      No    Fall or balance concerns reported     No    Warm-up and Cool-down  Performed on first and last piece of equipment    Resistance Training Performed  Yes    VAD Patient?  No    PAD/SET Patient?  No      Pain Assessment   Currently in Pain?  No/denies    Multiple Pain Sites  No          Social History   Tobacco Use  Smoking Status Former Smoker  . Packs/day: 0.90  . Years: 40.00  . Pack years: 36.00  . Types: Cigarettes  . Last attempt to quit: 07/19/2004  . Years since quitting: 13.7  Smokeless Tobacco Never Used    Goals Met:  Independence with exercise equipment Exercise tolerated well No report of cardiac concerns or symptoms Strength training completed today  Goals Unmet:  Not Applicable  Comments: Pt able to follow exercise prescription today without complaint.  Will continue to monitor for progression.    Dr. Emily Filbert is Medical Director for [place] and [facility].

## 2018-05-03 ENCOUNTER — Other Ambulatory Visit: Payer: Self-pay

## 2018-05-03 ENCOUNTER — Emergency Department
Admission: EM | Admit: 2018-05-03 | Discharge: 2018-05-03 | Disposition: A | Discharge status: Home / Self Care | Payer: Medicare Other | Attending: Emergency Medicine | Admitting: Emergency Medicine

## 2018-05-03 ENCOUNTER — Encounter: Payer: Self-pay | Admitting: Medical Oncology

## 2018-05-03 DIAGNOSIS — I10 Essential (primary) hypertension: Secondary | ICD-10-CM | POA: Diagnosis not present

## 2018-05-03 DIAGNOSIS — Z955 Presence of coronary angioplasty implant and graft: Secondary | ICD-10-CM | POA: Insufficient documentation

## 2018-05-03 DIAGNOSIS — E860 Dehydration: Secondary | ICD-10-CM | POA: Insufficient documentation

## 2018-05-03 DIAGNOSIS — I951 Orthostatic hypotension: Secondary | ICD-10-CM

## 2018-05-03 DIAGNOSIS — R531 Weakness: Secondary | ICD-10-CM | POA: Diagnosis present

## 2018-05-03 DIAGNOSIS — I2510 Atherosclerotic heart disease of native coronary artery without angina pectoris: Secondary | ICD-10-CM | POA: Diagnosis not present

## 2018-05-03 DIAGNOSIS — Z87891 Personal history of nicotine dependence: Secondary | ICD-10-CM | POA: Diagnosis not present

## 2018-05-03 DIAGNOSIS — I252 Old myocardial infarction: Secondary | ICD-10-CM | POA: Insufficient documentation

## 2018-05-03 LAB — COMPREHENSIVE METABOLIC PANEL
ALK PHOS: 64 U/L (ref 38–126)
ALT: 14 U/L (ref 0–44)
ANION GAP: 8 (ref 5–15)
AST: 22 U/L (ref 15–41)
Albumin: 4.8 g/dL (ref 3.5–5.0)
BUN: 31 mg/dL — ABNORMAL HIGH (ref 8–23)
CALCIUM: 9.2 mg/dL (ref 8.9–10.3)
CO2: 23 mmol/L (ref 22–32)
Chloride: 101 mmol/L (ref 98–111)
Creatinine, Ser: 1.78 mg/dL — ABNORMAL HIGH (ref 0.61–1.24)
GFR, EST AFRICAN AMERICAN: 43 mL/min — AB (ref >60)
GFR, EST NON AFRICAN AMERICAN: 37 mL/min — AB (ref >60)
Glucose, Bld: 103 mg/dL — ABNORMAL HIGH (ref 70–99)
Potassium: 5.4 mmol/L — ABNORMAL HIGH (ref 3.5–5.1)
SODIUM: 132 mmol/L — AB (ref 135–145)
TOTAL PROTEIN: 7.8 g/dL (ref 6.5–8.1)
Total Bilirubin: 0.7 mg/dL (ref 0.3–1.2)

## 2018-05-03 LAB — CBC WITH DIFFERENTIAL/PLATELET
BASOS PCT: 1 %
Basophils Absolute: 0.1 10*3/uL (ref 0–0.1)
EOS ABS: 0.3 10*3/uL (ref 0–0.7)
Eosinophils Relative: 4 %
HCT: 38.7 % — ABNORMAL LOW (ref 40.0–52.0)
HEMOGLOBIN: 13.5 g/dL (ref 13.0–18.0)
Lymphocytes Relative: 26 %
Lymphs Abs: 1.8 10*3/uL (ref 1.0–3.6)
MCH: 29.9 pg (ref 26.0–34.0)
MCHC: 34.9 g/dL (ref 32.0–36.0)
MCV: 85.5 fL (ref 80.0–100.0)
MONOS PCT: 7 %
Monocytes Absolute: 0.5 10*3/uL (ref 0.2–1.0)
NEUTROS PCT: 62 %
Neutro Abs: 4.4 10*3/uL (ref 1.4–6.5)
Platelets: 292 10*3/uL (ref 150–440)
RBC: 4.53 MIL/uL (ref 4.40–5.90)
RDW: 13.5 % (ref 11.5–14.5)
WBC: 7.1 10*3/uL (ref 3.8–10.6)

## 2018-05-03 LAB — URINALYSIS, COMPLETE (UACMP) WITH MICROSCOPIC
BACTERIA UA: NONE SEEN
Bilirubin Urine: NEGATIVE
GLUCOSE, UA: NEGATIVE mg/dL
HGB URINE DIPSTICK: NEGATIVE
Ketones, ur: NEGATIVE mg/dL
Leukocytes, UA: NEGATIVE
Nitrite: NEGATIVE
PH: 5 (ref 5.0–8.0)
PROTEIN: NEGATIVE mg/dL
SPECIFIC GRAVITY, URINE: 1.011 (ref 1.005–1.030)
Squamous Epithelial / LPF: NONE SEEN (ref 0–5)

## 2018-05-03 LAB — TROPONIN I

## 2018-05-03 MED ORDER — SODIUM CHLORIDE 0.9 % IV BOLUS
500.0000 mL | Freq: Once | INTRAVENOUS | Status: AC
  Administered 2018-05-03: 500 mL via INTRAVENOUS

## 2018-05-03 MED ORDER — SODIUM CHLORIDE 0.9 % IV SOLN: 1000.0000 mL | INTRAVENOUS | Status: DC

## 2018-05-03 MED ORDER — SODIUM CHLORIDE 0.9 % IV BOLUS (SEPSIS): 1000.0000 mL | Freq: Once | INTRAVENOUS | Status: DC

## 2018-05-03 MED ORDER — SODIUM CHLORIDE 0.9 % IV BOLUS (SEPSIS)
500.0000 mL | Freq: Once | INTRAVENOUS | Status: AC
  Administered 2018-05-03: 500 mL via INTRAVENOUS

## 2018-05-03 NOTE — ED Notes (Signed)
Pt states he was sent from cardiac rehab that he goes to twice weekly for the past 3 weeks. States this morning his b/p 200's/100's, states he took his daily meds including his b/p and pain meds, states he takes 12 doses of oxycodone daily for chronic neck pain. Pt states when he went to rehab his b/p was 70's/50's and is having some c/o dizziness upon standing. Pt is a/ox4 on arrival. In NAD..Marland Kitchen

## 2018-05-03 NOTE — ED Triage Notes (Signed)
Pt reports that he was at cardiac rehab today and they checked his bp and it was 70's SBP. Pt reports he feels weak.

## 2018-05-03 NOTE — ED Notes (Addendum)
First Nurse Note: [facility]  Alert and oriented.  Hx of "fluctuating BP" this AM. [facility] 80/50 [facility] 76/50 [facility].  To Triage.  Radial pulse regular.  BP - 92/63.

## 2018-05-03 NOTE — ED Provider Notes (Signed)
[facility] Emergency Department Provider Note       Time seen: ----------------------------------------- 9:38 AM on 05/03/2018 -----------------------------------------   I have reviewed the triage vital signs and the nursing notes.  HISTORY   Chief Complaint Hypotension    HPI Clayton Anderson is a 72 y.o. male with a history of anxiety, coronary artery disease, depression, hyperlipidemia, GERD, hypertension, MI who presents to the ED for weakness and hypotension.  Patient was at cardiac rehab today where his blood pressure was checked and was discovered to be in the 70s.  Patient reports he feels weak.  Blood pressure was in the 70s and 80s according to the staff at [facility].  Patient states this happens from time to time, he denies any recent illness or change in his medicines.  Past Medical History:  Diagnosis Date  . Anxiety   . Arthritis   . Baker's cyst of knee    left  . Cervical disc disease 07/20/2011  . Complication of anesthesia    " I wake up in a combative mode"  . Coronary artery disease   . Depression   . Elevated cholesterol   . GERD (gastroesophageal reflux disease)   . Headache(784.0)    years ago  . History of hiatal hernia   . Hypertension   . Myocardial infarct [facility](HCC)    2001  sees Dr. York SpanielS Tilley  . Neuromuscular disorder (HCC)    hx of shingles/ myofacitis  . Shortness of breath     Patient Active Problem List   Diagnosis Date Noted  . PAF (paroxysmal atrial fibrillation) (HCC)   . Non-ST elevation (NSTEMI) myocardial infarction (HCC) 07/02/2017  . NSTEMI (non-ST elevated myocardial infarction) (HCC) 07/02/2017  . Acute ST elevation myocardial infarction (STEMI) involving left anterior descending (LAD) coronary artery (HCC)   . Status post cervical spinal fusion 10/24/2015  . DDD (degenerative disc disease), cervical 10/24/2015  . Cervical facet syndrome 10/24/2015  . Bilateral occipital neuralgia 10/24/2015  . S/P  shoulder surgery 10/24/2015  . Dyspnea 08/17/2011  . Pulmonary nodule 08/17/2011  . CAD (coronary artery disease)   . GERD (gastroesophageal reflux disease)   . Hypertension 07/20/2011  . Hyperlipidemia 07/20/2011  . Cervical disc disease     Past Surgical History:  Procedure Laterality Date  . COLONOSCOPY    . CORONARY ANGIOGRAPHY N/A 07/02/2017   Procedure: CORONARY ANGIOGRAPHY;  Surgeon: Kathleene HazelMcAlhany, Christopher D, MD;  [facility] IN[facility] LAB;  Service: Cardiovascular;  Laterality: N/A;  . CORONARY ANGIOPLASTY     x 2  last one 07/2011  . CORONARY STENT INTERVENTION N/A 07/02/2017   Procedure: CORONARY STENT INTERVENTION;  Surgeon: Tonny Bollmanooper, Michael, MD;  [facility];  Service: Cardiovascular;  Laterality: N/A;  . CORONARY STENT PLACEMENT  2001   Tetra Multi Link Stent-3T MRI compatible, normal mode  . CORONARY/GRAFT ACUTE MI REVASCULARIZATION N/A 07/02/2017   Procedure: Coronary/Graft Acute MI Revascularization;  Surgeon: Kathleene HazelMcAlhany, Christopher D, MD;  Location: [facility] INVASIVE CV LAB;  Service: Cardiovascular;  Laterality: N/A;  . LAMINECTOMY AND MICRODISCECTOMY CERVICAL SPINE     January and August 2010  . LEFT HEART CATH AND CORONARY ANGIOGRAPHY N/A 07/02/2017   Procedure: LEFT HEART CATH AND CORONARY ANGIOGRAPHY;  Surgeon: Tonny Bollmanooper, Michael, MD;  [facility];  Service: Cardiovascular;  Laterality: N/A;  . LEFT HEART CATHETERIZATION WITH CORONARY ANGIOGRAM N/A 07/20/2011   Procedure: LEFT HEART CATHETERIZATION WITH CORONARY ANGIOGRAM;  Surgeon: Othella BoyerWilliam S Tilley, MD;  Location: [facility] CATH LAB;  Service: Cardiovascular;  Laterality: N/A;  . SHOULDER ARTHROSCOPY WITH LABRAL REPAIR Left 12/01/2012   Procedure: LEFT SHOULDER ARTHROSCOPY WITH ASPIRATION OF DEGENERATIVE OF THE PARALABRAL CYST and OPEN DECOMPRESSION OF SUPRASCAPULAR NERVE;  Surgeon: Senaida Lange, MD;  Location: [facility] OR;  Service: Orthopedics;  Laterality: Left;  . SHOULDER SURGERY     twice   left shoulder    Allergies Sulfa antibiotics and Tetanus toxoids  Social History Social History   Tobacco Use  . Smoking status: Former Smoker    Packs/day: 0.90    Years: 40.00    Pack years: 36.00    Types: Cigarettes    Last attempt to quit: 07/19/2004    Years since quitting: 13.7  . Smokeless tobacco: Never Used  Substance Use Topics  . Alcohol use: Yes    Alcohol/week: 0.0 standard drinks    Comment: occasionally  . Drug use: No   Review of Systems Constitutional: Negative for fever. Cardiovascular: Negative for chest pain. Respiratory: Negative for shortness of breath. Gastrointestinal: Negative for abdominal pain, vomiting and diarrhea. Musculoskeletal: Negative for back pain. Skin: Negative for rash. Neurological: Positive for weakness  All systems negative/normal/unremarkable except as stated in the HPI  ____________________________________________   PHYSICAL EXAM:  VITAL SIGNS: ED Triage Vitals  Enc Vitals Group     BP 05/03/18 0929 92/63     Pulse Rate 05/03/18 0929 66     Resp 05/03/18 0929 18     Temp 05/03/18 0929 98.1 F (36.7 C)     Temp Source 05/03/18 0929 Oral     SpO2 05/03/18 0929 96 %     Weight 05/03/18 0936 190 lb (86.2 kg)     Height 05/03/18 0936 5\' 10"  (1.778 m)     Head Circumference --      Peak Flow --      Pain Score 05/03/18 0935 4     Pain Loc --      Pain Edu? --      Excl. in GC? --    Constitutional: Alert and oriented. Well appearing and in no distress. Eyes: Conjunctivae are normal. Normal extraocular movements. ENT   Head: Normocephalic and atraumatic.   Nose: No congestion/rhinnorhea.   Mouth/Throat: Mucous membranes are moist.   Neck: No stridor. Cardiovascular: Normal rate, regular rhythm. No murmurs, rubs, or gallops. Respiratory: Normal respiratory effort without tachypnea nor retractions. Breath sounds are clear and equal bilaterally. No wheezes/rales/rhonchi. Gastrointestinal: Soft and  nontender. Normal bowel sounds Musculoskeletal: Nontender with normal range of motion in extremities. No lower extremity tenderness nor edema. Neurologic:  Normal speech and language. No gross focal neurologic deficits are appreciated.  Skin:  Skin is warm, dry and intact. No rash noted. Psychiatric: Mood and affect are normal. Speech and behavior are normal.  ____________________________________________  EKG: Interpreted by me.  Sinus rhythm rate 62 bpm, prolonged PR interval, normal QRS, normal QT  ____________________________________________  ED COURSE:  As part of my medical decision making, I reviewed the following data within the electronic MEDICAL RECORD NUMBER History obtained from family if available, nursing notes, old chart and ekg, as well as notes from prior ED visits. Patient presented for weakness, we will assess with labs and imaging as indicated at this time.   Procedures ____________________________________________   LABS (pertinent positives/negatives)  Labs Reviewed  CBC WITH DIFFERENTIAL/PLATELET - Abnormal; Notable for the following components:      Result Value   HCT 38.7 (*)  All other components within normal limits  COMPREHENSIVE METABOLIC PANEL - Abnormal; Notable for the following components:   Sodium 132 (*)    Potassium 5.4 (*)    Glucose, Bld 103 (*)    BUN 31 (*)    Creatinine, Ser 1.78 (*)    GFR calc non Af Amer 37 (*)    GFR calc Af Amer 43 (*)    All other components within normal limits  URINALYSIS, COMPLETE (UACMP) WITH MICROSCOPIC - Abnormal; Notable for the following components:   Color, Urine YELLOW (*)    APPearance CLEAR (*)    All other components within normal limits  TROPONIN I  CBG MONITORING, ED   ____________________________________________  DIFFERENTIAL DIAGNOSIS   Dehydration, electrolyte abnormality, medication side effect, arrhythmia, occult infection  FINAL ASSESSMENT AND PLAN  Weakness, orthostatic  hypotension   Plan: The patient had presented for weakness and hypotension prior to arrival. Patient's labs did reveal some degree of dehydration.  We ended up giving him a full liter of fluids.  He is feeling better, is cleared for outpatient follow-up.   Clayton Dash, MD   Note: This note was generated in part or whole with voice recognition software. Voice recognition is usually quite accurate but there are transcription errors that can and very often do occur. I apologize for any typographical errors that were not detected and corrected.     Emily Filbert, MD 05/03/18 1249

## 2018-05-05 DIAGNOSIS — Z48812 Encounter for surgical aftercare following surgery on the circulatory system: Secondary | ICD-10-CM | POA: Diagnosis not present

## 2018-05-05 DIAGNOSIS — I214 Non-ST elevation (NSTEMI) myocardial infarction: Secondary | ICD-10-CM

## 2018-05-05 NOTE — Progress Notes (Signed)
Daily Session Note  Patient Details  Name: Clayton Anderson MRN: [id number] Date of Birth: 07-01-1946 Referring Provider:     Cardiac Rehab from 03/21/2018 in [facility] Cardiac and Pulmonary Rehab  Referring Provider  Landry Corporal MD      Encounter Date: 05/05/2018  Check In: Session Check In - 05/05/18 0914      Check-In   Supervising physician immediately available to respond to emergencies  See telemetry face sheet for immediately available ER MD    Location  [facility]-Cardiac & Pulmonary Rehab    Staff Present  Alberteen Sam, MA, RCEP, CCRP, Exercise Physiologist;Trae Bovenzi RCP,RRT,BSRT;Carroll Enterkin, RN, BSN;Leslie Psychologist, prison and probation services, BSN    Medication changes reported      No    Fall or balance concerns reported     No    Warm-up and Cool-down  Performed on first and last piece of Teacher, music Performed  Yes    VAD Patient?  No      Pain Assessment   Currently in Pain?  No/denies          Social History   Tobacco Use  Smoking Status Former Smoker  . Packs/day: 0.90  . Years: 40.00  . Pack years: 36.00  . Types: Cigarettes  . Last attempt to quit: 07/19/2004  . Years since quitting: 13.8  Smokeless Tobacco Never Used    Goals Met:  Independence with exercise equipment Exercise tolerated well Personal goals reviewed No report of cardiac concerns or symptoms Strength training completed today  Goals Unmet:  Not Applicable  Comments: Pt able to follow exercise prescription today without complaint.  Will continue to monitor for progression.    Dr. Emily Filbert is Medical Director for [facility].

## 2018-05-09 ENCOUNTER — Ambulatory Visit (INDEPENDENT_AMBULATORY_CARE_PROVIDER_SITE_OTHER): Payer: Medicare Other | Admitting: Cardiology

## 2018-05-09 ENCOUNTER — Encounter: Payer: Self-pay | Admitting: Cardiology

## 2018-05-09 VITALS — BP 132/72 | HR 63 | Ht 70.0 in | Wt 189.0 lb

## 2018-05-09 DIAGNOSIS — J449 Chronic obstructive pulmonary disease, unspecified: Secondary | ICD-10-CM | POA: Insufficient documentation

## 2018-05-09 DIAGNOSIS — I10 Essential (primary) hypertension: Secondary | ICD-10-CM | POA: Diagnosis not present

## 2018-05-09 DIAGNOSIS — I252 Old myocardial infarction: Secondary | ICD-10-CM | POA: Insufficient documentation

## 2018-05-09 DIAGNOSIS — I2510 Atherosclerotic heart disease of native coronary artery without angina pectoris: Secondary | ICD-10-CM

## 2018-05-09 DIAGNOSIS — Z01810 Encounter for preprocedural cardiovascular examination: Secondary | ICD-10-CM | POA: Insufficient documentation

## 2018-05-09 NOTE — Progress Notes (Signed)
Cardiology Office Note:    Date:  05/09/2018   ID:  Clayton Anderson, DOB August 20, 1945, MRN [id number]  PCP:  Kandyce Rud, MD  Cardiologist:  Garwin Brothers, MD   Referring MD: Kandyce Rud, MD    ASSESSMENT:    1. Essential hypertension   2. Coronary artery disease involving native coronary artery of native heart without angina pectoris   3. Preop cardiovascular exam    PLAN:    In order of problems listed above:  1. Secondary prevention stressed with the patient.  Importance of compliance with diet and medication stressed and he vocalized understanding.  His blood pressure is stable.  Diet was discussed with dyslipidemia.  This is addressed by his primary care physician. 2. Dr. Donnie Aho is his cardiologist and I am seeing him on a as needed basis at this time.  Dr. Donnie Aho is away at this time. 3. In view of his significant issues and sedentary lifestyle and multiple coronary interventions I told him that it would be prudent to undergo Lexiscan sestamibi to assess objectively his coronary artery disease.  If this is negative then he is not at high risk for coronary events during the aforementioned surgery.  Meticulous hemodynamic monitoring will further reduce the risk of coronary events. 4. I agree with Dr. Donnie Aho.  If appropriate and elective then he needs to complete his course of 12 months of dual antiplatelet therapy before we stop the clopidogrel.  Even stopping this medication would be for a transient.  Of time which we will discuss at the appropriate time. 5. The patient's ejection fraction was mildly reduced on the last echocardiogram done last year and I would like to get an assessment of this and wall motion.  We will schedule him for an echocardiogram. 6. He will be seen in follow-up appointment in 6 months or earlier if he has any concerns on a as needed basis.  I told him that once Dr. Donnie Aho returns he can make an appointment and continue to see Dr. Donnie Aho in appointment.   Time for this evaluation was 40 minutes.   Medication Adjustments/Labs and Tests Ordered: Current medicines are reviewed at length with the patient today.  Concerns regarding medicines are outlined above.  Orders Placed This Encounter  Procedures  . MYOCARDIAL PERFUSION IMAGING  . ECHOCARDIOGRAM COMPLETE   No orders of the defined types were placed in this encounter.    No chief complaint on file.    History of Present Illness:    Clayton Anderson is a 72 y.o. male.  Patient has past medical history of coronary artery disease and has undergone coronary stenting on multiple occasions.  He mentions to me that last November he had an acute myocardial infarction and underwent coronary stenting.  Reports indicate that he had a dissection and subsequently this was attended to.  Patient leads a sedentary lifestyle.  He has significant orthopedic issues involving his spine and is contemplating the possibility of surgery.  He denies any chest pain orthopnea or PND.  He leads a fairly sedentary lifestyle.  At the time of my evaluation, the patient is alert awake oriented and in no distress.  Past Medical History:  Diagnosis Date  . Anxiety   . Arthritis   . Baker's cyst of knee    left  . Cervical disc disease 07/20/2011  . Complication of anesthesia    " I wake up in a combative mode"  . Coronary artery disease   . Depression   .  Elevated cholesterol   . GERD (gastroesophageal reflux disease)   . Headache(784.0)    years ago  . History of hiatal hernia   . Hypertension   . Myocardial infarct [facility])    2001  sees Dr. York Spaniel  . Neuromuscular disorder (HCC)    hx of shingles/ myofacitis  . Shortness of breath     Past Surgical History:  Procedure Laterality Date  . COLONOSCOPY    . CORONARY ANGIOGRAPHY N/A 07/02/2017   Procedure: CORONARY ANGIOGRAPHY;  Surgeon: Kathleene Hazel, MD;  [facility] INV[facility] CV LAB;  Service: Cardiovascular;  Laterality: N/A;  . CORONARY  ANGIOPLASTY     x 2  last one 07/2011  . CORONARY STENT INTERVENTION N/A 07/02/2017   Procedure: CORONARY STENT INTERVENTION;  Surgeon: Tonny Bollman, MD;  Location: [facility] INVASIVE CV LAB;  Service: Cardiovascular;  Laterality: N/A;  . CORONARY STENT PLACEMENT  2001   Tetra Multi Link Stent-3T MRI compatible, normal mode  . CORONARY/GRAFT ACUTE MI REVASCULARIZATION N/A 07/02/2017   Procedure: Coronary/Graft Acute MI Revascularization;  Surgeon: Kathleene Hazel, MD;  [facility]: [facility] INVASIVE CV LAB;  Service: Cardiovascular;  Laterality: N/A;  . LAMINECTOMY AND MICRODISCECTOMY CERVICAL SPINE     January and August 2010  . LEFT HEART CATH AND CORONARY ANGIOGRAPHY N/A 07/02/2017   Procedure: LEFT HEART CATH AND CORONARY ANGIOGRAPHY;  Surgeon: Tonny Bollman, MD;  Location: [facility] INVASIVE CV LAB;  Service: Cardiovascular;  Laterality: N/A;  . LEFT HEART CATHETERIZATION WITH CORONARY ANGIOGRAM N/A 07/20/2011   Procedure: LEFT HEART CATHETERIZATION WITH CORONARY ANGIOGRAM;  Surgeon: Othella Boyer, MD;  Location: [facility] CATH LAB;  Service: Cardiovascular;  Laterality: N/A;  . SHOULDER ARTHROSCOPY WITH LABRAL REPAIR Left 12/01/2012   Procedure: LEFT SHOULDER ARTHROSCOPY WITH ASPIRATION OF DEGENERATIVE OF THE PARALABRAL CYST and OPEN DECOMPRESSION OF SUPRASCAPULAR NERVE;  Surgeon: Senaida Lange, MD;  Location: [facility] OR;  Service: Orthopedics;  Laterality: Left;  . SHOULDER SURGERY     twice  left shoulder    Current Medications: Current Meds  Medication Sig  . amLODipine (NORVASC) 10 MG tablet Take 1 tablet (10 mg total) by mouth daily.  Marland Kitchen aspirin EC 81 MG tablet Take 81 mg by mouth daily.    Marland Kitchen buPROPion (WELLBUTRIN XL) 150 MG 24 hr tablet Take 150 mg by mouth every morning.  . carvedilol (COREG) 3.125 MG tablet Take 1 tablet (3.125 mg total) by mouth 2 (two) times daily with a meal.  . clopidogrel (PLAVIX) 75 MG tablet   . DULoxetine (CYMBALTA) 60 MG capsule   . escitalopram (LEXAPRO) 10 MG tablet  Take 10 mg by mouth daily. Reported on 02/03/2016  . folic acid (FOLVITE) 800 MCG tablet Take 800 mcg by mouth daily. Reported on 02/03/2016  . gabapentin (NEURONTIN) 300 MG capsule Take 300 mg by mouth at bedtime.   . nitroGLYCERIN (NITROSTAT) 0.4 MG SL tablet Place 0.4 mg under the tongue every 5 (five) minutes as needed.    . Oxycodone HCl 20 MG TABS Take 40-60 mg by mouth every 4 (four) hours as needed (pain).  . ramipril (ALTACE) 10 MG capsule Take 10 mg by mouth daily.    . rosuvastatin (CRESTOR) 40 MG tablet Take 1 tablet (40 mg total) by mouth daily at 6 PM.  . spironolactone (ALDACTONE) 25 MG tablet Take 0.5 tablets (12.5 mg total) by mouth daily.  Marland Kitchen tiZANidine (ZANAFLEX) 2 MG tablet Take 2-4 mg by mouth 4 (four) times daily as needed for muscle  spasms.     Allergies:   Sulfa antibiotics and Tetanus toxoids   Social History   Socioeconomic History  . Marital status: Married    Spouse name: Candon Caras  . Number of children: Y  . Years of education: Not on file  . Highest education level: Not on file  Occupational History  . Occupation: retired Research scientist (physical sciences)  . Financial resource strain: Not on file  . Food insecurity:    Worry: Not on file    Inability: Not on file  . Transportation needs:    Medical: Not on file    Non-medical: Not on file  Tobacco Use  . Smoking status: Former Smoker    Packs/day: 0.90    Years: 40.00    Pack years: 36.00    Types: Cigarettes    Last attempt to quit: 07/19/2004    Years since quitting: 13.8  . Smokeless tobacco: Never Used  Substance and Sexual Activity  . Alcohol use: Yes    Alcohol/week: 0.0 standard drinks    Comment: occasionally  . Drug use: No  . Sexual activity: Never  Lifestyle  . Physical activity:    Days per week: Not on file    Minutes per session: Not on file  . Stress: Not on file  Relationships  . Social connections:    Talks on phone: Not on file    Gets together: Not on file    Attends  religious service: Not on file    Active member of club or organization: Not on file    Attends meetings of clubs or organizations: Not on file    Relationship status: Not on file  Other Topics Concern  . Not on file  Social History Narrative  . Not on file     Family History: The patient's family history includes Alcohol abuse in his father; Birth defects in his brother; Diabetes in his maternal grandmother; Hyperlipidemia in his father.  ROS:   Please see the history of present illness.    All other systems reviewed and are negative.  EKGs/Labs/Other Studies Reviewed:    The following studies were reviewed today: I discussed my findings with the patient at extensive length.   Recent Labs: 05/03/2018: ALT 14; BUN 31; Creatinine, Ser 1.78; Hemoglobin 13.5; Platelets 292; Potassium 5.4; Sodium 132  Recent Lipid Panel    Component Value Date/Time   CHOL 219 (H) 07/03/2017 0154   TRIG 272 (H) 07/03/2017 0154   HDL 34 (L) 07/03/2017 0154   CHOLHDL 6.4 07/03/2017 0154   VLDL 54 (H) 07/03/2017 0154   LDLCALC 131 (H) 07/03/2017 0154    Physical Exam:    VS:  Ht 5\' 10"  (1.778 m)   Wt 189 lb (85.7 kg)   SpO2 95%   BMI 27.12 kg/m    Pulse 63, blood pressure 132/72 mmHg, weight 189 pounds.  Oxygen saturation 95%.  Wt Readings from Last 3 Encounters:  05/09/18 189 lb (85.7 kg)  05/03/18 190 lb (86.2 kg)  03/21/18 194 lb 12.8 oz (88.4 kg)     GEN: Patient is in no acute distress HEENT: Normal NECK: No JVD; No carotid bruits LYMPHATICS: No lymphadenopathy CARDIAC: Hear sounds regular, 2/6 systolic murmur at the apex. RESPIRATORY:  Clear to auscultation without rales, wheezing or rhonchi  ABDOMEN: Soft, non-tender, non-distended MUSCULOSKELETAL:  No edema; No deformity  SKIN: Warm and dry NEUROLOGIC:  Alert and oriented x 3 PSYCHIATRIC:  Normal affect   Signed, Aundra Dubin Joleen Stuckert,  MD  05/09/2018 11:12 AM    [facility]

## 2018-05-09 NOTE — Patient Instructions (Addendum)
Medication Instructions:  Your physician recommends that you continue on your current medications as directed. Please refer to the Current Medication list given to you today.  Labwork: None  Testing/Procedures: Your physician has requested that you have an echocardiogram. Echocardiography is a painless test that uses sound waves to create images of your heart. It provides your doctor with information about the size and shape of your heart and how well your heart's chambers and valves are working. This procedure takes approximately one hour. There are no restrictions for this procedure.   Follow-Up: Your physician recommends that you schedule a follow-up appointment in: 6 months  Any Other Special Instructions Will Be Listed Below (If Applicable).   Please be sure to take your blood pressure twice daily (1 hour after meds) and log them. Please bring this to the office after two weeks of documentation.  If you need a refill on your cardiac medications before your next appointment, please call your pharmacy.   [facility]  Garey Ham, RN, BSN

## 2018-05-10 ENCOUNTER — Encounter: Payer: Medicare Other | Attending: Cardiology

## 2018-05-10 DIAGNOSIS — Z48812 Encounter for surgical aftercare following surgery on the circulatory system: Secondary | ICD-10-CM | POA: Diagnosis present

## 2018-05-10 DIAGNOSIS — Z955 Presence of coronary angioplasty implant and graft: Secondary | ICD-10-CM | POA: Diagnosis not present

## 2018-05-10 DIAGNOSIS — I214 Non-ST elevation (NSTEMI) myocardial infarction: Secondary | ICD-10-CM

## 2018-05-10 NOTE — Progress Notes (Signed)
Daily Session Note  Patient Details  Name: Clayton Anderson MRN: [id number] Date of Birth: 06-06-1946 Referring Provider:     Cardiac Rehab from 03/21/2018 in [facility] Cardiac and Pulmonary Rehab  Referring Provider  Landry Corporal MD      Encounter Date: 05/10/2018  Check In: Session Check In - 05/10/18 0917      Check-In   Supervising physician immediately available to respond to emergencies  See telemetry face sheet for immediately available ER MD    Location  [facility]-Cardiac & Pulmonary Rehab    Staff Present  Alberteen Sam, MA, RCEP, CCRP, Exercise Physiologist;Laureen Owens Shark, BS, RRT, Respiratory Dareen Piano, BA, ACSM CEP, Exercise Physiologist;Mary Kellie Shropshire, RN, BSN, MA    Warm-up and Cool-down  Performed on first and last piece of equipment    Resistance Training Performed  Yes    VAD Patient?  No    PAD/SET Patient?  No      Pain Assessment   Currently in Pain?  No/denies    Multiple Pain Sites  No          Social History   Tobacco Use  Smoking Status Former Smoker  . Packs/day: 0.90  . Years: 40.00  . Pack years: 36.00  . Types: Cigarettes  . Last attempt to quit: 07/19/2004  . Years since quitting: 13.8  Smokeless Tobacco Never Used    Goals Met:  Independence with exercise equipment Exercise tolerated well No report of cardiac concerns or symptoms Strength training completed today  Goals Unmet:  Not Applicable  Comments: Pt able to follow exercise prescription today without complaint.  Will continue to monitor for progression.    Dr. Emily Filbert is Medical Director for [facility].

## 2018-05-12 DIAGNOSIS — I214 Non-ST elevation (NSTEMI) myocardial infarction: Secondary | ICD-10-CM

## 2018-05-12 DIAGNOSIS — Z48812 Encounter for surgical aftercare following surgery on the circulatory system: Secondary | ICD-10-CM | POA: Diagnosis not present

## 2018-05-12 NOTE — Progress Notes (Signed)
Daily Session Note  Patient Details  Name: Clayton Anderson MRN: [id number] Date of Birth: January 25, 1946 Referring Provider:     Cardiac Rehab from 03/21/2018 in [facility] Cardiac and Pulmonary Rehab  Referring Provider  Landry Corporal MD      Encounter Date: 05/12/2018  Check In: Session Check In - 05/12/18 8882      Check-In   Supervising physician immediately available to respond to emergencies  See telemetry face sheet for immediately available ER MD    Location  [facility]-Cardiac & Pulmonary Rehab    Staff Present  Justin Mend Lorre Nick, MA, RCEP, CCRP, Exercise Physiologist;Carroll Enterkin, RN, BSN    Medication changes reported      No    Fall or balance concerns reported     No    Warm-up and Cool-down  Performed on first and last piece of equipment    Resistance Training Performed  Yes    VAD Patient?  No    PAD/SET Patient?  No      Pain Assessment   Currently in Pain?  No/denies          Social History   Tobacco Use  Smoking Status Former Smoker  . Packs/day: 0.90  . Years: 40.00  . Pack years: 36.00  . Types: Cigarettes  . Last attempt to quit: 07/19/2004  . Years since quitting: 13.8  Smokeless Tobacco Never Used    Goals Met:  Independence with exercise equipment Exercise tolerated well No report of cardiac concerns or symptoms Strength training completed today  Goals Unmet:  Not Applicable  Comments: Pt able to follow exercise prescription today without complaint.  Will continue to monitor for progression.    Dr. Emily Filbert is Medical Director for [place] and [facility].

## 2018-05-13 ENCOUNTER — Other Ambulatory Visit (HOSPITAL_BASED_OUTPATIENT_CLINIC_OR_DEPARTMENT_OTHER): Payer: Medicare Other

## 2018-05-17 ENCOUNTER — Encounter: Payer: Medicare Other | Admitting: *Deleted

## 2018-05-17 DIAGNOSIS — Z955 Presence of coronary angioplasty implant and graft: Secondary | ICD-10-CM

## 2018-05-17 DIAGNOSIS — Z48812 Encounter for surgical aftercare following surgery on the circulatory system: Secondary | ICD-10-CM | POA: Diagnosis not present

## 2018-05-17 DIAGNOSIS — I214 Non-ST elevation (NSTEMI) myocardial infarction: Secondary | ICD-10-CM

## 2018-05-17 NOTE — Progress Notes (Signed)
Daily Session Note  Patient Details  Name: Clayton Anderson MRN: [id number] Date of Birth: Feb 01, 1946 Referring Provider:     Cardiac Rehab from 03/21/2018 in [facility] Cardiac and Pulmonary Rehab  Referring Provider  Landry Corporal MD      Encounter Date: 05/17/2018  Check In: Session Check In - 05/17/18 0920      Check-In   Supervising physician immediately available to respond to emergencies  See telemetry face sheet for immediately available ER MD    Location  [facility]-Cardiac & Pulmonary Rehab    Staff Present  Alberteen Sam, MA, RCEP, CCRP, Exercise Physiologist;Amanda Oletta Darter, BA, ACSM CEP, Exercise Physiologist;Susanne Bice, RN, BSN, CCRP    Medication changes reported      No    Fall or balance concerns reported     No    Warm-up and Cool-down  Performed on first and last piece of equipment    Resistance Training Performed  Yes    VAD Patient?  No    PAD/SET Patient?  No      Pain Assessment   Currently in Pain?  No/denies          Social History   Tobacco Use  Smoking Status Former Smoker  . Packs/day: 0.90  . Years: 40.00  . Pack years: 36.00  . Types: Cigarettes  . Last attempt to quit: 07/19/2004  . Years since quitting: 13.8  Smokeless Tobacco Never Used    Goals Met:  Independence with exercise equipment Exercise tolerated well No report of cardiac concerns or symptoms Strength training completed today  Goals Unmet:  Not Applicable  Comments: Pt able to follow exercise prescription today without complaint.  Will continue to monitor for progression.    Dr. Emily Filbert is Medical Director for [place] and [facility].

## 2018-05-18 ENCOUNTER — Encounter: Payer: Self-pay | Admitting: *Deleted

## 2018-05-18 DIAGNOSIS — I214 Non-ST elevation (NSTEMI) myocardial infarction: Secondary | ICD-10-CM

## 2018-05-18 DIAGNOSIS — Z955 Presence of coronary angioplasty implant and graft: Secondary | ICD-10-CM

## 2018-05-18 NOTE — Progress Notes (Signed)
Cardiac Individual Treatment Plan  Patient Details  Name: Clayton Anderson MRN: [id number] Date of Birth: February 18, 1946 Referring Provider:     Cardiac Rehab from 03/21/2018 in [facility]  Referring Provider  Landry Corporal MD      Initial Encounter Date:    Cardiac Rehab from 03/21/2018 in [facility] Cardiac and Pulmonary Rehab  Date  03/21/18      Visit Diagnosis: NSTEMI (non-ST elevated myocardial infarction) [facility])  Status post coronary artery stent placement  Patient's Home Medications on Admission:  Current Outpatient Medications:  .  amLODipine (NORVASC) 10 MG tablet, Take 1 tablet (10 mg total) by mouth daily., Disp: 30 tablet, Rfl: 12 .  aspirin EC 81 MG tablet, Take 81 mg by mouth daily.  , Disp: , Rfl:  .  buPROPion (WELLBUTRIN XL) 150 MG 24 hr tablet, Take 150 mg by mouth every morning., Disp: , Rfl: 1 .  carvedilol (COREG) 3.125 MG tablet, Take 1 tablet (3.125 mg total) by mouth 2 (two) times daily with a meal., Disp: 60 tablet, Rfl: 12 .  clopidogrel (PLAVIX) 75 MG tablet, , Disp: , Rfl:  .  DULoxetine (CYMBALTA) 60 MG capsule, , Disp: , Rfl:  .  escitalopram (LEXAPRO) 10 MG tablet, Take 10 mg by mouth daily. Reported on 02/03/2016, Disp: , Rfl:  .  folic acid (FOLVITE) 195 MCG tablet, Take 800 mcg by mouth daily. Reported on 02/03/2016, Disp: , Rfl:  .  gabapentin (NEURONTIN) 300 MG capsule, Take 300 mg by mouth at bedtime. , Disp: , Rfl:  .  nitroGLYCERIN (NITROSTAT) 0.4 MG SL tablet, Place 0.4 mg under the tongue every 5 (five) minutes as needed.  , Disp: , Rfl:  .  Oxycodone HCl 20 MG TABS, Take 40-60 mg by mouth every 4 (four) hours as needed (pain)., Disp: , Rfl:  .  ramipril (ALTACE) 10 MG capsule, Take 10 mg by mouth daily.  , Disp: , Rfl:  .  rosuvastatin (CRESTOR) 40 MG tablet, Take 1 tablet (40 mg total) by mouth daily at 6 PM., Disp: 30 tablet, Rfl: 12 .  spironolactone (ALDACTONE) 25 MG tablet, Take 0.5 tablets (12.5 mg total) by mouth daily., Disp:  30 tablet, Rfl: `12 .  tiZANidine (ZANAFLEX) 2 MG tablet, Take 2-4 mg by mouth 4 (four) times daily as needed for muscle spasms., Disp: , Rfl: 0  Past Medical History: Past Medical History:  Diagnosis Date  . Anxiety   . Arthritis   . Baker's cyst of knee    left  . Cervical disc disease 07/20/2011  . Complication of anesthesia    " I wake up in a combative mode"  . Coronary artery disease   . Depression   . Elevated cholesterol   . GERD (gastroesophageal reflux disease)   . Headache(784.0)    years ago  . History of hiatal hernia   . Hypertension   . Myocardial infarct [facility])    2001  sees Dr. Thurman Coyer  . Neuromuscular disorder (HCC)    hx of shingles/ myofacitis  . Shortness of breath     Tobacco Use: Social History   Tobacco Use  Smoking Status Former Smoker  . Packs/day: 0.90  . Years: 40.00  . Pack years: 36.00  . Types: Cigarettes  . Last attempt to quit: 07/19/2004  . Years since quitting: 13.8  Smokeless Tobacco Never Used    Labs: Recent Review [facility] Cardiac and Pul[facility] Rehab  Latest Ref Rng & Units 07/20/2011 07/02/2017 07/03/2017   Cholestrol 0 - 200 mg/dL - - 219(H)   LDLCALC 0 - 99 mg/dL - - 131(H)   HDL >40 mg/dL - - 34(L)   Trlycerides <150 mg/dL - - 272(H)   Hemoglobin A1c 4.8 - 5.6 % - 5.9(H) -   TCO2 0 - 100 mmol/L 28 - -       Exercise Target Goals: Exercise Program Goal: Individual exercise prescription set using results from initial 6 min walk test and THRR while considering  patient's activity barriers and safety.   Exercise Prescription Goal: Initial exercise prescription builds to 30-45 minutes a day of aerobic activity, 2-3 days per week.  Home exercise guidelines will be given to patient during program as part of exercise prescription that the participant will acknowledge.  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 hip pain with walking   Cardiac Risk Stratification  High       6 Minute Walk: 6 Minute Walk    Row Name 03/21/18 1416         6 Minute Walk   Phase  Initial     Distance  1266 feet     Walk Time  6 minutes     # of Rest Breaks  0     MPH  2.4     METS  3.03     RPE  13     Perceived Dyspnea   2     VO2 Peak  10.59     Symptoms  Yes (comment)     Comments  hip pain at end 6/10, SOB     Resting HR  83 bpm     Resting BP  124/64     Resting Oxygen Saturation   96 %     Exercise Oxygen Saturation  during 6 min walk  94 %     Max Ex. HR  96 bpm     Max Ex. BP  156/84     2 Minute Post BP  126/74        Oxygen Initial Assessment:   Oxygen Re-Evaluation:   Oxygen Discharge (Final Oxygen Re-Evaluation):   Initial Exercise Prescription: Initial Exercise Prescription - 03/21/18 1400      Date of Initial Exercise RX and Referring Provider   Date  03/21/18    Referring Provider  Landry Corporal MD      Treadmill   MPH  2.3    Grade  0.5    Minutes  15    METs  2.92      REL-XR   Level  2    Speed  50    Minutes  15    METs  2.9      T5 Nustep   Level  2    SPM  80    Minutes  15    METs  2.9      Prescription Details   Frequency (times per week)  2    Duration  Progress to 30 minutes of continuous aerobic without signs/symptoms of physical distress      Intensity   THRR 40-80% of Max Heartrate  110-137    Ratings of Perceived Exertion  11-13  Perceived Dyspnea  0-4      Progression   Progression  Continue to progress workloads to maintain intensity without signs/symptoms of physical distress.      Resistance Training   Training Prescription  Yes    Weight  4 lbs    Reps  10-15       Perform  Capillary Blood Glucose checks as needed.  Exercise Prescription Changes: Exercise Prescription Changes    Row Name 03/21/18 1400 03/31/18 1100 04/07/18 1000 04/12/18 1700 04/27/18 1100     Response to Exercise   Blood Pressure (Admit)  124/64  118/62  -  98/52  106/56   Blood Pressure (Exercise)  156/84  140/86  -  162/60  146/60   Blood Pressure (Exit)  126/74  126/84  -  124/62  134/58   Heart Rate (Admit)  83 bpm  77 bpm  -  69 bpm  69 bpm   Heart Rate (Exercise)  96 bpm  94 bpm  -  108 bpm  99 bpm   Heart Rate (Exit)  59 bpm  75 bpm  -  68 bpm  68 bpm   Oxygen Saturation (Admit)  96 %  -  -  -  -   Oxygen Saturation (Exercise)  94 %  -  -  -  -   Rating of Perceived Exertion (Exercise)  13  13  -  12  13   Perceived Dyspnea (Exercise)  2  -  -  -  -   Symptoms  hip pain 6/10, SOB  none  -  none  none   Comments  walk test results  1st full week of exercise   -  -  -   Duration  -  Continue with 30 min of aerobic exercise without signs/symptoms of physical distress.  -  Continue with 30 min of aerobic exercise without signs/symptoms of physical distress.  Continue with 30 min of aerobic exercise without signs/symptoms of physical distress.   Intensity  -  THRR unchanged  -  THRR unchanged  THRR unchanged     Progression   Progression  -  Continue to progress workloads to maintain intensity without signs/symptoms of physical distress.  -  Continue to progress workloads to maintain intensity without signs/symptoms of physical distress.  Continue to progress workloads to maintain intensity without signs/symptoms of physical distress.   Average METs  -  2.2  -  2.94  3.17     Resistance Training   Training Prescription  -  Yes  -  Yes  Yes   Weight  -  4 lbs  -  4 lbs  4 lbs   Reps  -  10-15  -  10-15  10-15     Interval Training   Interval Training  -  -  -  No  No     Treadmill   MPH  -  2.3  -  2.3  2.3   Grade  -  0.5  -  0.5  0.5   Minutes  -  15  -  15  15   METs  -  2.92   -  2.92  2.92     REL-XR   Level  -  2  -  2  4   Minutes  -  15  -  15  15   METs  -  2.6  -  3.8  4     T5  Nustep   Level  -  2  -  2  2   Minutes  -  15  -  15  15   METs  -  2.9  -  2.8  2.4     Home Exercise Plan   Plans to continue exercise at  -  -  Home (comment) walking  Home (comment) walking  Home (comment) walking   Frequency  -  -  Add 3 additional days to program exercise sessions.  Add 3 additional days to program exercise sessions.  Add 3 additional days to program exercise sessions.   Initial Home Exercises Provided  -  -  04/07/18  04/07/18  04/07/18   Row Name 05/10/18 1500             Response to Exercise   Blood Pressure (Admit)  132/70       Blood Pressure (Exercise)  148/58       Blood Pressure (Exit)  114/56       Heart Rate (Admit)  50 bpm       Heart Rate (Exercise)  95 bpm       Heart Rate (Exit)  75 bpm       Rating of Perceived Exertion (Exercise)  13       Symptoms  none       Duration  Continue with 30 min of aerobic exercise without signs/symptoms of physical distress.       Intensity  THRR unchanged         Progression   Progression  Continue to progress workloads to maintain intensity without signs/symptoms of physical distress.       Average METs  3.7         Resistance Training   Training Prescription  Yes       Weight  4 lbs       Reps  10-15         Interval Training   Interval Training  No         Treadmill   MPH  2.3       Grade  2       Minutes  15       METs  3.4         REL-XR   Level  6       Minutes  15       METs  5.2         T5 Nustep   Level  4       Minutes  15       METs  2.5         Home Exercise Plan   Plans to continue exercise at  Home (comment) walking       Frequency  Add 3 additional days to program exercise sessions.       Initial Home Exercises Provided  04/07/18          Exercise Comments: Exercise Comments    Row Name 03/29/18 8850           Exercise Comments  First full day of  exercise!  Patient was oriented to gym and equipment including functions, settings, policies, and procedures.  Patient's individual exercise prescription and treatment plan were reviewed.  All starting workloads were established based on the results of the 6 minute walk test done at initial orientation visit.  The plan for exercise progression was also introduced and progression will be customized based on  patient's performance and goals.          Exercise Goals and Review: Exercise Goals    Row Name 03/21/18 1421             Exercise Goals   Increase Physical Activity  Yes       Intervention  Provide advice, education, support and counseling about physical activity/exercise needs.;Develop an individualized exercise prescription for aerobic and resistive training based on initial evaluation findings, risk stratification, comorbidities and participant's personal goals.       Expected Outcomes  Short Term: Attend rehab on a regular basis to increase amount of physical activity.;Long Term: Add in home exercise to make exercise part of routine and to increase amount of physical activity.;Long Term: Exercising regularly at least 3-5 days a week.       Increase Strength and Stamina  Yes       Intervention  Provide advice, education, support and counseling about physical activity/exercise needs.;Develop an individualized exercise prescription for aerobic and resistive training based on initial evaluation findings, risk stratification, comorbidities and participant's personal goals.       Expected Outcomes  Short Term: Increase workloads from initial exercise prescription for resistance, speed, and METs.;Long Term: Improve cardiorespiratory fitness, muscular endurance and strength as measured by increased METs and functional capacity (6MWT);Short Term: Perform resistance training exercises routinely during rehab and add in resistance training at home       Able to understand and use rate of perceived  exertion (RPE) scale  Yes       Intervention  Provide education and explanation on how to use RPE scale       Expected Outcomes  Short Term: Able to use RPE daily in rehab to express subjective intensity level;Long Term:  Able to use RPE to guide intensity level when exercising independently       Able to understand and use Dyspnea scale  Yes       Intervention  Provide education and explanation on how to use Dyspnea scale       Expected Outcomes  Short Term: Able to use Dyspnea scale daily in rehab to express subjective sense of shortness of breath during exertion;Long Term: Able to use Dyspnea scale to guide intensity level when exercising independently       Knowledge and understanding of Target Heart Rate Range (THRR)  Yes       Intervention  Provide education and explanation of THRR including how the numbers were predicted and where they are located for reference       Expected Outcomes  Short Term: Able to state/look up THRR;Long Term: Able to use THRR to govern intensity when exercising independently;Short Term: Able to use daily as guideline for intensity in rehab       Able to check pulse independently  Yes       Intervention  Provide education and demonstration on how to check pulse in carotid and radial arteries.;Review the importance of being able to check your own pulse for safety during independent exercise       Expected Outcomes  Short Term: Able to explain why pulse checking is important during independent exercise;Long Term: Able to check pulse independently and accurately       Understanding of Exercise Prescription  Yes       Intervention  Provide education, explanation, and written materials on patient's individual exercise prescription       Expected Outcomes  Short Term: Able to explain program exercise prescription;Long  Term: Able to explain home exercise prescription to exercise independently          Exercise Goals Re-Evaluation : Exercise Goals Re-Evaluation    [place] Name  03/29/18 1045 03/31/18 1157 04/07/18 0957 04/12/18 1700 04/27/18 1136     Exercise Goal Re-Evaluation   Exercise Goals Review  Increase Physical Activity;Able to understand and use rate of perceived exertion (RPE) scale;Knowledge and understanding of Target Heart Rate Range (THRR);Understanding of Exercise Prescription;Increase Strength and Stamina;Able to check pulse independently  Increase Physical Activity;Able to understand and use rate of perceived exertion (RPE) scale;Knowledge and understanding of Target Heart Rate Range (THRR);Understanding of Exercise Prescription;Increase Strength and Stamina;Able to check pulse independently  Increase Physical Activity;Increase Strength and Stamina;Understanding of Exercise Prescription  Increase Physical Activity;Increase Strength and Stamina;Understanding of Exercise Prescription  Increase Physical Activity;Increase Strength and Stamina;Understanding of Exercise Prescription   Comments  Reviewed RPE scale, THR and program prescription with pt today.  Pt voiced understanding and was given a copy of goals to take home.   First full week of exercise for CD. He is doing well in rehab so far and getting the hang of using all the equipment. We will continue to monitor and progress him as tolerated.   CD has been doing well in rehab.  His biggest limitation is the pain that he gets in his hips.  He says that if he was at home he would quit but pushes through it here.  Reviewed home exercise with pt today.  Pt plans to walk at home for exercise. 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 Reviewed THR, pulse, RPE, sign and symptoms, NTG use, and when to call 911 or MD.  Also discussed weather considerations and indoor options.  Pt voiced understanding.  CD continues to do well in rehab.  He is up to 3.8 METs on the XR.  We will continue to monitor his progress.   CD has been doing well in rehab.  He continues to  make improvements.  He is up to 4.0 METs on the XR now.  We will continue to monitor his progression.    Expected Outcomes  Short: Use RPE daily to regulate intensity. Long: Follow program prescription in THR.  Short: become comfortable using machines on his own. Long: increase overall MET level   Short: Start add in more walking at home.  Long: Continue to increase strength and stamina.   Short: Increase workloads on seated equipment.  Long: Continue to add in home exercise.   Short: Increase NuStep.  Long: Continue to exercies more at home.    [place] Name 05/05/18 1004 05/10/18 1543           Exercise Goal Re-Evaluation   Exercise Goals Review  Increase Physical Activity;Increase Strength and Stamina;Understanding of Exercise Prescription  Increase Physical Activity;Increase Strength and Stamina;Understanding of Exercise Prescription      Comments  CD is doing well in rehab.  He is starting to feel his strength and stamina are getting better.  He is able to increase workloads in class but is still tired and not doing his home exercise.  We talked about making sure he is getting at least 3 days a week for exercise. He is going to try to get his one extra day a week.   CD continues to do well in rehab.  He is up to level 6 now on the XR and level  4 on the T5 NuStep.  We will continue to monitor his progression.       Expected Outcomes  Short: Add in at least one extra day a week at home.  Long: Continue to exercise at home.   Short: Continue to get in more exercise at home.  Long: Continue to increase strength and stamina.          Discharge Exercise Prescription (Final Exercise Prescription Changes): Exercise Prescription Changes - 05/10/18 1500      Response to Exercise   Blood Pressure (Admit)  132/70    Blood Pressure (Exercise)  148/58    Blood Pressure (Exit)  114/56    Heart Rate (Admit)  50 bpm    Heart Rate (Exercise)  95 bpm    Heart Rate (Exit)  75 bpm    Rating of Perceived Exertion  (Exercise)  13    Symptoms  none    Duration  Continue with 30 min of aerobic exercise without signs/symptoms of physical distress.    Intensity  THRR unchanged      Progression   Progression  Continue to progress workloads to maintain intensity without signs/symptoms of physical distress.    Average METs  3.7      Resistance Training   Training Prescription  Yes    Weight  4 lbs    Reps  10-15      Interval Training   Interval Training  No      Treadmill   MPH  2.3    Grade  2    Minutes  15    METs  3.4      REL-XR   Level  6    Minutes  15    METs  5.2      T5 Nustep   Level  4    Minutes  15    METs  2.5      Home Exercise Plan   Plans to continue exercise at  Home (comment)   walking   Frequency  Add 3 additional days to program exercise sessions.    Initial Home Exercises Provided  04/07/18       Nutrition:  Target Goals: Understanding of nutrition guidelines, daily intake of sodium <1575m, cholesterol <2036m calories 30% from fat and 7% or less from saturated fats, daily to have 5 or more servings of fruits and vegetables.  Biometrics: Pre Biometrics - 03/21/18 1421      Pre Biometrics   Height  5' 10.4" (1.788 m)    Weight  194 lb 12.8 oz (88.4 kg)    Waist Circumference  37 inches    Hip Circumference  41 inches    Waist to Hip Ratio  0.9 %    BMI (Calculated)  27.64    Single Leg Stand  1.89 seconds        Nutrition Therapy Plan and Nutrition Goals: Nutrition Therapy & Goals - 03/29/18 1206      Nutrition Therapy   Diet  TLC    Protein (specify units)  120z    Fiber  30 grams    Whole Grain Foods  3 servings   he is considering trying whole grain bread   Saturated Fats  15 max. grams    Fruits and Vegetables  5 servings/day   8 ideal, does not have a big appetite overall   Sodium  1500 grams      Personal Nutrition Goals   Nutrition Goal  Increase you weekly  fruit and vegetable intake    Personal Goal #2  Practice eating on a  regular schedule. This may help stimulate appetite, in addition to encouraging adequate calorie intake    Personal Goal #3  Try not to drink with meals, aside from the liquid you need in order to encourage safe swallowing, so you do not fill up on fluids instead of food    Comments  He c/o altered taste of food and decreased appetite since being put on several new medications. This has resulted in wt loss of approx 7-10# in a couple of months      Intervention Plan   Intervention  Prescribe, educate and counsel regarding individualized specific dietary modifications aiming towards targeted core components such as weight, hypertension, lipid management, diabetes, heart failure and other comorbidities.    Expected Outcomes  Short Term Goal: A plan has been developed with personal nutrition goals set during dietitian appointment.;Short Term Goal: Understand basic principles of dietary content, such as calories, fat, sodium, cholesterol and nutrients.;Long Term Goal: Adherence to prescribed nutrition plan.       Nutrition Assessments: Nutrition Assessments - 03/21/18 1357      MEDFICTS Scores   Pre Score  43       Nutrition Goals Re-Evaluation: Nutrition Goals Re-Evaluation    Row Name 03/29/18 1219 04/07/18 0958 04/28/18 1039         Goals   Current Weight  -  190 lb (86.2 kg)  -     Nutrition Goal  Practice eating on a regular schedule. This may help stimulate appetite, in addition to encouraging adequate calorie intake  Eat on a regular schedule, not skip meals, more fruits and vegetables, limit fluids during meals.   Try to eat on a regular/ consistent schedule as much as possible and do not skip meals. Increase fruit and vegetable intake and limit fluid intake with meals aside from what is required for safe swallowing     Comment  He c/o decreased appetite and altered taste of food s/p hospitalization and new medication prescriptions  CD is not able to eat on a regular schedule.  He is  trying to get better about not skipping meals.  He is aiming for two meals a day.  He usually eats some time between 10-11 and then in the evening.  His daughter in law is the one doing the cooking.  He is not eating more fruits and vegetables yet but he will try to add that it in.   He has started to eat an additional snack on exercise days after he gets back home. Still eating 2 meals/day on most day with the occasional snack between meals. C/o low energy. He has started to eat fruit more regularly IE apples     Expected Outcome  He will not skip meals, and eat snacks that contain protein and healthy fats between meals to help meet his daily calorie needs and prevent further weight loss  Short: Add in fruits and vegetables. Make sure eating at least two meals a day.  Long: Continue to work on heart healthy eating.   He will continue to eat fruit more regularly and continue to work on increasing vegetable intake. Long term: eat 2 meals and 1 snack per day minimum       Personal Goal #2 Re-Evaluation   Personal Goal #2  Increase your weekly fruit and vegetable intake  -  -       Personal Goal #3 Re-Evaluation  Personal Goal #3  Try not to drink with meals, aside from the liquid you need in order to encourage safe swallowing, so you do not fill up on liquids instead of food  -  -        Nutrition Goals Discharge (Final Nutrition Goals Re-Evaluation): Nutrition Goals Re-Evaluation - 04/28/18 1039      Goals   Nutrition Goal  Try to eat on a regular/ consistent schedule as much as possible and do not skip meals. Increase fruit and vegetable intake and limit fluid intake with meals aside from what is required for safe swallowing    Comment  He has started to eat an additional snack on exercise days after he gets back home. Still eating 2 meals/day on most day with the occasional snack between meals. C/o low energy. He has started to eat fruit more regularly IE apples    Expected Outcome  He will  continue to eat fruit more regularly and continue to work on increasing vegetable intake. Long term: eat 2 meals and 1 snack per day minimum       Psychosocial: Target Goals: Acknowledge presence or absence of significant depression and/or stress, maximize coping skills, provide positive support system. Participant is able to verbalize types and ability to use techniques and skills needed for reducing stress and depression.   Initial Review & Psychosocial Screening: Initial Psych Review & Screening - 03/21/18 1354      Initial Review   Current issues with  Current Depression;History of Depression;Current Psychotropic Meds;Current Stress Concerns    Source of Stress Concerns  Unable to perform yard/household activities      [place]?  Yes   spouse     Barriers   Psychosocial barriers to participate in program  There are no identifiable barriers or psychosocial needs.;The patient should benefit from training in stress management and relaxation.      Screening Interventions   Interventions  Encouraged to exercise;Program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

## 2018-05-19 ENCOUNTER — Encounter: Payer: Medicare Other | Admitting: *Deleted

## 2018-05-19 VITALS — Ht 70.4 in | Wt 192.0 lb

## 2018-05-19 DIAGNOSIS — Z48812 Encounter for surgical aftercare following surgery on the circulatory system: Secondary | ICD-10-CM | POA: Diagnosis not present

## 2018-05-19 DIAGNOSIS — Z955 Presence of coronary angioplasty implant and graft: Secondary | ICD-10-CM

## 2018-05-19 DIAGNOSIS — I214 Non-ST elevation (NSTEMI) myocardial infarction: Secondary | ICD-10-CM

## 2018-05-19 NOTE — Progress Notes (Signed)
Daily Session Note  Patient Details  Name: Clayton Anderson MRN: [id number] Date of Birth: 06-08-1946 Referring Provider:     Cardiac Rehab from 03/21/2018 in [facility] Cardiac and Pulmonary Rehab  Referring Provider  Landry Corporal MD      Encounter Date: 05/19/2018  Check In: Session Check In - 05/19/18 0908      Check-In   Supervising physician immediately available to respond to emergencies  See telemetry face sheet for immediately available ER MD    Location  [facility]-Cardiac & Pulmonary Rehab    Staff Present  Alberteen Sam, MA, RCEP, CCRP, Exercise Physiologist;Joseph Sunflower [employer];Vida Rigger RN, BSN;Carroll Enterkin, RN, BSN    Medication changes reported      No    Fall or balance concerns reported     No    Warm-up and Cool-down  Performed on first and last piece of Teacher, music Performed  Yes    VAD Patient?  No    PAD/SET Patient?  No      Pain Assessment   Currently in Pain?  No/denies          Social History   Tobacco Use  Smoking Status Former Smoker  . Packs/day: 0.90  . Years: 40.00  . Pack years: 36.00  . Types: Cigarettes  . Last attempt to quit: 07/19/2004  . Years since quitting: 13.8  Smokeless Tobacco Never Used    Goals Met:  Independence with exercise equipment Exercise tolerated well No report of cardiac concerns or symptoms Strength training completed today  Goals Unmet:  Not Applicable  Comments: Pt able to follow exercise prescription today without complaint.  Will continue to monitor for progression.  [place] Name 03/21/18 1416 05/19/18 1023       6 Minute Walk   Phase  Initial  Discharge    Distance  1266 feet  1442 feet    Distance % Change  -  13.9 %    Distance Feet Change  -  176 ft    Walk Time  6 minutes  6 minutes    # of Rest Breaks  0  0    MPH  2.4  2.73    METS  3.03  3.24    RPE  13  15    Perceived Dyspnea   2  1    VO2 Peak  10.59  11.33    Symptoms  Yes (comment)  No     Comments  hip pain at end 6/10, SOB  -    Resting HR  83 bpm  78 bpm    Resting BP  124/64  102/60    Resting Oxygen Saturation   96 %  -    Exercise Oxygen Saturation  during 6 min walk  94 %  -    Max Ex. HR  96 bpm  93 bpm    Max Ex. BP  156/84  144/64    2 Minute Post BP  126/74  -       Dr. Emily Filbert is Medical Director for [place] and [facility].

## 2018-05-24 ENCOUNTER — Encounter: Payer: Medicare Other | Admitting: *Deleted

## 2018-05-24 DIAGNOSIS — Z955 Presence of coronary angioplasty implant and graft: Secondary | ICD-10-CM

## 2018-05-24 DIAGNOSIS — I214 Non-ST elevation (NSTEMI) myocardial infarction: Secondary | ICD-10-CM

## 2018-05-24 DIAGNOSIS — Z48812 Encounter for surgical aftercare following surgery on the circulatory system: Secondary | ICD-10-CM | POA: Diagnosis not present

## 2018-05-24 NOTE — Progress Notes (Signed)
Daily Session Note  Patient Details  Name: Clayton Anderson MRN: [id number] Date of Birth: May 16, 1946 Referring Provider:     Cardiac Rehab from 03/21/2018 in [facility] Cardiac and Pulmonary Rehab  Referring Provider  Landry Corporal MD      Encounter Date: 05/24/2018  Check In: Session Check In - 05/24/18 0915      Check-In   Supervising physician immediately available to respond to emergencies  See telemetry face sheet for immediately available ER MD    Location  [facility]-Cardiac & Pulmonary Rehab    Staff Present  Nyoka Cowden, RN, BSN, Cheri Fowler, RN BSN;Jessica Luan Pulling, MA, RCEP, CCRP, Exercise Physiologist    Medication changes reported      No    Fall or balance concerns reported     No    Warm-up and Cool-down  Performed on first and last piece of equipment    Resistance Training Performed  Yes    VAD Patient?  No    PAD/SET Patient?  No      Pain Assessment   Currently in Pain?  No/denies          Social History   Tobacco Use  Smoking Status Former Smoker  . Packs/day: 0.90  . Years: 40.00  . Pack years: 36.00  . Types: Cigarettes  . Last attempt to quit: 07/19/2004  . Years since quitting: 13.8  Smokeless Tobacco Never Used    Goals Met:  Independence with exercise equipment Exercise tolerated well No report of cardiac concerns or symptoms Strength training completed today  Goals Unmet:  Not Applicable  Comments: Pt able to follow exercise prescription today without complaint.  Will continue to monitor for progression.    Dr. Emily Filbert is Medical Director for [place] and [facility].

## 2018-05-24 NOTE — Progress Notes (Signed)
Clayton Anderson, Clayton Anderson    Date of visit:  02/22/2018 DOB:  1946-04-16    Age:  72 yrs. Medical record number:  [id number]     Account number:  [id number] Primary Care Provider: Barnabas Lister ____________________________ CURRENT DIAGNOSES  1. CAD Native without angina  2. Hypertensive heart disease without heart failure  3. Presence of coronary stent  4. Hyperlipidemia  5. Encounter for preprocedural cardiovascular examination ____________________________ ALLERGIES  Sulfa (Sulfonamides), Intolerance-unknown  Tetanus Toxoid, Intolerance-unknown ____________________________ MEDICATIONS  1. amlodipine 10 mg tablet, 1 p.o. daily  2. aspirin 81 mg Tablet, Chewable, 1 p.o. q.d.  3. atorvastatin 40 mg tablet, 1 p.o. daily  4. bupropion HCl XL 150 mg 24 hr tablet, extended release, 1 p.o. daily  5. carvedilol 3.125 mg tablet, BID  6. clopidogrel 75 mg tablet, 1 p.o. daily  7. duloxetine 60 mg capsule,delayed release, 2 p.o. daily  8. folic acid 053 mcg tablet, 1 p.o. daily  9. gabapentin 300 mg capsule, 2 p.o. BID  10. Lexapro 20 mg tablet, 1/2 tab daily  11. nitroglycerin 0.4 mg tablet, sublingual, PRN  12. omeprazole 40 mg capsule,delayed release, 1 p.o. daily  13. ondansetron 8 mg disintegrating tablet, PRN  14. oxycodone 20 mg tablet, PRN  15. Ramipril 10 mg Capsule, 1 p.o. daily  16. rosuvastatin 40 mg tablet, 1 p.o. daily  17. sennosides 8.8 mg/5 mL oral syrup, PRN  18. spironolactone 25 mg tablet, 1/2 tab daily  19. tizanidine 2 mg tablet, QID PRN ____________________________ CHIEF COMPLAINTS  Followup of CAD Native without angina  Followup of Encounter for preprocedural cardiovascular examination ____________________________ HISTORY OF PRESENT ILLNESS Patient returns early for preoperative cardiac evaluation. We received a request for him to have his Plavix and aspirin stopped in anticipation of the cervical spine injection. He denies angina and has no PND orthopnea or edema. He did  have a drug-eluting stent placed that was complicated by an edge dissection in November of last year so it has not been quite a year since the procedure. He has not yet gotten involved in cardiac rehabilitation. He also reports nonhealing of the previous ankle injury and wonders if he needs to have a procedure done there. He has no PND, orthopnea, or claudication. He states to me that the pain in his neck is so bad that he does not feel he can wait the full year to have his injections prior to stopping the Plavix. ____________________________ PAST HISTORY  Past Medical Illnesses:  hyperlipidemia, peripheral vascular disease, empty sella syndrome, GERD, hypertension, mild obesity, cervical disc disease, restless legs;  Cardiovascular Illnesses:  CAD;  Surgical Procedures:  neck surg for whiplash injury, shoulder surgery-left x3, cervical laminectomy x 3;  NYHA Classification:  I;  Canadian Angina Classification:  Class 0: Asymptomatic;  Cardiology Procedures-Invasive:  cardiac cath (left), stent LAD May 2001, PTCA OM 09 Dec 1999, cardiac cath (left) December 2012, cardiac cath (left) December 2019, stent LAD with repeat stent for acut eclosure and dissection 12/19;  Cardiology Procedures-Noninvasive:  treadmill cardiolite July 2009;  Cardiac Cath Results:  normal Left main, 75% ostial LAD, 99% restenoiss of prior stent, Patent PTCA site with 30% stenosis, scattered irregularities RCA;  LVEF of 60% documented via echocardiogram on 08/15/2017,   ____________________________ CARDIO-PULMONARY TEST DATES EKG Date:  02/22/2018;   Cardiac Cath Date:  07/19/2011;  Stent Placement Date: 01/06/2000;  Nuclear Study Date:  02/23/2008;  Echocardiography Date: 07/19/2011;  Chest Xray Date: 07/18/2011;   ____________________________ FAMILY HISTORY Brother -- Brother  dead, Congenital heart disease Brother -- Brother alive with problem, Chronic obstructive lung disease Father -- Father dead, CVA Mother -- Mother dead,  CVA Sister -- Sister alive and well ____________________________ SOCIAL HISTORY Alcohol Use:  socially;  Smoking:  used to smoke but quit 2005, 40 pack year history;  Diet:  regular diet;  Lifestyle:  married;  Exercise:  no regular exercise;  Occupation:  retired Astronomer;  Residence:  lives with wife;   ____________________________ REVIEW OF SYSTEMS General:  no change in weight Eyes: denies diplopia, history of glaucoma or visual problems. Respiratory: dyspnea with exertion Cardiovascular:  please review HPI Abdominal: constipation Genitourinary-Male: erectile dysfunction, nocturia  Musculoskeletal:  arthritis of the neck, chronic low back pain, arthritis of the left shoulder Neurological:  denies headaches, stroke, or TIA  ____________________________ PHYSICAL EXAMINATION VITAL SIGNS  Blood Pressure:  114/64 Sitting, Right arm, large cuff  , 110/50 Supine, Right arm and large cuff   Pulse:  72/min. Weight:  188.00 lbs. Height:  71.00"BMI: 26  Constitutional:  pleasant white male in no acute distress Skin:  warm and dry to touch, no apparent skin lesions, or masses noted. Head:  normocephalic, normal hair pattern, no masses or tenderness Neck:  supple, without massess. No JVD, thyromegaly or carotid bruits. Carotid upstroke normal. Chest:  normal symmetry, clear to auscultation. Cardiac:  regular rhythm, normal S1 and S2, No S3 or S4, no murmurs, gallops or rubs detected. Peripheral Pulses:  the femoral,dorsalis pedis, and posterior tibial pulses are full and equal bilaterally with no bruits auscultated. Extremities & Back:  no spinal abnormalities noted., normal muscle strength and tone., no edema present Neurological:  no gross motor or sensory deficits noted, affect appropriate, oriented x3. ____________________________ MOST RECENT LIPID PANEL 11/12/17  CHOL TOTL 130 mg/dl, LDL 67 NM, HDL 39 mg/dl, TRIGLYCER 119 mg/dl, ALT 8 u/l, ALK PHOS 59 u/l, CHOL/HDL 3.3 (Calc) and AST 13  u/l ____________________________ IMPRESSIONS/PLAN  1. From a cardiovascular viewpoint is acceptable to proceed with the planned cervical spine injection and if he needed to have repair of his ankle he could although would be preferable to wait for a year. It has been greater than 6 months since his procedure on his coronary arteries and so could discontinue the Plavix 7 days prior to the injection and restart following the injection. He understands ideally that 12 months is recommended period of time but would need to weigh the benefit of pain relief versus the potential risks. I would recommend that he go back on Plavix following the procedure because of the double stents and previous edge dissection. 2. CAD 3. Hyperlipidemia  Recommendations:  See as above. Keep regular appointment. Call if problems. Twelve-lead EKG personally reviewed by me shows normal sinus rhythm and is normal. ____________________________ TODAYS ORDERS  1. 12 Lead EKG: Today  2. Cardiac Rehab:                        ____________________________ Cardiology Physician:  Kerry Hough MD [facility]

## 2018-05-31 DIAGNOSIS — Z48812 Encounter for surgical aftercare following surgery on the circulatory system: Secondary | ICD-10-CM | POA: Diagnosis not present

## 2018-05-31 DIAGNOSIS — Z955 Presence of coronary angioplasty implant and graft: Secondary | ICD-10-CM

## 2018-05-31 DIAGNOSIS — I214 Non-ST elevation (NSTEMI) myocardial infarction: Secondary | ICD-10-CM

## 2018-05-31 NOTE — Progress Notes (Signed)
Daily Session Note  Patient Details  Name: Clayton Anderson MRN: [id number] Date of Birth: [birth year] Referring Provider:     Cardiac Rehab from 03/21/2018 in [facility] Cardiac and Pulmonary Rehab  Referring Provider  Landry Corporal MD      Encounter Date: 05/31/2018  Check In: Session Check In - 05/31/18 0926      Check-In   Supervising physician immediately available to respond to emergencies  See telemetry face sheet for immediately available ER MD    Location  [facility]-Cardiac & Pulmonary Rehab    Staff Present  Heath Lark, RN, BSN, CCRP;Laureen Owens Shark, BS, RRT, Respiratory Lennie Hummer, MA, RCEP, CCRP, Exercise Physiologist    Medication changes reported      No    Fall or balance concerns reported     No    Warm-up and Cool-down  Performed on first and last piece of equipment    Resistance Training Performed  Yes    VAD Patient?  No    PAD/SET Patient?  No      Pain Assessment   Currently in Pain?  No/denies          Social History   Tobacco Use  Smoking Status Former Smoker  . Packs/day: 0.90  . Years: 40.00  . Pack years: 36.00  . Types: Cigarettes  . Last attempt to quit: 07/19/2004  . Years since quitting: 13.8  Smokeless Tobacco Never Used    Goals Met:  Independence with exercise equipment Exercise tolerated well No report of cardiac concerns or symptoms Strength training completed today  Goals Unmet:  Not Applicable  Comments: Pt able to follow exercise prescription today without complaint.  Will continue to monitor for progression.    Dr. Emily Filbert is Medical Director for [place] and [facility].

## 2018-05-31 NOTE — Patient Instructions (Signed)
Discharge Patient Instructions  Patient Details  Name: Clayton Anderson MRN: [id number] Date of Birth: 10/07/1945 Referring Provider:  Jacolyn Reedy, MD   Number of Visits: 36  Reason for Discharge:  Patient reached a stable level of exercise. Patient independent in their exercise. Patient has met program and personal goals.  Smoking History:  Social History   Tobacco Use  Smoking Status Former Smoker  . Packs/day: 0.90  . Years: 40.00  . Pack years: 36.00  . Types: Cigarettes  . Last attempt to quit: 07/19/2004  . Years since quitting: 13.8  Smokeless Tobacco Never Used    Diagnosis:  NSTEMI (non-ST elevated myocardial infarction) ([place])  Status post coronary artery stent placement  Initial Exercise Prescription: Initial Exercise Prescription - 03/21/18 1400      Date of Initial Exercise RX and Referring Provider   Date  03/21/18    Referring Provider  Landry Corporal MD      Treadmill   MPH  2.3    Grade  0.5    Minutes  15    METs  2.92      REL-XR   Level  2    Speed  50    Minutes  15    METs  2.9      T5 Nustep   Level  2    SPM  80    Minutes  15    METs  2.9      Prescription Details   Frequency (times per week)  2    Duration  Progress to 30 minutes of continuous aerobic without signs/symptoms of physical distress      Intensity   THRR 40-80% of Max Heartrate  110-137    Ratings of Perceived Exertion  11-13    Perceived Dyspnea  0-4      Progression   Progression  Continue to progress workloads to maintain intensity without signs/symptoms of physical distress.      Resistance Training   Training Prescription  Yes    Weight  4 lbs    Reps  10-15       Discharge Exercise Prescription (Final Exercise Prescription Changes): Exercise Prescription Changes - 05/24/18 1100      Response to Exercise   Blood Pressure (Admit)  110/60    Blood Pressure (Exercise)  136/74    Blood Pressure (Exit)  110/64    Heart Rate (Admit)  66 bpm     Heart Rate (Exercise)  116 bpm    Heart Rate (Exit)  65 bpm    Rating of Perceived Exertion (Exercise)  13    Symptoms  none    Duration  Continue with 30 min of aerobic exercise without signs/symptoms of physical distress.    Intensity  THRR unchanged      Progression   Progression  Continue to progress workloads to maintain intensity without signs/symptoms of physical distress.    Average METs  3.87      Resistance Training   Training Prescription  Yes    Weight  4 lbs    Reps  10-15      Interval Training   Interval Training  No      Treadmill   MPH  2.3    Grade  2    Minutes  15    METs  3.4      REL-XR   Level  7    Minutes  15    METs  5.7  T5 Nustep   Level  4    Minutes  15    METs  2.5      Home Exercise Plan   Plans to continue exercise at  Home (comment)   walking   Frequency  Add 3 additional days to program exercise sessions.    Initial Home Exercises Provided  04/07/18       Functional Capacity: 6 Minute Walk    Row Name 03/21/18 1416 05/19/18 1023       6 Minute Walk   Phase  Initial  Discharge    Distance  1266 feet  1442 feet    Distance % Change  -  13.9 %    Distance Feet Change  -  176 ft    Walk Time  6 minutes  6 minutes    # of Rest Breaks  0  0    MPH  2.4  2.73    METS  3.03  3.24    RPE  13  15    Perceived Dyspnea   2  1    VO2 Peak  10.59  11.33    Symptoms  Yes (comment)  No    Comments  hip pain at end 6/10, SOB  -    Resting HR  83 bpm  78 bpm    Resting BP  124/64  102/60    Resting Oxygen Saturation   96 %  -    Exercise Oxygen Saturation  during 6 min walk  94 %  -    Max Ex. HR  96 bpm  93 bpm    Max Ex. BP  156/84  144/64    2 Minute Post BP  126/74  -       Quality of Life: Quality of Life - 05/24/18 0947      Quality of Life   Select  Quality of Life      Quality of Life Scores   Health/Function Pre  14.73 %    Health/Function Post  16.11 %    Health/Function % Change  9.37 %    Socioeconomic  Pre  21.92 %    Socioeconomic Post  21.21 %    Socioeconomic % Change   -3.24 %    Psych/Spiritual Pre  13.33 %    Psych/Spiritual Post  14.93 %    Psych/Spiritual % Change  12 %    Family Pre  17.3 %    Family Post  23.7 %    Family % Change  36.99 %    GLOBAL Pre  16.22 %    GLOBAL Post  18.09 %    GLOBAL % Change  11.53 %       Personal Goals: Goals established at orientation with interventions provided to work toward goal. Personal Goals and Risk Factors at Admission - 03/21/18 1347      Core Components/Risk Factors/Patient Goals on Admission    Weight Management  Yes;Weight Loss    Intervention  Weight Management: Provide education and appropriate resources to help participant work on and attain dietary goals.;Weight Management: Develop a combined nutrition and exercise program designed to reach desired caloric intake, while maintaining appropriate intake of nutrient and fiber, sodium and fats, and appropriate energy expenditure required for the weight goal.;Weight Management/Obesity: Establish reasonable short term and long term weight goals.    Admit Weight  194 lb (88 kg)    Goal Weight: Short Term  190 lb (86.2 kg)  Goal Weight: Long Term  180 lb (81.6 kg)    Expected Outcomes  Short Term: Continue to assess and modify interventions until short term weight is achieved;Long Term: Adherence to nutrition and physical activity/exercise program aimed toward attainment of established weight goal;Weight Loss: Understanding of general recommendations for a balanced deficit meal plan, which promotes 1-2 lb weight loss per week and includes a negative energy balance of [phone] kcal/d;Understanding recommendations for meals to include 15-35% energy as protein, 25-35% energy from fat, 35-60% energy from carbohydrates, less than 259m of dietary cholesterol, 20-35 gm of total fiber daily;Understanding of distribution of calorie intake throughout the day with the consumption of 4-5  meals/snacks    Hypertension  Yes    Intervention  Provide education on lifestyle modifcations including regular physical activity/exercise, weight management, moderate sodium restriction and increased consumption of fresh fruit, vegetables, and low fat dairy, alcohol moderation, and smoking cessation.;Monitor prescription use compliance.    Expected Outcomes  Short Term: Continued assessment and intervention until BP is < 140/945mHG in hypertensive participants. < 130/8038mG in hypertensive participants with diabetes, heart failure or chronic kidney disease.;Long Term: Maintenance of blood pressure at goal levels.    Lipids  Yes    Intervention  Provide education and support for participant on nutrition & aerobic/resistive exercise along with prescribed medications to achieve LDL <4m24mDL >40mg83m Expected Outcomes  Short Term: Participant states understanding of desired cholesterol values and is compliant with medications prescribed. Participant is following exercise prescription and nutrition guidelines.;Long Term: Cholesterol controlled with medications as prescribed, with individualized exercise RX and with personalized nutrition plan. Value goals: LDL < 4mg,41m > 40 mg.        Personal Goals Discharge: Goals and Risk Factor Review - 05/05/18 1006      Core Components/Risk Factors/Patient Goals Review   Personal Goals Review  Weight Management/Obesity;Hypertension;Lipids    Review  CD continues to work on weight loss, currently he is holding steady and we would like to see him lose some more and continue to build strength and stamina.  He is still well with his blood pressures.  He check its here and has been checking it daily again at home throughout the day.  He had an episode on Tuesday of high then low blood pressure that sent him to the ED for dehydration.  He has stopped his lasix after this.    Expected Outcomes  Short: Continue to work on weight loss.  Long: Continue to monitor  risk factors.        Exercise Goals and Review: Exercise Goals    Row Name 03/21/18 1421             Exercise Goals   Increase Physical Activity  Yes       Intervention  Provide advice, education, support and counseling about physical activity/exercise needs.;Develop an individualized exercise prescription for aerobic and resistive training based on initial evaluation findings, risk stratification, comorbidities and participant's personal goals.       Expected Outcomes  Short Term: Attend rehab on a regular basis to increase amount of physical activity.;Long Term: Add in home exercise to make exercise part of routine and to increase amount of physical activity.;Long Term: Exercising regularly at least 3-5 days a week.       Increase Strength and Stamina  Yes       Intervention  Provide advice, education, support and counseling about physical activity/exercise needs.;Develop an individualized exercise prescription for  aerobic and resistive training based on initial evaluation findings, risk stratification, comorbidities and participant's personal goals.       Expected Outcomes  Short Term: Increase workloads from initial exercise prescription for resistance, speed, and METs.;Long Term: Improve cardiorespiratory fitness, muscular endurance and strength as measured by increased METs and functional capacity (6MWT);Short Term: Perform resistance training exercises routinely during rehab and add in resistance training at home       Able to understand and use rate of perceived exertion (RPE) scale  Yes       Intervention  Provide education and explanation on how to use RPE scale       Expected Outcomes  Short Term: Able to use RPE daily in rehab to express subjective intensity level;Long Term:  Able to use RPE to guide intensity level when exercising independently       Able to understand and use Dyspnea scale  Yes       Intervention  Provide education and explanation on how to use Dyspnea scale        Expected Outcomes  Short Term: Able to use Dyspnea scale daily in rehab to express subjective sense of shortness of breath during exertion;Long Term: Able to use Dyspnea scale to guide intensity level when exercising independently       Knowledge and understanding of Target Heart Rate Range (THRR)  Yes       Intervention  Provide education and explanation of THRR including how the numbers were predicted and where they are located for reference       Expected Outcomes  Short Term: Able to state/look up THRR;Long Term: Able to use THRR to govern intensity when exercising independently;Short Term: Able to use daily as guideline for intensity in rehab       Able to check pulse independently  Yes       Intervention  Provide education and demonstration on how to check pulse in carotid and radial arteries.;Review the importance of being able to check your own pulse for safety during independent exercise       Expected Outcomes  Short Term: Able to explain why pulse checking is important during independent exercise;Long Term: Able to check pulse independently and accurately       Understanding of Exercise Prescription  Yes       Intervention  Provide education, explanation, and written materials on patient's individual exercise prescription       Expected Outcomes  Short Term: Able to explain program exercise prescription;Long Term: Able to explain home exercise prescription to exercise independently          Nutrition & Weight - Outcomes: Pre Biometrics - 03/21/18 1421      Pre Biometrics   Height  5' 10.4" (1.788 m)    Weight  194 lb 12.8 oz (88.4 kg)    Waist Circumference  37 inches    Hip Circumference  41 inches    Waist to Hip Ratio  0.9 %    BMI (Calculated)  27.64    Single Leg Stand  1.89 seconds      Post Biometrics - 05/19/18 1024       Post  Biometrics   Height  5' 10.4" (1.788 m)    Weight  192 lb (87.1 kg)    Waist Circumference  38.5 inches    Hip Circumference  41.5 inches     Waist to Hip Ratio  0.93 %    BMI (Calculated)  27.24    Single  Leg Stand  11.89 seconds       Nutrition: Nutrition Therapy & Goals - 03/29/18 1206      Nutrition Therapy   Diet  TLC    Protein (specify units)  120z    Fiber  30 grams    Whole Grain Foods  3 servings   he is considering trying whole grain bread   Saturated Fats  15 max. grams    Fruits and Vegetables  5 servings/day   8 ideal, does not have a big appetite overall   Sodium  1500 grams      Personal Nutrition Goals   Nutrition Goal  Increase you weekly fruit and vegetable intake    Personal Goal #2  Practice eating on a regular schedule. This may help stimulate appetite, in addition to encouraging adequate calorie intake    Personal Goal #3  Try not to drink with meals, aside from the liquid you need in order to encourage safe swallowing, so you do not fill up on fluids instead of food    Comments  He c/o altered taste of food and decreased appetite since being put on several new medications. This has resulted in wt loss of approx 7-10# in a couple of months      Intervention Plan   Intervention  Prescribe, educate and counsel regarding individualized specific dietary modifications aiming towards targeted core components such as weight, hypertension, lipid management, diabetes, heart failure and other comorbidities.    Expected Outcomes  Short Term Goal: A plan has been developed with personal nutrition goals set during dietitian appointment.;Short Term Goal: Understand basic principles of dietary content, such as calories, fat, sodium, cholesterol and nutrients.;Long Term Goal: Adherence to prescribed nutrition plan.       Nutrition Discharge: Nutrition Assessments - 05/24/18 0946      MEDFICTS Scores   Pre Score  43    Post Score  64    Score Difference  21       Education Questionnaire Score: Knowledge Questionnaire Score - 05/24/18 1251      Knowledge Questionnaire Score   Pre Score  23/26    Post  Score  25/26       Goals reviewed with patient; copy given to patient.

## 2018-06-02 DIAGNOSIS — I214 Non-ST elevation (NSTEMI) myocardial infarction: Secondary | ICD-10-CM

## 2018-06-02 DIAGNOSIS — Z955 Presence of coronary angioplasty implant and graft: Secondary | ICD-10-CM

## 2018-06-02 DIAGNOSIS — Z48812 Encounter for surgical aftercare following surgery on the circulatory system: Secondary | ICD-10-CM | POA: Diagnosis not present

## 2018-06-02 NOTE — Progress Notes (Signed)
Discharge Progress Report  Patient Details  Name: Clayton Anderson MRN: [id number] Date of Birth: 05-Feb-1946 Referring Provider:     Cardiac Rehab from 03/21/2018 in [facility]  Referring Provider  Landry Corporal MD       Number of Visits: 36  Reason for Discharge:  Patient reached a stable level of exercise. Patient independent in their exercise. Patient has met program and personal goals.  Smoking History:  Social History   Tobacco Use  Smoking Status Former Smoker  . Packs/day: 0.90  . Years: 40.00  . Pack years: 36.00  . Types: Cigarettes  . Last attempt to quit: 07/19/2004  . Years since quitting: 13.8  Smokeless Tobacco Never Used    Diagnosis:  NSTEMI (non-ST elevated myocardial infarction) ([place])  Status post coronary artery stent placement  ADL [facility]SD:   Initial Exercise Prescription: Initial Exercise Prescription - 03/21/18 1400      Date of Initial Exercise RX and Referring Provider   Date  03/21/18    Referring Provider  Landry Corporal MD      Treadmill   MPH  2.3    Grade  0.5    Minutes  15    METs  2.92      REL-XR   Level  2    Speed  50    Minutes  15    METs  2.9      T5 Nustep   Level  2    SPM  80    Minutes  15    METs  2.9      Prescription Details   Frequency (times per week)  2    Duration  Progress to 30 minutes of continuous aerobic without signs/symptoms of physical distress      Intensity   THRR 40-80% of Max Heartrate  110-137    Ratings of Perceived Exertion  11-13    Perceived Dyspnea  0-4      Progression   Progression  Continue to progress workloads to maintain intensity without signs/symptoms of physical distress.      Resistance Training   Training Prescription  Yes    Weight  4 lbs    Reps  10-15       Discharge Exercise Prescription (Final Exercise Prescription Changes): Exercise Prescription Changes - 05/24/18 1100      Response to Exercise   Blood Pressure (Admit)  110/60    Blood Pressure (Exercise)  136/74    Blood Pressure (Exit)  110/64    Heart Rate (Admit)  66 bpm    Heart Rate (Exercise)  116 bpm    Heart Rate (Exit)  65 bpm    Rating of Perceived Exertion (Exercise)  13    Symptoms  none    Duration  Continue with 30 min of aerobic exercise without signs/symptoms of physical distress.    Intensity  THRR unchanged      Progression   Progression  Continue to progress workloads to maintain intensity without signs/symptoms of physical distress.    Average METs  3.87      Resistance Training   Training Prescription  Yes    Weight  4 lbs    Reps  10-15      Interval Training   Interval Training  No      Treadmill   MPH  2.3    Grade  2    Minutes  15    METs  3.4  REL-XR   Level  7    Minutes  15    METs  5.7      T5 Nustep   Level  4    Minutes  15    METs  2.5      Home Exercise Plan   Plans to continue exercise at  Home (comment)   walking   Frequency  Add 3 additional days to program exercise sessions.    Initial Home Exercises Provided  04/07/18       Functional Capacity: 6 Minute Walk    Row Name 03/21/18 1416 05/19/18 1023       6 Minute Walk   Phase  Initial  Discharge    Distance  1266 feet  1442 feet    Distance % Change  -  13.9 %    Distance Feet Change  -  176 ft    Walk Time  6 minutes  6 minutes    # of Rest Breaks  0  0    MPH  2.4  2.73    METS  3.03  3.24    RPE  13  15    Perceived Dyspnea   2  1    VO2 Peak  10.59  11.33    Symptoms  Yes (comment)  No    Comments  hip pain at end 6/10, SOB  -    Resting HR  83 bpm  78 bpm    Resting BP  124/64  102/60    Resting Oxygen Saturation   96 %  -    Exercise Oxygen Saturation  during 6 min walk  94 %  -    Max Ex. HR  96 bpm  93 bpm    Max Ex. BP  156/84  144/64    2 Minute Post BP  126/74  -       Psychological, QOL, Others - Outcomes: PHQ 2/9: Depression screen [facility] 2/9 05/24/2018 04/14/2018 03/21/2018 03/26/2016 03/16/2016  Decreased Interest 2  0 2 0 0  Down, Depressed, Hopeless '3 1 1 ' 0 0  PHQ - 2 Score '5 1 3 ' 0 0  Altered sleeping 3 1 0 - -  Tired, decreased energy '3 3 3 ' - -  Change in appetite '2 1 1 ' - -  Feeling bad or failure about yourself  3 1 0 - -  Trouble concentrating 1 0 1 - -  Moving slowly or fidgety/restless '2 1 1 ' - -  Suicidal thoughts 2 0 0 - -  PHQ-9 Score '21 8 9 ' - -  Difficult doing work/chores Very difficult Somewhat difficult Very difficult - -    Quality of Life: Quality of Life - 05/24/18 0947      Quality of Life   Select  Quality of Life      Quality of Life Scores   Health/Function Pre  14.73 %    Health/Function Post  16.11 %    Health/Function % Change  9.37 %    Socioeconomic Pre  21.92 %    Socioeconomic Post  21.21 %    Socioeconomic % Change   -3.24 %    Psych/Spiritual Pre  13.33 %    Psych/Spiritual Post  14.93 %    Psych/Spiritual % Change  12 %    Family Pre  17.3 %    Family Post  23.7 %    Family % Change  36.99 %    GLOBAL Pre  16.22 %  GLOBAL Post  18.09 %    GLOBAL % Change  11.53 %       Personal Goals: Goals established at orientation with interventions provided to work toward goal. Personal Goals and Risk Factors at Admission - 03/21/18 1347      Core Components/Risk Factors/Patient Goals on Admission    Weight Management  Yes;Weight Loss    Intervention  Weight Management: Provide education and appropriate resources to help participant work on and attain dietary goals.;Weight Management: Develop a combined nutrition and exercise program designed to reach desired caloric intake, while maintaining appropriate intake of nutrient and fiber, sodium and fats, and appropriate energy expenditure required for the weight goal.;Weight Management/Obesity: Establish reasonable short term and long term weight goals.    Admit Weight  194 lb (88 kg)    Goal Weight: Short Term  190 lb (86.2 kg)    Goal Weight: Long Term  180 lb (81.6 kg)    Expected Outcomes  Short Term: Continue  to assess and modify interventions until short term weight is achieved;Long Term: Adherence to nutrition and physical activity/exercise program aimed toward attainment of established weight goal;Weight Loss: Understanding of general recommendations for a balanced deficit meal plan, which promotes 1-2 lb weight loss per week and includes a negative energy balance of [phone] kcal/d;Understanding recommendations for meals to include 15-35% energy as protein, 25-35% energy from fat, 35-60% energy from carbohydrates, less than 248m of dietary cholesterol, 20-35 gm of total fiber daily;Understanding of distribution of calorie intake throughout the day with the consumption of 4-5 meals/snacks    Hypertension  Yes    Intervention  Provide education on lifestyle modifcations including regular physical activity/exercise, weight management, moderate sodium restriction and increased consumption of fresh fruit, vegetables, and low fat dairy, alcohol moderation, and smoking cessation.;Monitor prescription use compliance.    Expected Outcomes  Short Term: Continued assessment and intervention until BP is < 140/919mHG in hypertensive participants. < 130/8081mG in hypertensive participants with diabetes, heart failure or chronic kidney disease.;Long Term: Maintenance of blood pressure at goal levels.    Lipids  Yes    Intervention  Provide education and support for participant on nutrition & aerobic/resistive exercise along with prescribed medications to achieve LDL <25m31mDL >40mg85m Expected Outcomes  Short Term: Participant states understanding of desired cholesterol values and is compliant with medications prescribed. Participant is following exercise prescription and nutrition guidelines.;Long Term: Cholesterol controlled with medications as prescribed, with individualized exercise RX and with personalized nutrition plan. Value goals: LDL < 25mg,19m > 40 mg.        Personal Goals Discharge: Goals and Risk  Factor Review    Row Name 04/07/18 1011 05/05/18 1006           Core Components/Risk Factors/Patient Goals Review   Personal Goals Review  Weight Management/Obesity;Hypertension;Lipids  Weight Management/Obesity;Hypertension;Lipids      Review  CD is working on weight loss.  He is down to 190lbs and would like to continue to work 180lbs.  He continues to want to increase strength and stamina.  His blood pressures have been doing well.  He checks them on occasional and we talked about increasing his frequency to 3-4x week.  He is on 17 medications and really would like to come off of some of them.  He is doing well on his medicaitons.   CD continues to work on weight loss, currently he is holding steady and we would like to see him lose  some more and continue to build strength and stamina.  He is still well with his blood pressures.  He check its here and has been checking it daily again at home throughout the day.  He had an episode on Tuesday of high then low blood pressure that sent him to the ED for dehydration.  He has stopped his lasix after this.      Expected Outcomes  Short: Start taking blood pressure more often Long: Continue to work on weight loss.   Short: Continue to work on weight loss.  Long: Continue to monitor risk factors.          Exercise Goals and Review: Exercise Goals    Row Name 03/21/18 1421             Exercise Goals   Increase Physical Activity  Yes       Intervention  Provide advice, education, support and counseling about physical activity/exercise needs.;Develop an individualized exercise prescription for aerobic and resistive training based on initial evaluation findings, risk stratification, comorbidities and participant's personal goals.       Expected Outcomes  Short Term: Attend rehab on a regular basis to increase amount of physical activity.;Long Term: Add in home exercise to make exercise part of routine and to increase amount of physical activity.;Long  Term: Exercising regularly at least 3-5 days a week.       Increase Strength and Stamina  Yes       Intervention  Provide advice, education, support and counseling about physical activity/exercise needs.;Develop an individualized exercise prescription for aerobic and resistive training based on initial evaluation findings, risk stratification, comorbidities and participant's personal goals.       Expected Outcomes  Short Term: Increase workloads from initial exercise prescription for resistance, speed, and METs.;Long Term: Improve cardiorespiratory fitness, muscular endurance and strength as measured by increased METs and functional capacity (6MWT);Short Term: Perform resistance training exercises routinely during rehab and add in resistance training at home       Able to understand and use rate of perceived exertion (RPE) scale  Yes       Intervention  Provide education and explanation on how to use RPE scale       Expected Outcomes  Short Term: Able to use RPE daily in rehab to express subjective intensity level;Long Term:  Able to use RPE to guide intensity level when exercising independently       Able to understand and use Dyspnea scale  Yes       Intervention  Provide education and explanation on how to use Dyspnea scale       Expected Outcomes  Short Term: Able to use Dyspnea scale daily in rehab to express subjective sense of shortness of breath during exertion;Long Term: Able to use Dyspnea scale to guide intensity level when exercising independently       Knowledge and understanding of Target Heart Rate Range (THRR)  Yes       Intervention  Provide education and explanation of THRR including how the numbers were predicted and where they are located for reference       Expected Outcomes  Short Term: Able to state/look up THRR;Long Term: Able to use THRR to govern intensity when exercising independently;Short Term: Able to use daily as guideline for intensity in rehab       Able to check pulse  independently  Yes       Intervention  Provide education and demonstration on how to check  pulse in carotid and radial arteries.;Review the importance of being able to check your own pulse for safety during independent exercise       Expected Outcomes  Short Term: Able to explain why pulse checking is important during independent exercise;Long Term: Able to check pulse independently and accurately       Understanding of Exercise Prescription  Yes       Intervention  Provide education, explanation, and written materials on patient's individual exercise prescription       Expected Outcomes  Short Term: Able to explain program exercise prescription;Long Term: Able to explain home exercise prescription to exercise independently          Nutrition & Weight - Outcomes: Pre Biometrics - 03/21/18 1421      Pre Biometrics   Height  5' 10.4" (1.788 m)    Weight  194 lb 12.8 oz (88.4 kg)    Waist Circumference  37 inches    Hip Circumference  41 inches    Waist to Hip Ratio  0.9 %    BMI (Calculated)  27.64    Single Leg Stand  1.89 seconds      Post Biometrics - 05/19/18 1024       Post  Biometrics   Height  5' 10.4" (1.788 m)    Weight  192 lb (87.1 kg)    Waist Circumference  38.5 inches    Hip Circumference  41.5 inches    Waist to Hip Ratio  0.93 %    BMI (Calculated)  27.24    Single Leg Stand  11.89 seconds       Nutrition: Nutrition Therapy & Goals - 03/29/18 1206      Nutrition Therapy   Diet  TLC    Protein (specify units)  120z    Fiber  30 grams    Whole Grain Foods  3 servings   he is considering trying whole grain bread   Saturated Fats  15 max. grams    Fruits and Vegetables  5 servings/day   8 ideal, does not have a big appetite overall   Sodium  1500 grams      Personal Nutrition Goals   Nutrition Goal  Increase you weekly fruit and vegetable intake    Personal Goal #2  Practice eating on a regular schedule. This may help stimulate appetite, in addition to  encouraging adequate calorie intake    Personal Goal #3  Try not to drink with meals, aside from the liquid you need in order to encourage safe swallowing, so you do not fill up on fluids instead of food    Comments  He c/o altered taste of food and decreased appetite since being put on several new medications. This has resulted in wt loss of approx 7-10# in a couple of months      Intervention Plan   Intervention  Prescribe, educate and counsel regarding individualized specific dietary modifications aiming towards targeted core components such as weight, hypertension, lipid management, diabetes, heart failure and other comorbidities.    Expected Outcomes  Short Term Goal: A plan has been developed with personal nutrition goals set during dietitian appointment.;Short Term Goal: Understand basic principles of dietary content, such as calories, fat, sodium, cholesterol and nutrients.;Long Term Goal: Adherence to prescribed nutrition plan.       Nutrition Discharge: Nutrition Assessments - 05/24/18 0946      MEDFICTS Scores   Pre Score  43    Post Score  64  Score Difference  21       Education Questionnaire Score: Knowledge Questionnaire Score - 05/24/18 1251      Knowledge Questionnaire Score   Pre Score  23/26    Post Score  25/26       Goals reviewed with patient; copy given to patient.

## 2018-06-02 NOTE — Progress Notes (Signed)
Daily Session Note  Patient Details  Name: Clayton Anderson MRN: [id number] Date of Birth: 09/27/1945 Referring Provider:     Cardiac Rehab from 03/21/2018 in [facility] Rehab  Referring Provider  Landry Corporal MD      Encounter Date: 06/02/2018  Check In: Session Check In - 06/02/18 0910      Check-In   Supervising physician immediately available to respond to emergencies  See telemetry face sheet for immediately available ER MD    Location  [facility]-Cardiac & Pulmonary Rehab    Staff Present  Carson Myrtle, BS, RRT, Respiratory Lennie Hummer, MA, RCEP, CCRP, Exercise Physiologist;Carroll Enterkin, RN, BSN    Medication changes reported      No    Fall or balance concerns reported     No    Tobacco Cessation  No Change    Warm-up and Cool-down  Performed on first and last piece of equipment    Resistance Training Performed  Yes    VAD Patient?  No      Pain Assessment   Currently in Pain?  No/denies          Social History   Tobacco Use  Smoking Status Former Smoker  . Packs/day: 0.90  . Years: 40.00  . Pack years: 36.00  . Types: Cigarettes  . Last attempt to quit: 07/19/2004  . Years since quitting: 13.8  Smokeless Tobacco Never Used    Goals Met:  Independence with exercise equipment Exercise tolerated well No report of cardiac concerns or symptoms Strength training completed today  Goals Unmet:  Not Applicable  Comments: Pt able to follow exercise prescription today without complaint.  Will continue to monitor for progression.   [name] graduated today from  rehab with 36 sessions completed.  Details of the patient's exercise prescription and what He needs to do in order to continue the prescription and progress were discussed with patient.  Patient was given a copy of prescription and goals.  Patient verbalized understanding.  [name] plans to continue to exercise by walk at home and join the [facility]. Dr. Emily Filbert is Medical Director  for [facility].

## 2018-06-02 NOTE — Progress Notes (Signed)
Cardiac Individual Treatment Plan  Patient Details  Name: Clayton Anderson MRN: [id number] Date of Birth: February 18, 1946 Referring Provider:     Cardiac Rehab from 03/21/2018 in [facility]iac [facility]  Referring Provider  Landry Corporal MD      Initial Encounter Date:    Cardiac Rehab from 03/21/2018 in [facility] Cardiac and Pulmonary Rehab  Date  03/21/18      Visit Diagnosis: NSTEMI (non-ST elevated myocardial infarction) [facility])  Status post coronary artery stent placement  Patient's Home Medications on Admission:  Current Outpatient Medications:  .  amLODipine (NORVASC) 10 MG tablet, Take 1 tablet (10 mg total) by mouth daily., Disp: 30 tablet, Rfl: 12 .  aspirin EC 81 MG tablet, Take 81 mg by mouth daily.  , Disp: , Rfl:  .  buPROPion (WELLBUTRIN XL) 150 MG 24 hr tablet, Take 150 mg by mouth every morning., Disp: , Rfl: 1 .  carvedilol (COREG) 3.125 MG tablet, Take 1 tablet (3.125 mg total) by mouth 2 (two) times daily with a meal., Disp: 60 tablet, Rfl: 12 .  clopidogrel (PLAVIX) 75 MG tablet, , Disp: , Rfl:  .  DULoxetine (CYMBALTA) 60 MG capsule, , Disp: , Rfl:  .  escitalopram (LEXAPRO) 10 MG tablet, Take 10 mg by mouth daily. Reported on 02/03/2016, Disp: , Rfl:  .  folic acid (FOLVITE) 195 MCG tablet, Take 800 mcg by mouth daily. Reported on 02/03/2016, Disp: , Rfl:  .  gabapentin (NEURONTIN) 300 MG capsule, Take 300 mg by mouth at bedtime. , Disp: , Rfl:  .  nitroGLYCERIN (NITROSTAT) 0.4 MG SL tablet, Place 0.4 mg under the tongue every 5 (five) minutes as needed.  , Disp: , Rfl:  .  Oxycodone HCl 20 MG TABS, Take 40-60 mg by mouth every 4 (four) hours as needed (pain)., Disp: , Rfl:  .  ramipril (ALTACE) 10 MG capsule, Take 10 mg by mouth daily.  , Disp: , Rfl:  .  rosuvastatin (CRESTOR) 40 MG tablet, Take 1 tablet (40 mg total) by mouth daily at 6 PM., Disp: 30 tablet, Rfl: 12 .  spironolactone (ALDACTONE) 25 MG tablet, Take 0.5 tablets (12.5 mg total) by mouth daily., Disp:  30 tablet, Rfl: `12 .  tiZANidine (ZANAFLEX) 2 MG tablet, Take 2-4 mg by mouth 4 (four) times daily as needed for muscle spasms., Disp: , Rfl: 0  Past Medical History: Past Medical History:  Diagnosis Date  . Anxiety   . Arthritis   . Baker's cyst of knee    left  . Cervical disc disease 07/20/2011  . Complication of anesthesia    " I wake up in a combative mode"  . Coronary artery disease   . Depression   . Elevated cholesterol   . GERD (gastroesophageal reflux disease)   . Headache(784.0)    years ago  . History of hiatal hernia   . Hypertension   . Myocardial infarct [facility])    2001  sees Dr. Thurman Coyer  . Neuromuscular disorder (HCC)    hx of shingles/ myofacitis  . Shortness of breath     Tobacco Use: Social History   Tobacco Use  Smoking Status Former Smoker  . Packs/day: 0.90  . Years: 40.00  . Pack years: 36.00  . Types: Cigarettes  . Last attempt to quit: 07/19/2004  . Years since quitting: 13.8  Smokeless Tobacco Never Used    Labs: Recent Review [facility] Cardiac [facility]  Latest Ref Rng & Units 07/20/2011 07/02/2017 07/03/2017   Cholestrol 0 - 200 mg/dL - - 219(H)   LDLCALC 0 - 99 mg/dL - - 131(H)   HDL >40 mg/dL - - 34(L)   Trlycerides <150 mg/dL - - 272(H)   Hemoglobin A1c 4.8 - 5.6 % - 5.9(H) -   TCO2 0 - 100 mmol/L 28 - -       Exercise Target Goals: Exercise Program Goal: Individual exercise prescription set using results from initial 6 min walk test and THRR while considering  patient's activity barriers and safety.   Exercise Prescription Goal: Initial exercise prescription builds to 30-45 minutes a day of aerobic activity, 2-3 days per week.  Home exercise guidelines will be given to patient during program as part of exercise prescription that the participant will acknowledge.  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 hip pain with walking   Cardiac Risk Stratification  High       6 Minute Walk: 6 Minute Walk    Row Name 03/21/18 1416 05/19/18 1023       6 Minute Walk   Phase  Initial  Discharge    Distance  1266 feet  1442 feet    Distance % Change  -  13.9 %    Distance Feet Change  -  176 ft    Walk Time  6 minutes  6 minutes    # of Rest Breaks  0  0    MPH  2.4  2.73    METS  3.03  3.24    RPE  13  15    Perceived Dyspnea   2  1    VO2 Peak  10.59  11.33    Symptoms  Yes (comment)  No    Comments  hip pain at end 6/10, SOB  -    Resting HR  83 bpm  78 bpm    Resting BP  124/64  102/60    Resting Oxygen Saturation   96 %  -    Exercise Oxygen Saturation  during 6 min walk  94 %  -    Max Ex. HR  96 bpm  93 bpm    Max Ex. BP  156/84  144/64    2 Minute Post BP  126/74  -       Oxygen Initial Assessment:   Oxygen Re-Evaluation:   Oxygen Discharge (Final Oxygen Re-Evaluation):   Initial Exercise Prescription: Initial Exercise Prescription - 03/21/18 1400      Date of Initial Exercise RX and Referring Provider   Date  03/21/18    Referring Provider  Landry Corporal MD      Treadmill   MPH  2.3    Grade  0.5    Minutes  15    METs  2.92      REL-XR   Level  2    Speed  50    Minutes  15    METs  2.9      T5 Nustep   Level  2    SPM  80    Minutes  15    METs  2.9      Prescription Details   Frequency (times per week)  2    Duration  Progress to 30 minutes of continuous aerobic without signs/symptoms of physical distress      Intensity   THRR 40-80% of Max Heartrate  110-137    Ratings of Perceived Exertion  11-13    Perceived Dyspnea  0-4      Progression   Progression  Continue to progress workloads to  maintain intensity without signs/symptoms of physical distress.      Resistance Training   Training Prescription  Yes    Weight  4 lbs    Reps  10-15       Perform Capillary Blood Glucose checks as needed.  Exercise Prescription Changes: Exercise Prescription Changes    Row Name 03/21/18 1400 03/31/18 1100 04/07/18 1000 04/12/18 1700 04/27/18 1100     Response to Exercise   Blood Pressure (Admit)  124/64  118/62  -  98/52  106/56   Blood Pressure (Exercise)  156/84  140/86  -  162/60  146/60   Blood Pressure (Exit)  126/74  126/84  -  124/62  134/58   Heart Rate (Admit)  83 bpm  77 bpm  -  69 bpm  69 bpm   Heart Rate (Exercise)  96 bpm  94 bpm  -  108 bpm  99 bpm   Heart Rate (Exit)  59 bpm  75 bpm  -  68 bpm  68 bpm   Oxygen Saturation (Admit)  96 %  -  -  -  -   Oxygen Saturation (Exercise)  94 %  -  -  -  -   Rating of Perceived Exertion (Exercise)  13  13  -  12  13   Perceived Dyspnea (Exercise)  2  -  -  -  -   Symptoms  hip pain 6/10, SOB  none  -  none  none   Comments  walk test results  1st full week of exercise   -  -  -   Duration  -  Continue with 30 min of aerobic exercise without signs/symptoms of physical distress.  -  Continue with 30 min of aerobic exercise without signs/symptoms of physical distress.  Continue with 30 min of aerobic exercise without signs/symptoms of physical distress.   Intensity  -  THRR unchanged  -  THRR unchanged  THRR unchanged     Progression   Progression  -  Continue to progress workloads to maintain intensity without signs/symptoms of physical distress.  -  Continue to progress workloads to maintain intensity without signs/symptoms of physical distress.  Continue to progress workloads to maintain intensity without signs/symptoms of physical distress.   Average METs  -  2.2  -  2.94  3.17     Resistance Training   Training Prescription  -  Yes  -  Yes  Yes   Weight  -  4 lbs  -  4 lbs  4 lbs   Reps  -  10-15  -  10-15  10-15      Interval Training   Interval Training  -  -  -  No  No     Treadmill   MPH  -  2.3  -  2.3  2.3   Grade  -  0.5  -  0.5  0.5   Minutes  -  15  -  15  15   METs  -  2.92  -  2.92  2.92  REL-XR   Level  -  2  -  2  4   Minutes  -  15  -  15  15   METs  -  2.6  -  3.8  4     T5 Nustep   Level  -  2  -  2  2   Minutes  -  15  -  15  15   METs  -  2.9  -  2.8  2.4     Home Exercise Plan   Plans to continue exercise at  -  -  Home (comment) walking  Home (comment) walking  Home (comment) walking   Frequency  -  -  Add 3 additional days to program exercise sessions.  Add 3 additional days to program exercise sessions.  Add 3 additional days to program exercise sessions.   Initial Home Exercises Provided  -  -  04/07/18  04/07/18  04/07/18   Row Name 05/10/18 1500 05/24/18 1100           Response to Exercise   Blood Pressure (Admit)  132/70  110/60      Blood Pressure (Exercise)  148/58  136/74      Blood Pressure (Exit)  114/56  110/64      Heart Rate (Admit)  50 bpm  66 bpm      Heart Rate (Exercise)  95 bpm  116 bpm      Heart Rate (Exit)  75 bpm  65 bpm      Rating of Perceived Exertion (Exercise)  13  13      Symptoms  none  none      Duration  Continue with 30 min of aerobic exercise without signs/symptoms of physical distress.  Continue with 30 min of aerobic exercise without signs/symptoms of physical distress.      Intensity  THRR unchanged  THRR unchanged        Progression   Progression  Continue to progress workloads to maintain intensity without signs/symptoms of physical distress.  Continue to progress workloads to maintain intensity without signs/symptoms of physical distress.      Average METs  3.7  3.87        Resistance Training   Training Prescription  Yes  Yes      Weight  4 lbs  4 lbs      Reps  10-15  10-15        Interval Training   Interval Training  No  No        Treadmill   MPH  2.3  2.3      Grade  2  2      Minutes  15  15      METs  3.4   3.4        REL-XR   Level  6  7      Minutes  15  15      METs  5.2  5.7        T5 Nustep   Level  4  4      Minutes  15  15      METs  2.5  2.5        Home Exercise Plan   Plans to continue exercise at  Home (comment) walking  Home (comment) walking      Frequency  Add 3 additional days to program exercise sessions.  Add 3 additional days to program exercise sessions.  Initial Home Exercises Provided  04/07/18  04/07/18         Exercise Comments: Exercise Comments    Row Name 03/29/18 1062 06/02/18 1005         Exercise Comments  First full day of exercise!  Patient was oriented to gym and equipment including functions, settings, policies, and procedures.  Patient's individual exercise prescription and treatment plan were reviewed.  All starting workloads were established based on the results of the 6 minute walk test done at initial orientation visit.  The plan for exercise progression was also introduced and progression will be customized based on patient's performance and goals.   Brenda graduated today from  rehab with 36 sessions completed.  Details of the patient's exercise prescription and what He needs to do in order to continue the prescription and progress were discussed with patient.  Patient was given a copy of prescription and goals.  Patient verbalized understanding.  Alfredo plans to continue to exercise by walk at home and join the [facility].         Exercise Goals and Review: Exercise Goals    Row Name 03/21/18 1421             Exercise Goals   Increase Physical Activity  Yes       Intervention  Provide advice, education, support and counseling about physical activity/exercise needs.;Develop an individualized exercise prescription for aerobic and resistive training based on initial evaluation findings, risk stratification, comorbidities and participant's personal goals.       Expected Outcomes  Short Term: Attend rehab on a regular basis to increase amount of  physical activity.;Long Term: Add in home exercise to make exercise part of routine and to increase amount of physical activity.;Long Term: Exercising regularly at least 3-5 days a week.       Increase Strength and Stamina  Yes       Intervention  Provide advice, education, support and counseling about physical activity/exercise needs.;Develop an individualized exercise prescription for aerobic and resistive training based on initial evaluation findings, risk stratification, comorbidities and participant's personal goals.       Expected Outcomes  Short Term: Increase workloads from initial exercise prescription for resistance, speed, and METs.;Long Term: Improve cardiorespiratory fitness, muscular endurance and strength as measured by increased METs and functional capacity (6MWT);Short Term: Perform resistance training exercises routinely during rehab and add in resistance training at home       Able to understand and use rate of perceived exertion (RPE) scale  Yes       Intervention  Provide education and explanation on how to use RPE scale       Expected Outcomes  Short Term: Able to use RPE daily in rehab to express subjective intensity level;Long Term:  Able to use RPE to guide intensity level when exercising independently       Able to understand and use Dyspnea scale  Yes       Intervention  Provide education and explanation on how to use Dyspnea scale       Expected Outcomes  Short Term: Able to use Dyspnea scale daily in rehab to express subjective sense of shortness of breath during exertion;Long Term: Able to use Dyspnea scale to guide intensity level when exercising independently       Knowledge and understanding of Target Heart Rate Range (THRR)  Yes       Intervention  Provide education and explanation of THRR including how the numbers were predicted and  where they are located for reference       Expected Outcomes  Short Term: Able to state/look up THRR;Long Term: Able to use THRR to govern  intensity when exercising independently;Short Term: Able to use daily as guideline for intensity in rehab       Able to check pulse independently  Yes       Intervention  Provide education and demonstration on how to check pulse in carotid and radial arteries.;Review the importance of being able to check your own pulse for safety during independent exercise       Expected Outcomes  Short Term: Able to explain why pulse checking is important during independent exercise;Long Term: Able to check pulse independently and accurately       Understanding of Exercise Prescription  Yes       Intervention  Provide education, explanation, and written materials on patient's individual exercise prescription       Expected Outcomes  Short Term: Able to explain program exercise prescription;Long Term: Able to explain home exercise prescription to exercise independently          Exercise Goals Re-Evaluation : Exercise Goals Re-Evaluation    [place] Name 03/29/18 1045 03/31/18 1157 04/07/18 0957 04/12/18 1700 04/27/18 1136     Exercise Goal Re-Evaluation   Exercise Goals Review  Increase Physical Activity;Able to understand and use rate of perceived exertion (RPE) scale;Knowledge and understanding of Target Heart Rate Range (THRR);Understanding of Exercise Prescription;Increase Strength and Stamina;Able to check pulse independently  Increase Physical Activity;Able to understand and use rate of perceived exertion (RPE) scale;Knowledge and understanding of Target Heart Rate Range (THRR);Understanding of Exercise Prescription;Increase Strength and Stamina;Able to check pulse independently  Increase Physical Activity;Increase Strength and Stamina;Understanding of Exercise Prescription  Increase Physical Activity;Increase Strength and Stamina;Understanding of Exercise Prescription  Increase Physical Activity;Increase Strength and Stamina;Understanding of Exercise Prescription   Comments  Reviewed RPE scale, THR and program  prescription with pt today.  Pt voiced understanding and was given a copy of goals to take home.   First full week of exercise for CD. He is doing well in rehab so far and getting the hang of using all the equipment. We will continue to monitor and progress him as tolerated.   CD has been doing well in rehab.  His biggest limitation is the pain that he gets in his hips.  He says that if he was at home he would quit but pushes through it here.  Reviewed home exercise with pt today.  Pt plans to walk at home for exercise. 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 Reviewed THR, pulse, RPE, sign and symptoms, NTG use, and when to call 911 or MD.  Also discussed weather considerations and indoor options.  Pt voiced understanding.  CD continues to do well in rehab.  He is up to 3.8 METs on the XR.  We will continue to monitor his progress.   CD has been doing well in rehab.  He continues to make improvements.  He is up to 4.0 METs on the XR now.  We will continue to monitor his progression.    Expected Outcomes  Short: Use RPE daily to regulate intensity. Long: Follow program prescription in THR.  Short: become comfortable using machines on his own. Long: increase overall MET level   Short: Start add in more walking at home.  Long: Continue to increase strength and stamina.  Short: Increase workloads on seated equipment.  Long: Continue to add in home exercise.   Short: Increase NuStep.  Long: Continue to exercies more at home.    [place] Name 05/05/18 1004 05/10/18 1543 05/24/18 1154         Exercise Goal Re-Evaluation   Exercise Goals Review  Increase Physical Activity;Increase Strength and Stamina;Understanding of Exercise Prescription  Increase Physical Activity;Increase Strength and Stamina;Understanding of Exercise Prescription  Increase Physical Activity;Increase Strength and Stamina;Understanding of Exercise Prescription     Comments  CD is doing  well in rehab.  He is starting to feel his strength and stamina are getting better.  He is able to increase workloads in class but is still tired and not doing his home exercise.  We talked about making sure he is getting at least 3 days a week for exercise. He is going to try to get his one extra day a week.   CD continues to do well in rehab.  He is up to level 6 now on the XR and level 4 on the T5 NuStep.  We will continue to monitor his progression.   CD will be graduating next week!!  He has improved his post 6MWT by 176 ft!!  He is planning to continue to exercise after graduation to help with his stress levels.  He is looking at using his [employer] research officer, government either at the [facility] or [employer].  He is trying to find the best option for him.     Expected Outcomes  Short: Add in at least one extra day a week at home.  Long: Continue to exercise at home.   Short: Continue to get in more exercise at home.  Long: Continue to increase strength and stamina.   Short: Graduate!!! Long: Continue to exercise independently.         Discharge Exercise Prescription (Final Exercise Prescription Changes): Exercise Prescription Changes - 05/24/18 1100      Response to Exercise   Blood Pressure (Admit)  110/60    Blood Pressure (Exercise)  136/74    Blood Pressure (Exit)  110/64    Heart Rate (Admit)  66 bpm    Heart Rate (Exercise)  116 bpm    Heart Rate (Exit)  65 bpm    Rating of Perceived Exertion (Exercise)  13    Symptoms  none    Duration  Continue with 30 min of aerobic exercise without signs/symptoms of physical distress.    Intensity  THRR unchanged      Progression   Progression  Continue to progress workloads to maintain intensity without signs/symptoms of physical distress.    Average METs  3.87      Resistance Training   Training Prescription  Yes    Weight  4 lbs    Reps  10-15      Interval Training   Interval Training  No      Treadmill   MPH  2.3    Grade  2    Minutes  15     METs  3.4      REL-XR   Level  7    Minutes  15    METs  5.7      T5 Nustep   Level  4    Minutes  15    METs  2.5      Home Exercise Plan   Plans to continue exercise at  Home (comment)   walking   Frequency  Add 3  additional days to program exercise sessions.    Initial Home Exercises Provided  04/07/18       Nutrition:  Target Goals: Understanding of nutrition guidelines, daily intake of sodium '1500mg'$ , cholesterol '200mg'$ , calories 30% from fat and 7% or less from saturated fats, daily to have 5 or more servings of fruits and vegetables.  Biometrics: Pre Biometrics - 03/21/18 1421      Pre Biometrics   Height  5' 10.4" (1.788 m)    Weight  194 lb 12.8 oz (88.4 kg)    Waist Circumference  37 inches    Hip Circumference  41 inches    Waist to Hip Ratio  0.9 %    BMI (Calculated)  27.64    Single Leg Stand  1.89 seconds      Post Biometrics - 05/19/18 1024       Post  Biometrics   Height  5' 10.4" (1.788 m)    Weight  192 lb (87.1 kg)    Waist Circumference  38.5 inches    Hip Circumference  41.5 inches    Waist to Hip Ratio  0.93 %    BMI (Calculated)  27.24    Single Leg Stand  11.89 seconds       Nutrition Therapy Plan and Nutrition Goals: Nutrition Therapy & Goals - 03/29/18 1206      Nutrition Therapy   Diet  TLC    Protein (specify units)  120z    Fiber  30 grams    Whole Grain Foods  3 servings   he is considering trying whole grain bread   Saturated Fats  15 max. grams    Fruits and Vegetables  5 servings/day   8 ideal, does not have a big appetite overall   Sodium  1500 grams      Personal Nutrition Goals   Nutrition Goal  Increase you weekly fruit and vegetable intake    Personal Goal #2  Practice eating on a regular schedule. This may help stimulate appetite, in addition to encouraging adequate calorie intake    Personal Goal #3  Try not to drink with meals, aside from the liquid you need in order to encourage safe swallowing, so you do not  fill up on fluids instead of food    Comments  He c/o altered taste of food and decreased appetite since being put on several new medications. This has resulted in wt loss of approx 7-10# in a couple of months      Intervention Plan   Intervention  Prescribe, educate and counsel regarding individualized specific dietary modifications aiming towards targeted core components such as weight, hypertension, lipid management, diabetes, heart failure and other comorbidities.    Expected Outcomes  Short Term Goal: A plan has been developed with personal nutrition goals set during dietitian appointment.;Short Term Goal: Understand basic principles of dietary content, such as calories, fat, sodium, cholesterol and nutrients.;Long Term Goal: Adherence to prescribed nutrition plan.       Nutrition Assessments: Nutrition Assessments - 05/24/18 0946      MEDFICTS Scores   Pre Score  43    Post Score  64    Score Difference  21       Nutrition Goals Re-Evaluation: Nutrition Goals Re-Evaluation    Row Name 03/29/18 1219 04/07/18 0958 04/28/18 1039 05/19/18 1023       Goals   Current Weight  -  190 lb (86.2 kg)  -  -    Nutrition Goal  Practice eating on a regular schedule. This may help stimulate appetite, in addition to encouraging adequate calorie intake  Eat on a regular schedule, not skip meals, more fruits and vegetables, limit fluids during meals.   Try to eat on a regular/ consistent schedule as much as possible and do not skip meals. Increase fruit and vegetable intake and limit fluid intake with meals aside from what is required for safe swallowing  Practice eating on a more regular schedule and try not to skip meals; increase weekly intake of fruits and vegetables; try not to drink with meals aside from what is needed for safe swallowing    Comment  He c/o decreased appetite and altered taste of food s/p hospitalization and new medication prescriptions  CD is not able to eat on a regular  schedule.  He is trying to get better about not skipping meals.  He is aiming for two meals a day.  He usually eats some time between 10-11 and then in the evening.  His daughter in law is the one doing the cooking.  He is not eating more fruits and vegetables yet but he will try to add that it in.   He has started to eat an additional snack on exercise days after he gets back home. Still eating 2 meals/day on most day with the occasional snack between meals. C/o low energy. He has started to eat fruit more regularly IE apples  He continues to eat 2 meals/day and sometimes has a snack or two. His appetite varies and he eats fruits and vegetables but is not always consistent each day    Expected Outcome  He will not skip meals, and eat snacks that contain protein and healthy fats between meals to help meet his daily calorie needs and prevent further weight loss  Short: Add in fruits and vegetables. Make sure eating at least two meals a day.  Long: Continue to work on heart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 compliant with medications prescribed. Participant is following exercise prescription and nutrition guidelines.;Long Term: Cholesterol controlled with medications as prescribed, with  individualized exercise RX and with personalized nutrition plan. Value goals: LDL < '70mg'$ , HDL > 40 mg.       Core Components/Risk Factors/Patient Goals Review:  Goals and Risk Factor Review    Row Name 04/07/18 1011 05/05/18 1006           Core Components/Risk Factors/Patient Goals Review   Personal Goals Review  Weight Management/Obesity;Hypertension;Lipids  Weight Management/Obesity;Hypertension;Lipids      Review  CD is working on weight loss.  He is down to 190lbs and would like to continue to work 180lbs.  He continues to want to increase strength and stamina.  His blood pressures have been doing well.  He checks them on occasional and we talked about increasing his frequency to 3-4x week.  He is on 17 medications and really would like to come off of some of them.  He is doing well on his medicaitons.   CD  continues to work on weight loss, currently he is holding steady and we would like to see him lose some more and continue to build strength and stamina.  He is still well with his blood pressures.  He check its here and has been checking it daily again at home throughout the day.  He had an episode on Tuesday of high then low blood pressure that sent him to the ED for dehydration.  He has stopped his lasix after this.      Expected Outcomes  Short: Start taking blood pressure more often Long: Continue to work on weight loss.   Short: Continue to work on weight loss.  Long: Continue to monitor risk factors.          Core Components/Risk Factors/Patient Goals at Discharge (Final Review):  Goals and Risk Factor Review - 05/05/18 1006      Core Components/Risk Factors/Patient Goals Review   Personal Goals Review  Weight Management/Obesity;Hypertension;Lipids    Review  CD continues to work on weight loss, currently he is holding steady and we would like to see him lose some more and continue to build strength and stamina.  He is still well with his blood pressures.  He check its here and  has been checking it daily again at home throughout the day.  He had an episode on Tuesday of high then low blood pressure that sent him to the ED for dehydration.  He has stopped his lasix after this.    Expected Outcomes  Short: Continue to work on weight loss.  Long: Continue to monitor risk factors.        ITP Comments: ITP Comments    Row Name 03/21/18 1323 03/23/18 0903 04/20/18 0614 05/18/18 0644     ITP Comments  Med Review completed. Initial ITP created. Diagnosis can be found in [facility] 07/02/2017  30 day review completed. ITP sent to Dr. Ramonita Lab, covering for Dr. Emily Filbert, Medical Director of Cardiac Rehab. Continue with ITP unless changes are made by physician.  New to program.  Has not started exercise yet  30 day review completed. ITP sent to Dr. Emily Filbert, Medical Director of Cardiac Rehab. Continue with ITP unless changes are made by physician  30 day review.  Continue with ITP unless directed changes per Medical Director review.       Comments: Discharge ITP

## 2018-06-13 ENCOUNTER — Other Ambulatory Visit: Payer: Self-pay | Admitting: Cardiology

## 2018-06-13 MED ORDER — CLOPIDOGREL BISULFATE 75 MG PO TABS: 75.0000 mg | ORAL_TABLET | Freq: Once | ORAL | 0 refills | Status: AC

## 2018-06-13 MED ORDER — CARVEDILOL 3.125 MG PO TABS: 3.1250 mg | ORAL_TABLET | Freq: Two times a day (BID) | ORAL | 0 refills | Status: DC

## 2018-06-13 MED ORDER — ROSUVASTATIN CALCIUM 40 MG PO TABS: 40.0000 mg | ORAL_TABLET | Freq: Every day | ORAL | 0 refills | Status: DC

## 2018-06-13 NOTE — Telephone Encounter (Signed)
° ° °  1. Which medications need to be refilled? (please list name of each medication and dose if known) carvedilol 3.125mg  tab 1BID; clopidogrel 75mg  1QD; and rosuvastatin 40mg  1QD  2. Which pharmacy/location (including street and city if local pharmacy) is medication to be sent to? [facility]  3. Do they need a 30 day or 90 day supply? 180

## 2018-06-13 NOTE — Telephone Encounter (Signed)
Carvedilol 3.125 mg BID, clopidogrel 75 mg daily, rosuvastatin 40 mg daily all refilled per Dr. Bing Matter.

## 2018-07-06 ENCOUNTER — Encounter (HOSPITAL_COMMUNITY): Payer: Self-pay | Admitting: Emergency Medicine

## 2018-07-06 ENCOUNTER — Ambulatory Visit (INDEPENDENT_AMBULATORY_CARE_PROVIDER_SITE_OTHER): Payer: Medicare Other

## 2018-07-06 ENCOUNTER — Ambulatory Visit (HOSPITAL_COMMUNITY)
Admission: EM | Admit: 2018-07-06 | Discharge: 2018-07-06 | Disposition: A | Discharge status: Home / Self Care | Payer: Medicare Other | Attending: Family Medicine | Admitting: Family Medicine

## 2018-07-06 DIAGNOSIS — S82832A Other fracture of upper and lower end of left fibula, initial encounter for closed fracture: Secondary | ICD-10-CM

## 2018-07-06 DIAGNOSIS — S99912A Unspecified injury of left ankle, initial encounter: Secondary | ICD-10-CM

## 2018-07-06 DIAGNOSIS — R42 Dizziness and giddiness: Secondary | ICD-10-CM

## 2018-07-06 MED ORDER — WHEELCHAIR MISC: 0 refills | Status: DC

## 2018-07-06 NOTE — ED Notes (Signed)
Ortho tech present. 

## 2018-07-06 NOTE — ED Notes (Signed)
Patient did not take crutches.

## 2018-07-06 NOTE — ED Triage Notes (Signed)
Pt sts became dizzy today and fell with pain to left lower leg; pt denies hitting head

## 2018-07-06 NOTE — Discharge Instructions (Addendum)
Please call your orthoapedist to schedule a follow up appointment within the next 5-7 days.  Please rest, ice and elevate the affected extremity. If not allergic, you may take Tylenol 1000mg  every 6 hours or as needed for discomfort. Do not remove your splint. You may use a garbage bag while showering to keep your splint dry. Use your crutches to remain non-weight bearing. Please return here if you are experiencing increased pain, tingling/numbness, swelling, redness, or fever.

## 2018-07-06 NOTE — ED Notes (Signed)
Ortho tech applied splint and attempted crutch teaching and demonstration.  Reports patient as being off balance and inappropriate for crutch use.  Family knows a family member that has a walker, but would like a wheelchair.  Notified dr hagler.

## 2018-07-06 NOTE — ED Provider Notes (Signed)
[facility]   [id number] 07/06/18 Arrival Time: 1236  ASSESSMENT & PLAN:  1. Left ankle injury, initial encounter   2. Episodic lightheadedness   3. Closed fracture of distal end of left fibula, unspecified fracture morphology, initial encounter   No recurrent lightheadedness. Monitor.  I have personally viewed the imaging studies ordered this visit. Oblique fracture of the L distal fibula.  Imaging: Dg Ankle Complete Left  Result Date: 07/06/2018 CLINICAL DATA:  Fall today. Left ankle pain. Pain posteromedial and lateral. EXAM: LEFT ANKLE COMPLETE - 3+ VIEW COMPARISON:  None. FINDINGS: Oblique fracture of the distal fibula is displaced 3 mm. There is widening of the medial tibiotalar distance without additional fracture. Extensive soft tissue swelling is present about the ankle particularly lateral, posterior, and anterior. Talar dome is intact. Calcaneal spurs are noted. IMPRESSION: 1. Oblique fracture of the distal fibula compatible with eversion injury. 2. Widening of the tibiotalar distance suggesting ligamentous disruption. 3. Extensive soft tissue swelling is also concerning for ligamentous disruption. Electronically Signed   By: Marin Robertshristopher  Mattern M.D.   On: 07/06/2018 13:57   Prefers OTC analgesics as needed. Posterior ankle and stirrup splint applied by orthopaedic tech. Crutches given. Rest the injured area as much as practical.   Discharge Instructions     Please call your orthoapedist to schedule a follow up appointment within the next 5-7 days.  Please rest, ice and elevate the affected extremity. If not allergic, you may take Tylenol 1000mg  every 6 hours or as needed for discomfort. Do not remove your splint. You may use a garbage bag while showering to keep your splint dry. Use your crutches to remain non-weight bearing. Please return here if you are experiencing increased pain, tingling/numbness, swelling, redness, or fever.     Reviewed expectations  re: course of current medical issues. Questions answered. Outlined signs and symptoms indicating need for more acute intervention. Patient verbalized understanding. After Visit Summary given.  SUBJECTIVE: History from: patient. Clayton Anderson is a 72 y.o. male who reports persistent moderate pain of his left ankle; described as aching without radiation. Injury/trama: yes, reports falling this morning and twisting ankle; immediate pain and reports inability to bear weight after injury and since; "felt a little lightheaded and turned and fell"; no recurrent feeling of lightheaded since; no vertigo; no CP/SOB; "get a little lightheaded every once in awhile"; is not concerned about lightheaded episode Symptoms have progressed to a point and plateaued since beginning. Relieved by: rest. Worsened by: movement and attempted weight bearing. Associated symptoms: none reported. Extremity sensation changes or weakness: none. Self treatment: has not tried OTCs for relief of pain. History of similar: no. No head injury reported.  Past Surgical History:  Procedure Laterality Date  . COLONOSCOPY    . CORONARY ANGIOGRAPHY N/A 07/02/2017   Procedure: CORONARY ANGIOGRAPHY;  Surgeon: Kathleene HazelMcAlhany, Christopher D, MD;  Location: [facility];  Service: Cardiovascular;  Laterality: N/A;  . CORONARY ANGIOPLASTY     x 2  last one 07/2011  . CORONARY STENT INTERVENTION N/A 07/02/2017   Procedure: CORONARY STENT INTERVENTION;  Surgeon: Tonny Bollmanooper, Michael, MD;  [facility];  Service: Cardiovascular;  Laterality: N/A;  . CORONARY STENT PLACEMENT  2001   Tetra Multi Link Stent-3T MRI compatible, normal mode  . CORONARY/GRAFT ACUTE MI REVASCULARIZATION N/A 07/02/2017   Procedure: Coronary/Graft Acute MI Revascularization;  Surgeon: Kathleene HazelMcAlhany, Christopher D, MD;  Location: [facility];  Service: Cardiovascular;  Laterality: N/A;  . LAMINECTOMY  AND MICRODISCECTOMY CERVICAL SPINE     January and  August 2010  . LEFT HEART CATH AND CORONARY ANGIOGRAPHY N/A 07/02/2017   Procedure: LEFT HEART CATH AND CORONARY ANGIOGRAPHY;  Surgeon: Tonny Bollman, MD;  [facility] INVASIVE CV LAB;  Service: Cardiovascular;  Laterality: N/A;  . LEFT HEART CATHETERIZATION WITH CORONARY ANGIOGRAM N/A 07/20/2011   Procedure: LEFT HEART CATHETERIZATION WITH CORONARY ANGIOGRAM;  Surgeon: Othella Boyer, MD;  Location: [facility] CATH LAB;  Service: Cardiovascular;  Laterality: N/A;  . SHOULDER ARTHROSCOPY WITH LABRAL REPAIR Left 12/01/2012   Procedure: LEFT SHOULDER ARTHROSCOPY WITH ASPIRATION OF DEGENERATIVE OF THE PARALABRAL CYST and OPEN DECOMPRESSION OF SUPRASCAPULAR NERVE;  Surgeon: Senaida Lange, MD;  Location: [facility];  Service: Orthopedics;  Laterality: Left;  . SHOULDER SURGERY     twice  left shoulder     ROS: As per HPI. All other systems negative.   OBJECTIVE:  Vitals:   07/06/18 1312  BP: (!) 95/54  Pulse: 68  Resp: 18  Temp: (!) 97.2 F (36.2 C)  TempSrc: Oral  SpO2: 96%    General appearance: alert; no distress HEENT: [place]; AT; EOMI; PERRLA Neck: FROM and supple CV: RRR Resp: unlabored; speaks in full sentences Extremities: . LLE: warm; well localized moderate tenderness over bilateral medial and lateral posteror malleoli; without gross deformities; with moderate swelling, more laterally; with no bruising; ROM: limited by pain; unable to bear weight CV: brisk extremity capillary refill of LLE; 1+ DP and PT pulse of LLE. Skin: warm and dry; no visible rashes Neurologic: cannot assess gait secondary to inability to bear weight on LLE; normal reflexes of RLE and LLE; normal sensation of RLE and LLE; normal strength of RLE and LLE Psychological: alert and cooperative; normal mood and affect  Allergies  Allergen Reactions  . Sulfa Antibiotics Hives and Other (See Comments)  . Tetanus Toxoids Swelling and Other (See Comments)    Past Medical History:  Diagnosis Date  . Anxiety   .  Arthritis   . Baker's cyst of knee    left  . Cervical disc disease 07/20/2011  . Complication of anesthesia    " I wake up in a combative mode"  . Coronary artery disease   . Depression   . Elevated cholesterol   . GERD (gastroesophageal reflux disease)   . Headache(784.0)    years ago  . History of hiatal hernia   . Hypertension   . Myocardial infarct [facility])    2001  sees Dr. York Spaniel  . Neuromuscular disorder (HCC)    hx of shingles/ myofacitis  . Shortness of breath    Social History   Socioeconomic History  . Marital status: Married    Spouse name: Adal Sereno  . Number of children: Y  . Years of education: Not on file  . Highest education level: Not on file  Occupational History  . Occupation: retired Research scientist (physical sciences)  . Financial resource strain: Not on file  . Food insecurity:    Worry: Not on file    Inability: Not on file  . Transportation needs:    Medical: Not on file    Non-medical: Not on file  Tobacco Use  . Smoking status: Former Smoker    Packs/day: 0.90    Years: 40.00    Pack years: 36.00    Types: Cigarettes    Last attempt to quit: 07/19/2004    Years since quitting: 13.9  . Smokeless tobacco: Never Used  Substance  and Sexual Activity  . Alcohol use: Yes    Alcohol/week: 0.0 standard drinks    Comment: occasionally  . Drug use: No  . Sexual activity: Never  Lifestyle  . Physical activity:    Days per week: Not on file    Minutes per session: Not on file  . Stress: Not on file  Relationships  . Social connections:    Talks on phone: Not on file    Gets together: Not on file    Attends religious service: Not on file    Active member of club or organization: Not on file    Attends meetings of clubs or organizations: Not on file    Relationship status: Not on file  Other Topics Concern  . Not on file  Social History Narrative  . Not on file   Family History  Problem Relation Age of Onset  . Alcohol abuse Father   .  Hyperlipidemia Father   . Birth defects Brother   . Diabetes Maternal Grandmother    Past Surgical History:  Procedure Laterality Date  . COLONOSCOPY    . CORONARY ANGIOGRAPHY N/A 07/02/2017   Procedure: CORONARY ANGIOGRAPHY;  Surgeon: Kathleene Hazel, MD;  [facility];  Service: Cardiovascular;  Laterality: N/A;  . CORONARY ANGIOPLASTY     x 2  last one 07/2011  . CORONARY STENT INTERVENTION N/A 07/02/2017   Procedure: CORONARY STENT INTERVENTION;  Surgeon: Tonny Bollman, MD;  Location: [facility] INVASIVE CV LAB;  Service: Cardiovascular;  Laterality: N/A;  . CORONARY STENT PLACEMENT  2001   Tetra Multi Link Stent-3T MRI compatible, normal mode  . CORONARY/GRAFT ACUTE MI REVASCULARIZATION N/A 07/02/2017   Procedure: Coronary/Graft Acute MI Revascularization;  Surgeon: Kathleene Hazel, MD;  [facility];  Service: Cardiovascular;  Laterality: N/A;  . LAMINECTOMY AND MICRODISCECTOMY CERVICAL SPINE     January and August 2010  . LEFT HEART CATH AND CORONARY ANGIOGRAPHY N/A 07/02/2017   Procedure: LEFT HEART CATH AND CORONARY ANGIOGRAPHY;  Surgeon: Tonny Bollman, MD;  Location: [facility] INVASIVE CV LAB;  Service: Cardiovascular;  Laterality: N/A;  . LEFT HEART CATHETERIZATION WITH CORONARY ANGIOGRAM N/A 07/20/2011   Procedure: LEFT HEART CATHETERIZATION WITH CORONARY ANGIOGRAM;  Surgeon: Othella Boyer, MD;  [facility] CATH LAB;  Service: Cardiovascular;  Laterality: N/A;  . SHOULDER ARTHROSCOPY WITH LABRAL REPAIR Left 12/01/2012   Procedure: LEFT SHOULDER ARTHROSCOPY WITH ASPIRATION OF DEGENERATIVE OF THE PARALABRAL CYST and OPEN DECOMPRESSION OF SUPRASCAPULAR NERVE;  Surgeon: Senaida Lange, MD;  Location: [facility];  Service: Orthopedics;  Laterality: Left;  . SHOULDER SURGERY     twice  left shoulder      Mardella Layman, MD 07/06/18 [phone]

## 2018-07-06 NOTE — Progress Notes (Signed)
Orthopedic Tech Progress Note Patient Details:  Clayton HugerCarl D Anderson 11/16/1945 [id number]  Ortho Devices Type of Ortho Device: Post (short leg) splint, Stirrup splint Ortho Device/Splint Location: lle  Ortho Device/Splint Interventions: Ordered, Application, Adjustment   Post Interventions Patient Tolerated: Well Instructions Provided: Care of device, Adjustment of device Pt was unable to balance himself while using the crutches. I informed the rn.  Clayton Anderson, Clayton Anderson 07/06/2018, 3:10 PM

## 2018-07-11 ENCOUNTER — Emergency Department
Admission: EM | Admit: 2018-07-11 | Discharge: 2018-07-11 | Disposition: A | Discharge status: Home / Self Care | Payer: Medicare Other | Attending: Emergency Medicine | Admitting: Emergency Medicine

## 2018-07-11 ENCOUNTER — Encounter: Payer: Self-pay | Admitting: Emergency Medicine

## 2018-07-11 DIAGNOSIS — I10 Essential (primary) hypertension: Secondary | ICD-10-CM | POA: Diagnosis not present

## 2018-07-11 DIAGNOSIS — Z7982 Long term (current) use of aspirin: Secondary | ICD-10-CM | POA: Insufficient documentation

## 2018-07-11 DIAGNOSIS — S8262XD Displaced fracture of lateral malleolus of left fibula, subsequent encounter for closed fracture with routine healing: Secondary | ICD-10-CM | POA: Insufficient documentation

## 2018-07-11 DIAGNOSIS — Z955 Presence of coronary angioplasty implant and graft: Secondary | ICD-10-CM | POA: Insufficient documentation

## 2018-07-11 DIAGNOSIS — Z79899 Other long term (current) drug therapy: Secondary | ICD-10-CM | POA: Insufficient documentation

## 2018-07-11 DIAGNOSIS — X58XXXD Exposure to other specified factors, subsequent encounter: Secondary | ICD-10-CM | POA: Diagnosis not present

## 2018-07-11 DIAGNOSIS — Z87891 Personal history of nicotine dependence: Secondary | ICD-10-CM | POA: Diagnosis not present

## 2018-07-11 DIAGNOSIS — S8262XA Displaced fracture of lateral malleolus of left fibula, initial encounter for closed fracture: Secondary | ICD-10-CM

## 2018-07-11 DIAGNOSIS — I252 Old myocardial infarction: Secondary | ICD-10-CM | POA: Insufficient documentation

## 2018-07-11 DIAGNOSIS — I2510 Atherosclerotic heart disease of native coronary artery without angina pectoris: Secondary | ICD-10-CM | POA: Insufficient documentation

## 2018-07-11 DIAGNOSIS — J449 Chronic obstructive pulmonary disease, unspecified: Secondary | ICD-10-CM | POA: Diagnosis not present

## 2018-07-11 MED ORDER — MORPHINE SULFATE (PF) 4 MG/ML IV SOLN
4.0000 mg | Freq: Once | INTRAVENOUS | Status: AC
  Administered 2018-07-11: 4 mg via SUBCUTANEOUS
  Filled 2018-07-11: qty 1

## 2018-07-11 NOTE — ED Notes (Signed)
See triage note  Presents with pain to left ankle  States he was seen last weds d/t having a syncopal episode  Was told he had a fx to left ankle  Was placed in OCL splint and instructed to f/o with ortho  Per wife she has called several times and no one has returned the call  He is having increased pain to leg

## 2018-07-11 NOTE — ED Triage Notes (Signed)
Patient presents to the ED with problem in his left leg.  Patient states he was seen at Urgent Care for a broken leg on Wednesday.  Patient called ortho on Wednesday after discharge.  Patient called ortho today.  Patient states by 3pm ortho had not called back.  Patient states, "I've had enough."  Patient reports extreme pain x 5 days.  Patient states he was told the bones are displaced.

## 2018-07-11 NOTE — ED Provider Notes (Signed)
[facility] Emergency Department Provider Note  ____________________________________________  Time seen: Approximately 5:10 PM  I have reviewed the triage vital signs and the nursing notes.   HISTORY  Chief Complaint Leg Problem    HPI Clayton Anderson is a 72 y.o. male presents to emergency department for evaluation of fibula fracture that happened on Wednesday.  Patient had a syncopal episode and was [place] and was told that he had a fibula fracture.  Splint was placed at urgent care and patient was instructed to follow-up with orthopedics.  He has tried calling [place]opedics in [place] several times for an appointment without any return a phone call.  Patient takes Percocet several times a day for other pain and states that this is not been helping.  He states that he cannot take the pain anymore.  He has been using a walker.  No numbness, tingling.   Past Medical History:  Diagnosis Date  . Anxiety   . Arthritis   . Baker's cyst of knee    left  . Cervical disc disease 07/20/2011  . Complication of anesthesia    " I wake up in a combative mode"  . Coronary artery disease   . Depression   . Elevated cholesterol   . GERD (gastroesophageal reflux disease)   . Headache(784.0)    years ago  . History of hiatal hernia   . Hypertension   . Myocardial infarct [facility](HCC)    2001  sees Dr. York SpanielS Tilley  . Neuromuscular disorder (HCC)    hx of shingles/ myofacitis  . Shortness of breath     Patient Active Problem List   Diagnosis Date Noted  . COPD (chronic obstructive pulmonary disease) (HCC) 05/09/2018  . History of heart attack 05/09/2018  . Preop cardiovascular exam 05/09/2018  . PAF (paroxysmal atrial fibrillation) (HCC)   . Acute ST elevation myocardial infarction (STEMI) involving left anterior descending (LAD) coronary artery (HCC)   . Bilateral occipital neuralgia 10/24/2015  . Cervical post-laminectomy syndrome 06/22/2014   . Chronic pain syndrome 06/22/2014  . Neck pain 06/22/2014  . Chronic pain 04/24/2014  . Major depressive disorder, single episode 04/24/2014  . RLS (restless legs syndrome) 04/24/2014  . Dyspnea 08/17/2011  . Pulmonary nodule 08/17/2011  . CAD (coronary artery disease)   . GERD (gastroesophageal reflux disease)   . Hypertension 07/20/2011  . Hyperlipidemia 07/20/2011  . Cervical disc disease     Past Surgical History:  Procedure Laterality Date  . COLONOSCOPY    . CORONARY ANGIOGRAPHY N/A 07/02/2017   Procedure: CORONARY ANGIOGRAPHY;  Surgeon: Kathleene HazelMcAlhany, Christopher D, MD;  Location: [facility] INVAS[facility] CV LAB;  Service: Cardiovascular;  Laterality: N/A;  . CORONARY ANGIOPLASTY     x 2  last one 07/2011  . CORONARY STENT INTERVENTION N/A 07/02/2017   Procedure: CORONARY STENT INTERVENTION;  Surgeon: Tonny Bollmanooper, Michael, MD;  [facility];  Service: Cardiovascular;  Laterality: N/A;  . CORONARY STENT PLACEMENT  2001   Tetra Multi Link Stent-3T MRI compatible, normal mode  . CORONARY/GRAFT ACUTE MI REVASCULARIZATION N/A 07/02/2017   Procedure: Coronary/Graft Acute MI Revascularization;  Surgeon: Kathleene HazelMcAlhany, Christopher D, MD;  Location: [facility] INVASIVE CV LAB;  Service: Cardiovascular;  Laterality: N/A;  . LAMINECTOMY AND MICRODISCECTOMY CERVICAL SPINE     January and August 2010  . LEFT HEART CATH AND CORONARY ANGIOGRAPHY N/A 07/02/2017   Procedure: LEFT HEART CATH AND CORONARY ANGIOGRAPHY;  Surgeon: Tonny Bollmanooper, Michael, MD;  [facility]  [facility];  Service: Cardiovascular;  Laterality: N/A;  . LEFT HEART CATHETERIZATION WITH CORONARY ANGIOGRAM N/A 07/20/2011   Procedure: LEFT HEART CATHETERIZATION WITH CORONARY ANGIOGRAM;  Surgeon: Othella Boyer, MD;  Location: [facility] CATH LAB;  Service: Cardiovascular;  Laterality: N/A;  . SHOULDER ARTHROSCOPY WITH LABRAL REPAIR Left 12/01/2012   Procedure: LEFT SHOULDER ARTHROSCOPY WITH ASPIRATION OF DEGENERATIVE OF THE PARALABRAL CYST and OPEN  DECOMPRESSION OF SUPRASCAPULAR NERVE;  Surgeon: Senaida Lange, MD;  Location: [facility] OR;  Service: Orthopedics;  Laterality: Left;  . SHOULDER SURGERY     twice  left shoulder    Prior to Admission medications   Medication Sig Start Date End Date Taking? Authorizing Provider  amLODipine (NORVASC) 10 MG tablet Take 1 tablet (10 mg total) by mouth daily. 07/06/17   Othella Boyer, MD  aspirin EC 81 MG tablet Take 81 mg by mouth daily.      [provider]  buPROPion (WELLBUTRIN XL) 150 MG 24 hr tablet Take 150 mg by mouth every morning. 05/23/17   [provider]  carvedilol (COREG) 3.125 MG tablet Take 1 tablet (3.125 mg total) by mouth 2 (two) times daily with a meal. 06/13/18   Georgeanna Lea, MD  DULoxetine (CYMBALTA) 60 MG capsule  01/27/18   [provider]  escitalopram (LEXAPRO) 10 MG tablet Take 10 mg by mouth daily. Reported on 02/03/2016    [provider]  folic acid (FOLVITE) 800 MCG tablet Take 800 mcg by mouth daily. Reported on 02/03/2016    [provider]  gabapentin (NEURONTIN) 300 MG capsule Take 300 mg by mouth at bedtime.     [provider]  Misc. Devices [facility]) MISC Use wheelchair as needed to remain non-weight bearing. 07/06/18   Mardella Layman, MD  nitroGLYCERIN (NITROSTAT) 0.4 MG SL tablet Place 0.4 mg under the tongue every 5 (five) minutes as needed.      [provider]  Oxycodone HCl 20 MG TABS Take 40-60 mg by mouth every 4 (four) hours as needed (pain).    [provider]  ramipril (ALTACE) 10 MG capsule Take 10 mg by mouth daily.      [provider]  rosuvastatin (CRESTOR) 40 MG tablet Take 1 tablet (40 mg total) by mouth daily at 6 PM. 06/13/18   Georgeanna Lea, MD  spironolactone (ALDACTONE) 25 MG tablet Take 0.5 tablets (12.5 mg total) by mouth daily. 07/06/17   Othella Boyer, MD  tiZANidine (ZANAFLEX) 2 MG tablet Take 2-4 mg by mouth 4 (four) times daily as needed  for muscle spasms. 06/07/17   [provider]    Allergies Sulfa antibiotics and Tetanus toxoids  Family History  Problem Relation Age of Onset  . Alcohol abuse Father   . Hyperlipidemia Father   . Birth defects Brother   . Diabetes Maternal Grandmother     Social History Social History   Tobacco Use  . Smoking status: Former Smoker    Packs/day: 0.90    Years: 40.00    Pack years: 36.00    Types: Cigarettes    Last attempt to quit: 07/19/2004    Years since quitting: 13.9  . Smokeless tobacco: Never Used  Substance Use Topics  . Alcohol use: Yes    Alcohol/week: 0.0 standard drinks    Comment: occasionally  . Drug use: No     Review of Systems  Gastrointestinal: No nausea, no vomiting.  Musculoskeletal: Positive for ankle pain. Skin: Negative for  rash, abrasions, lacerations.  Positive for ecchymosis. Neurological: Negative for numbness or tingling   ____________________________________________   PHYSICAL EXAM:  VITAL SIGNS: ED Triage Vitals  Enc Vitals Group     BP 07/11/18 1644 136/71     Pulse Rate 07/11/18 1644 82     Resp 07/11/18 1644 18     Temp 07/11/18 1644 97.8 F (36.6 C)     Temp Source 07/11/18 1644 Oral     SpO2 07/11/18 1644 96 %     Weight 07/11/18 1645 190 lb (86.2 kg)     Height 07/11/18 1645 5\' 9"  (1.753 m)     Head Circumference --      Peak Flow --      Pain Score 07/11/18 1644 9     Pain Loc --      Pain Edu? --      Excl. in GC? --      Constitutional: Alert and oriented. Well appearing and in no acute distress. Eyes: Conjunctivae are normal. PERRL. EOMI. Head: Atraumatic. ENT:      Ears:      Nose: No congestion/rhinnorhea.      Mouth/Throat: Mucous membranes are moist.  Neck: No stridor.  Cardiovascular: Normal rate, regular rhythm.  Good peripheral circulation.  Cap refill less than 2 seconds. Respiratory: Normal respiratory effort without tachypnea or retractions. Lungs CTAB. Good air entry to the bases  with no decreased or absent breath sounds. Musculoskeletal: Full range of motion to all extremities. No gross deformities appreciated.  Moderate swelling and ecchymosis to foot.  Foot is warm to touch. Neurologic:  Normal speech and language. No gross focal neurologic deficits are appreciated.  Skin:  Skin is warm, dry and intact. No rash noted. Psychiatric: Mood and affect are normal. Speech and behavior are normal. Patient exhibits appropriate insight and judgement.   ____________________________________________   LABS (all labs ordered are listed, but only abnormal results are displayed)  Labs Reviewed - No data to display ____________________________________________  EKG   ____________________________________________  RADIOLOGY   No results found.  ____________________________________________    PROCEDURES  Procedure(s) performed:    Procedures    Medications  morphine 4 MG/ML injection 4 mg (4 mg Subcutaneous Given 07/11/18 1827)     ____________________________________________   INITIAL IMPRESSION / ASSESSMENT AND PLAN / ED COURSE  Pertinent labs & imaging results that were available during my care of the patient were reviewed by me and considered in my medical decision making (see chart for details).  Review of the [place] CSRS was performed in accordance of the [facility] prior to dispensing any controlled drugs.     Patient presented to the emergency department for continued pain with distal fibula fracture 5 days ago and unable to get a hold of orthopedics.  Vital signs and exam are reassuring.  Splint was removed and replaced in ED.  Patient has been using a walker and will continue.  Dr. Hyacinth Meeker was consulted, reviewed images and will follow up with patient tomorrow in clinic.  Patient received a dose of morphine in the emergency department.  He will continue his Percocet that he takes for chronic pain.  Patient is to follow up with orthopedics as directed.  Patient is given ED precautions to return to the ED for any worsening or new symptoms.     ____________________________________________  FINAL CLINICAL IMPRESSION(S) / ED DIAGNOSES  Final diagnoses:  Closed displaced fracture of lateral malleolus of left fibula, initial encounter  NEW MEDICATIONS STARTED DURING THIS VISIT:  ED Discharge Orders    None          This chart was dictated using voice recognition software/Dragon. Despite best efforts to proofread, errors can occur which can change the meaning. Any change was purely unintentional.    Enid Derry, PA-C 07/11/18 1949    Don Perking, [place], MD 07/12/18 [phone]

## 2018-07-11 NOTE — Discharge Instructions (Signed)
Please call Dr. Rondel BatonMiller's office in the morning for an appointment tomorrow.  He will be expecting you.

## 2018-07-12 DIAGNOSIS — S8263XA Displaced fracture of lateral malleolus of unspecified fibula, initial encounter for closed fracture: Secondary | ICD-10-CM | POA: Insufficient documentation

## 2018-08-01 ENCOUNTER — Other Ambulatory Visit: Payer: Self-pay

## 2018-08-01 ENCOUNTER — Telehealth: Payer: Self-pay | Admitting: Cardiology

## 2018-08-01 MED ORDER — AMLODIPINE BESYLATE 10 MG PO TABS: 10.0000 mg | ORAL_TABLET | Freq: Every day | ORAL | 12 refills | Status: AC

## 2018-08-01 NOTE — Telephone Encounter (Signed)
° ° °  1. Which medications need to be refilled? (please list name of each medication and dose if known) Amlodipine besylate 10mg  tablet 1QD  2. Which pharmacy/location (including street and city if local pharmacy) is medication to be sent to?[facility]  3. Do they need a 30 day or 90 day supply? 30

## 2018-08-31 ENCOUNTER — Telehealth: Payer: Self-pay | Admitting: Cardiology

## 2018-08-31 NOTE — Telephone Encounter (Signed)
° °  1. Which medications need to be refilled? (please list name of each medication and dose if known) Spironolactone 25mg  tablet 1/2 tablet daily  2. Which pharmacy/location (including street and city if local pharmacy) is medication to be sent to? [facility]  3. Do they need a 30 day or 90 day supply? 30

## 2018-09-01 ENCOUNTER — Other Ambulatory Visit: Payer: Self-pay

## 2018-09-01 MED ORDER — SPIRONOLACTONE 25 MG PO TABS: 12.5000 mg | ORAL_TABLET | Freq: Every day | ORAL | Status: DC

## 2018-09-02 ENCOUNTER — Other Ambulatory Visit: Payer: Self-pay

## 2018-09-02 MED ORDER — SPIRONOLACTONE 25 MG PO TABS: 12.5000 mg | ORAL_TABLET | Freq: Every day | ORAL | 3 refills | Status: DC

## 2018-09-06 DIAGNOSIS — R441 Visual hallucinations: Secondary | ICD-10-CM | POA: Insufficient documentation

## 2018-09-07 ENCOUNTER — Encounter: Payer: Self-pay | Admitting: Cardiovascular Disease

## 2018-09-07 ENCOUNTER — Ambulatory Visit: Payer: Medicare Other | Admitting: Cardiovascular Disease

## 2018-09-07 VITALS — BP 123/74 | HR 67 | Ht 69.0 in | Wt 196.4 lb

## 2018-09-07 DIAGNOSIS — R0989 Other specified symptoms and signs involving the circulatory and respiratory systems: Secondary | ICD-10-CM | POA: Diagnosis not present

## 2018-09-07 DIAGNOSIS — I2510 Atherosclerotic heart disease of native coronary artery without angina pectoris: Secondary | ICD-10-CM

## 2018-09-07 DIAGNOSIS — E782 Mixed hyperlipidemia: Secondary | ICD-10-CM | POA: Diagnosis not present

## 2018-09-07 DIAGNOSIS — I10 Essential (primary) hypertension: Secondary | ICD-10-CM

## 2018-09-07 NOTE — Assessment & Plan Note (Signed)
History of CAD status post remote LAD stenting in 2001 with recent LAD stenting by Dr. Excell Seltzer 06/21/2018 with 2 overlapping DES stents.  He had acute stent thrombosis in the holding area and was brought back by Dr. Clifton James to the Cath Lab where he underwent re-intervention.  He is remained on dual antiplatelet therapy since then denies chest pain or shortness of breath.

## 2018-09-07 NOTE — Assessment & Plan Note (Addendum)
History of hyperlipidemia on statin therapy with lipid profile performed 09/13/2017 revealed a total cholesterol 160, LDL of 73 and HDL of 45

## 2018-09-07 NOTE — Progress Notes (Signed)
09/07/2018 Clayton HugerCarl D Care   01/20/1946  [id number]  Primary Physician Kandyce RudBabaoff, Marcus, MD Primary Cardiologist: Runell GessJonathan J Tarrance Januszewski MD Roseanne RenoFACP, FACC, FAHA, FSCAI  HPI:  Clayton Anderson is a 73 y.o. 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  He was referred for preoperative clearance before deep brain stimulation for neck pain [facility].  He is previously a patient of Dr. Donnie Ahoilley and has seen Dr. Tomie Chinaevankar in the past as well.  His risk factors include treated hypertension hyperlipidemia.  He has a history of remote tobacco abuse.  He had LAD and circumflex stenting back in 2001 as well as a recent procedure by Dr. Excell Seltzerooper 07/02/2017 with stenting of his proximal LAD using overlapping DES stents.  He unfortunately had acute stent thrombosis in the holding area and was brought back to the Cath Lab by Dr. Clifton JamesMcAlhany requiring re-intervention.  His EF is in the 45% range by 2D echo performed 07/03/2017.  He denies chest pain or shortness of breath.   Current Meds  Medication Sig  . amLODipine (NORVASC) 10 MG tablet Take 1 tablet (10 mg total) by mouth daily.  Marland Kitchen. aspirin EC 81 MG tablet Take 81 mg by mouth daily.    Marland Kitchen. buPROPion (WELLBUTRIN XL) 150 MG 24 hr tablet Take 150 mg by mouth every morning.  . carvedilol (COREG) 3.125 MG tablet Take 1 tablet (3.125 mg total) by mouth 2 (two) times daily with a meal.  . DULoxetine (CYMBALTA) 60 MG capsule   . escitalopram (LEXAPRO) 10 MG tablet Take 10 mg by mouth daily. Reported on 02/03/2016  . folic acid (FOLVITE) 800 MCG tablet Take 800 mcg by mouth daily. Reported on 02/03/2016  . gabapentin (NEURONTIN) 300 MG capsule Take 300 mg by mouth at bedtime.   . Misc. Devices [facility](WHEELCHAIR) MISC Use wheelchair as needed to remain non-weight bearing.  . nitroGLYCERIN (NITROSTAT) 0.4 MG SL tablet Place 0.4 mg under the tongue every 5 (five) minutes as needed.    . Oxycodone HCl 20 MG TABS Take 40-60 mg by mouth every 4 (four) hours  as needed (pain).  . ramipril (ALTACE) 10 MG capsule Take 10 mg by mouth daily.    . rosuvastatin (CRESTOR) 40 MG tablet Take 1 tablet (40 mg total) by mouth daily at 6 PM.  . spironolactone (ALDACTONE) 25 MG tablet Take 0.5 tablets (12.5 mg total) by mouth daily.  Marland Kitchen. tiZANidine (ZANAFLEX) 2 MG tablet Take 2-4 mg by mouth 4 (four) times daily as needed for muscle spasms.     Allergies  Allergen Reactions  . Sulfa Antibiotics Hives and Other (See Comments)  . Tetanus Toxoids Swelling and Other (See Comments)    Social History   Socioeconomic History  . Marital status: Married    Spouse name: Edwyna ReadyMartha Lybrand  . Number of children: Y  . Years of education: Not on file  . Highest education level: Not on file  Occupational History  . Occupation: retired Research scientist (physical sciences)mechanic  Social Needs  . Financial resource strain: Not on file  . Food insecurity:    Worry: Not on file    Inability: Not on file  . Transportation needs:    Medical: Not on file    Non-medical: Not on file  Tobacco Use  . Smoking status: Former Smoker    Packs/day: 0.90    Years: 40.00    Pack years: 36.00    Types: Cigarettes    Last attempt  to quit: 07/19/2004    Years since quitting: 14.1  . Smokeless tobacco: Never Used  Substance and Sexual Activity  . Alcohol use: Yes    Alcohol/week: 0.0 standard drinks    Comment: occasionally  . Drug use: No  . Sexual activity: Never  Lifestyle  . Physical activity:    Days per week: Not on file    Minutes per session: Not on file  . Stress: Not on file  Relationships  . Social connections:    Talks on phone: Not on file    Gets together: Not on file    Attends religious service: Not on file    Active member of club or organization: Not on file    Attends meetings of clubs or organizations: Not on file    Relationship status: Not on file  . Intimate partner violence:    Fear of current or ex partner: Not on file    Emotionally abused: Not on file    Physically abused:  Not on file    Forced sexual activity: Not on file  Other Topics Concern  . Not on file  Social History Narrative  . Not on file     Review of Systems: General: negative for chills, fever, night sweats or weight changes.  Cardiovascular: negative for chest pain, dyspnea on exertion, edema, orthopnea, palpitations, paroxysmal nocturnal dyspnea or shortness of breath Dermatological: negative for rash Respiratory: negative for cough or wheezing Urologic: negative for hematuria Abdominal: negative for nausea, vomiting, diarrhea, bright red blood per rectum, melena, or hematemesis Neurologic: negative for visual changes, syncope, or dizziness All other systems reviewed and are otherwise negative except as noted above.    Blood pressure 123/74, pulse 67, height 5\' 9"  (1.753 m), weight 196 lb 6.4 oz (89.1 kg), SpO2 96 %.  General appearance: alert and no distress Neck: no adenopathy, no JVD, supple, symmetrical, trachea midline, thyroid not enlarged, symmetric, no tenderness/mass/nodules and Left carotid bruit Lungs: clear to auscultation bilaterally Heart: regular rate and rhythm, S1, S2 normal, no murmur, click, rub or gallop Extremities: extremities normal, atraumatic, no cyanosis or edema Pulses: 2+ and symmetric Skin: Skin color, texture, turgor normal. No rashes or lesions Neurologic: Alert and oriented X 3, normal strength and tone. Normal symmetric reflexes. Normal coordination and gait  EKG not performed today  ASSESSMENT AND PLAN:   Hypertension She of essential hypertension with blood pressure measured at 123/74.  He is on amlodipine, carvedilol and ramipril.  Hyperlipidemia History of hyperlipidemia on statin therapy with lipid profile performed 09/13/2017 revealed a total cholesterol 160, LDL of 73 and HDL of 45  CAD (coronary artery disease) History of CAD status post remote LAD stenting in 2001 with recent LAD stenting by Dr. Excell Seltzerooper 06/21/2018 with 2 overlapping DES  stents.  He had acute stent thrombosis in the holding area and was brought back by Dr. Clifton JamesMcAlhany to the Cath Lab where he underwent re-intervention.  He is remained on dual antiplatelet therapy since then denies chest pain or shortness of breath.      Runell GessJonathan J. Reynold Mantell MD FACP,FACC,FAHA, [facility] 09/07/2018 10:06 AM

## 2018-09-07 NOTE — Assessment & Plan Note (Signed)
She of essential hypertension with blood pressure measured at 123/74.  He is on amlodipine, carvedilol and ramipril.

## 2018-09-07 NOTE — Patient Instructions (Addendum)
Medication Instructions:  PLEASE HOLD YOUR PLAVIX 7 DAYS BEFORE YOUR UPCOMING PROCEDURE If you need a refill on your cardiac medications before your next appointment, please call your pharmacy.   Lab work: NONE If you have labs (blood work) drawn today and your tests are completely normal, you will receive your results only by: Marland Kitchen MyChart Message (if you have MyChart) OR . A paper copy in the mail If you have any lab test that is abnormal or we need to change your treatment, we will call you to review the results.  Testing/Procedures: Your physician has requested that you have a carotid duplex. This test is an ultrasound of the carotid arteries in your neck. It looks at blood flow through these arteries that supply the brain with blood. Allow one hour for this exam. There are no restrictions or special instructions.   Follow-Up: At [facility], you and your health needs are our priority.  As part of our continuing mission to provide you with exceptional heart care, we have created designated Provider Care Teams.  These Care Teams include your primary Cardiologist (physician) and Advanced Practice Providers (APPs -  Physician Assistants and Nurse Practitioners) who all work together to provide you with the care you need, when you need it. . You will need a follow up appointment in 6 months.  Please call our office 2 months in advance to schedule this appointment.  You may see Dr. Allyson Sabal or one of the following Advanced Practice Providers on your designated Care Team:   . Corine Shelter, [place] . Azalee Course, PA-C . Micah Flesher, PA-C . Joni Reining, DNP . Theodore Demark, PA-C . Judy Pimple, PA-C . Marjie Skiff, PA-C  Any Other Special Instructions Will Be Listed Below (If Applicable). YOU HAVE BEEN CLEARED AT LOW RISK FROM A CARDIAC STANDPOINT FOR YOUR UPCOMING PROCEDURE.  PLEASE HOLD YOU PLAVIX FOR 7 DAYS BEFORE YOUR SCHEDULED PROCEDURE.

## 2018-09-13 ENCOUNTER — Other Ambulatory Visit: Payer: Self-pay | Admitting: Cardiology

## 2018-09-16 ENCOUNTER — Encounter: Payer: Self-pay | Admitting: *Deleted

## 2018-09-16 ENCOUNTER — Ambulatory Visit (HOSPITAL_COMMUNITY)
Admission: RE | Admit: 2018-09-16 | Discharge: 2018-09-16 | Disposition: A | Discharge status: Home / Self Care | Payer: Medicare Other | Source: Ambulatory Visit | Attending: Cardiovascular Disease | Admitting: Cardiovascular Disease

## 2018-09-16 ENCOUNTER — Other Ambulatory Visit: Payer: Self-pay | Admitting: *Deleted

## 2018-09-16 DIAGNOSIS — R0989 Other specified symptoms and signs involving the circulatory and respiratory systems: Secondary | ICD-10-CM | POA: Insufficient documentation

## 2018-09-16 MED ORDER — CARVEDILOL 3.125 MG PO TABS: 3.1250 mg | ORAL_TABLET | Freq: Two times a day (BID) | ORAL | 1 refills | Status: AC

## 2018-09-16 MED ORDER — ROSUVASTATIN CALCIUM 40 MG PO TABS: 40.0000 mg | ORAL_TABLET | Freq: Every day | ORAL | 1 refills | Status: AC

## 2018-12-03 IMAGING — DX DG CHEST 2V
2 series · 2 of 2 positions shown · non-contrast
Comparison: Chest x-ray 07/20/2011.  Chest CT 11/17/2013.

CLINICAL DATA: 71-year-old male with history of chest pain,
shortness of breath and dizziness worsening over the past 4 days.

EXAM:
CHEST  2 VIEW

[chest pa]
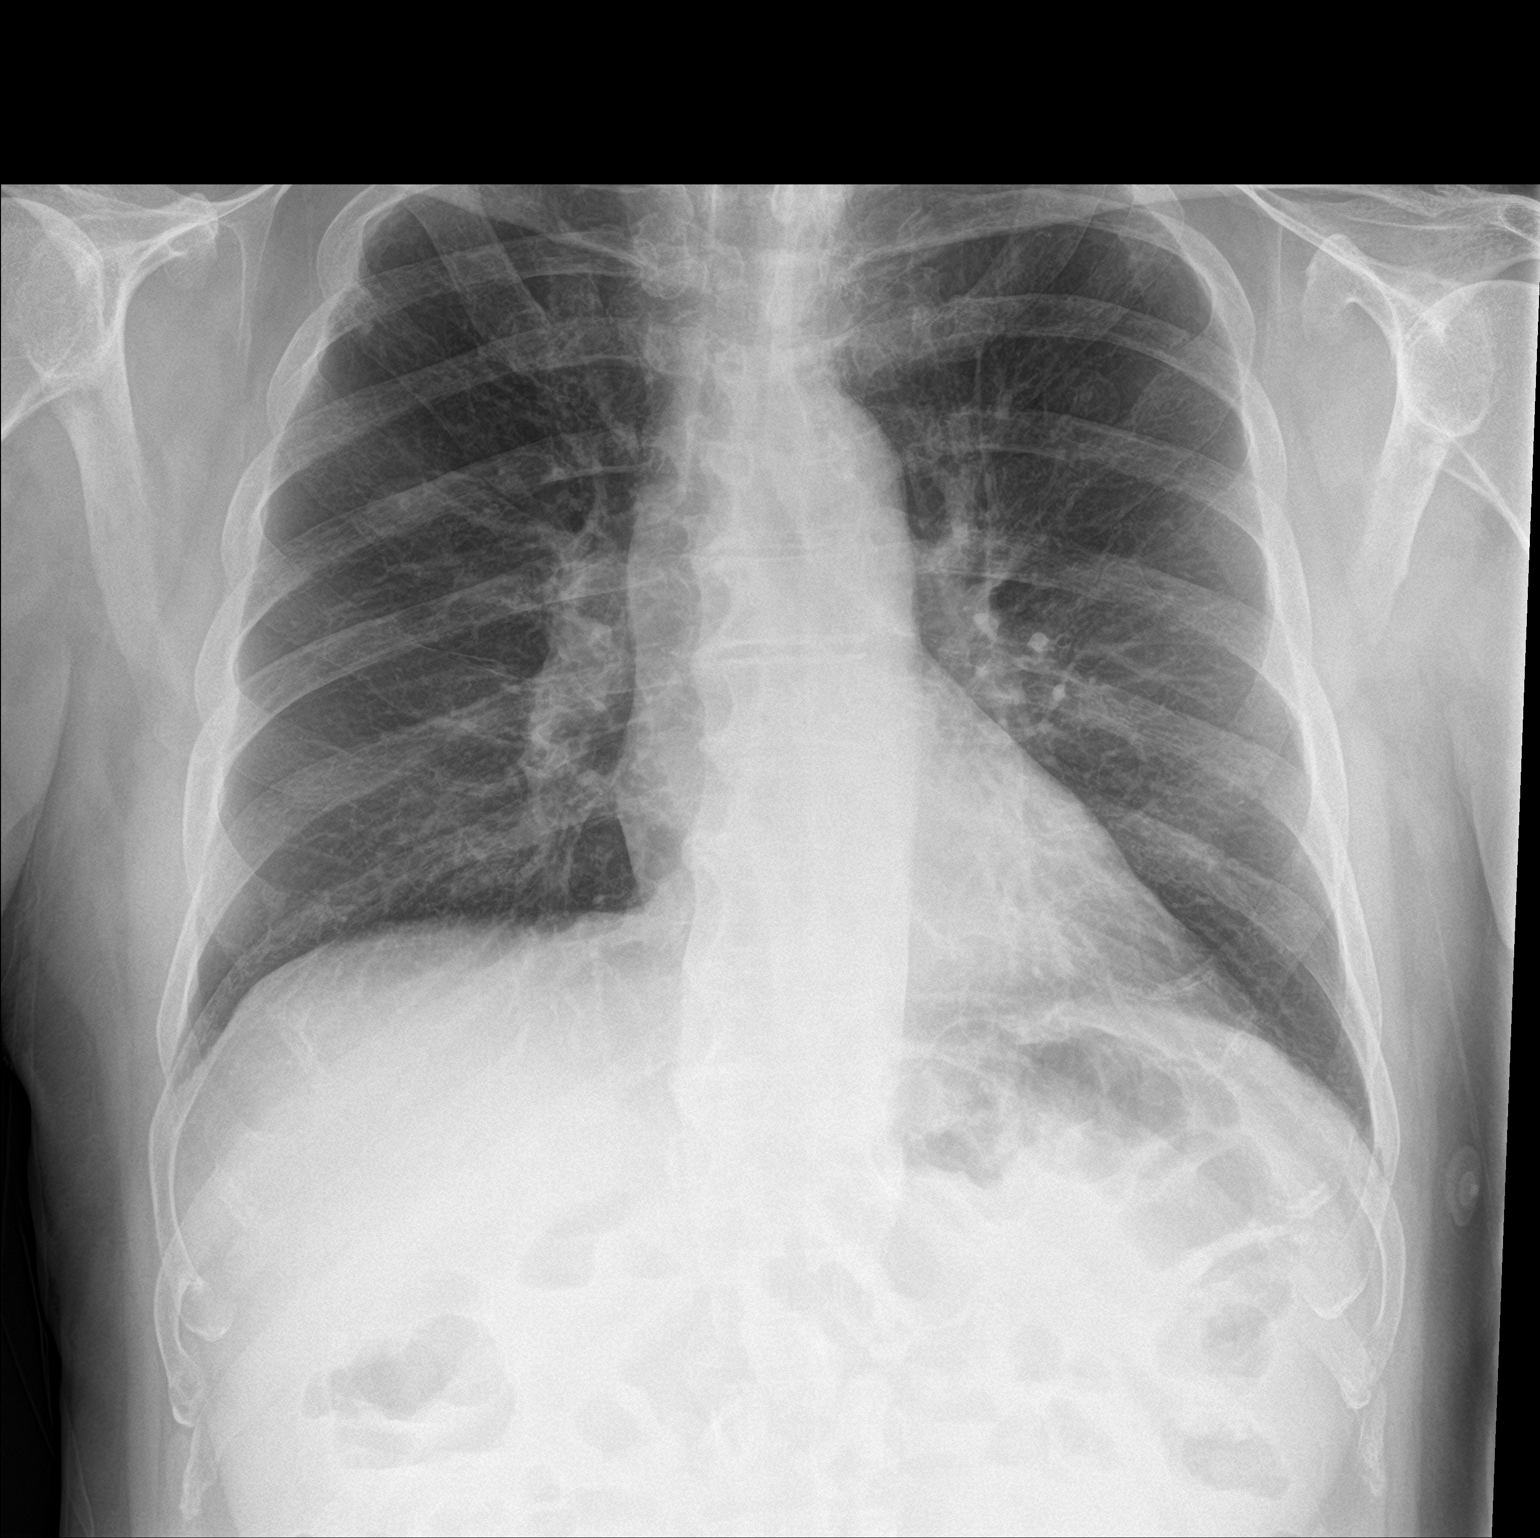

[chest lat]
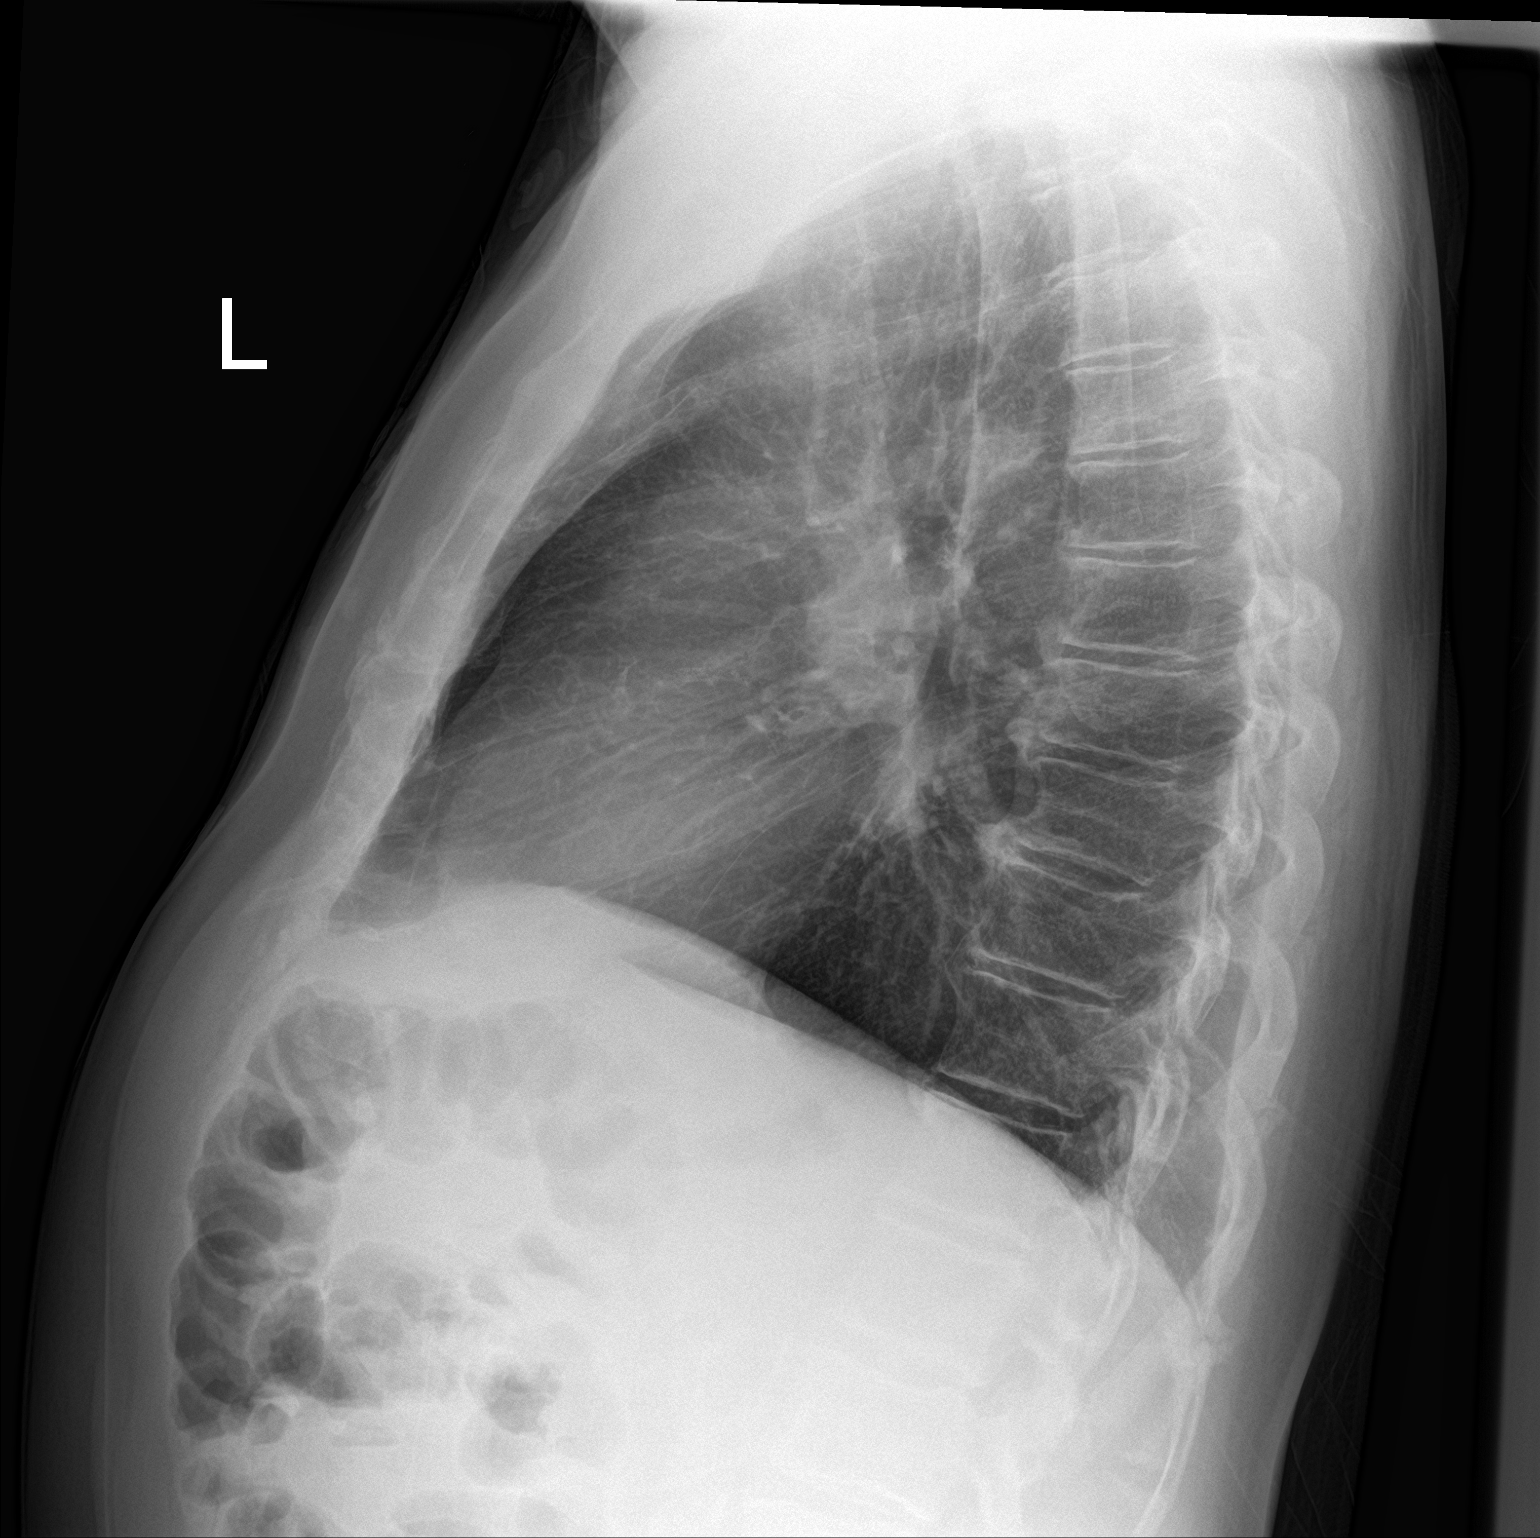

[2 of 2 positions shown; findings below may reference images not displayed]

FINDINGS: Lung volumes are normal. No consolidative airspace disease. No
pleural effusions. No pneumothorax. No pulmonary nodule or mass
noted. Pulmonary vasculature and the cardiomediastinal silhouette
are within normal limits. Atherosclerosis in the thoracic aorta.
Orthopedic fixation hardware incompletely visualized in the lower
cervical spine.
IMPRESSION: 1.  No radiographic evidence of acute cardiopulmonary disease.
2. Aortic atherosclerosis.

## 2018-12-03 IMAGING — DX DG ANKLE 2V *R*
1 series · 2 of 2 positions shown · non-contrast
Comparison: None.

CLINICAL DATA: Right ankle pain. The patient reports suffering a
fall in the past week. Initial encounter.

EXAM:
RIGHT ANKLE - 2 VIEW

[Series 1: ankle · 0.14mm/px · 2 of 2 slices shown]
[im 1/2]
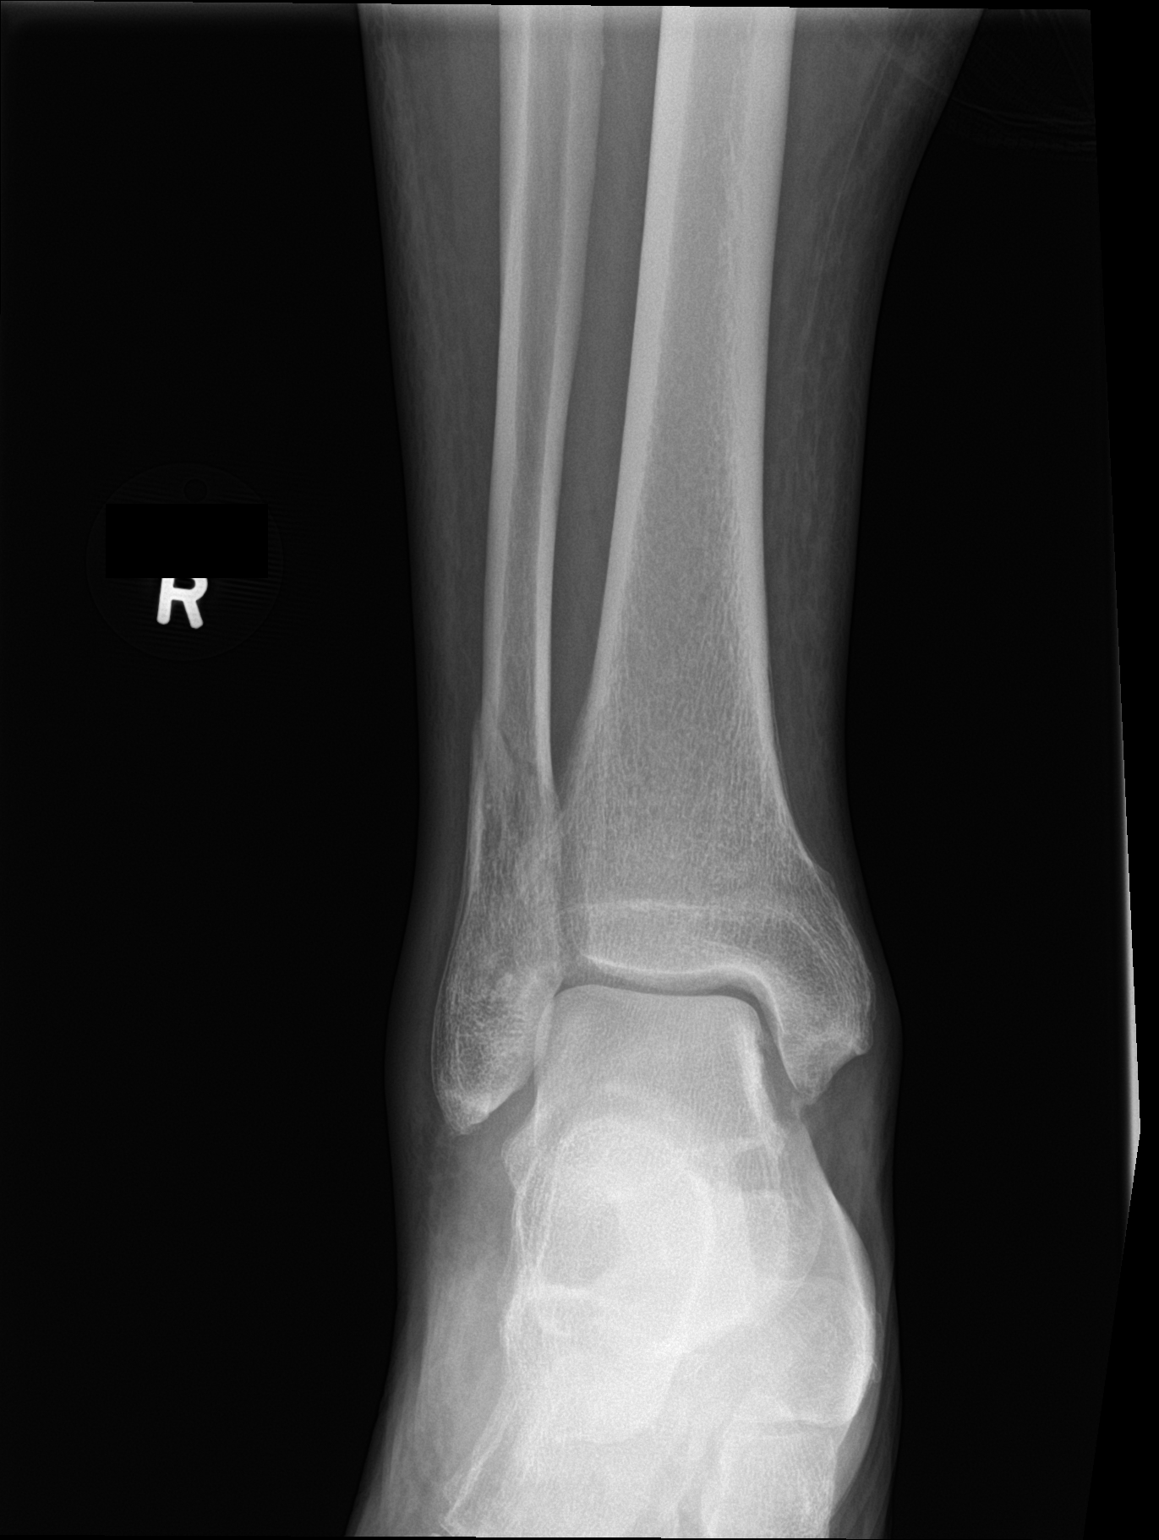
[im 2/2]
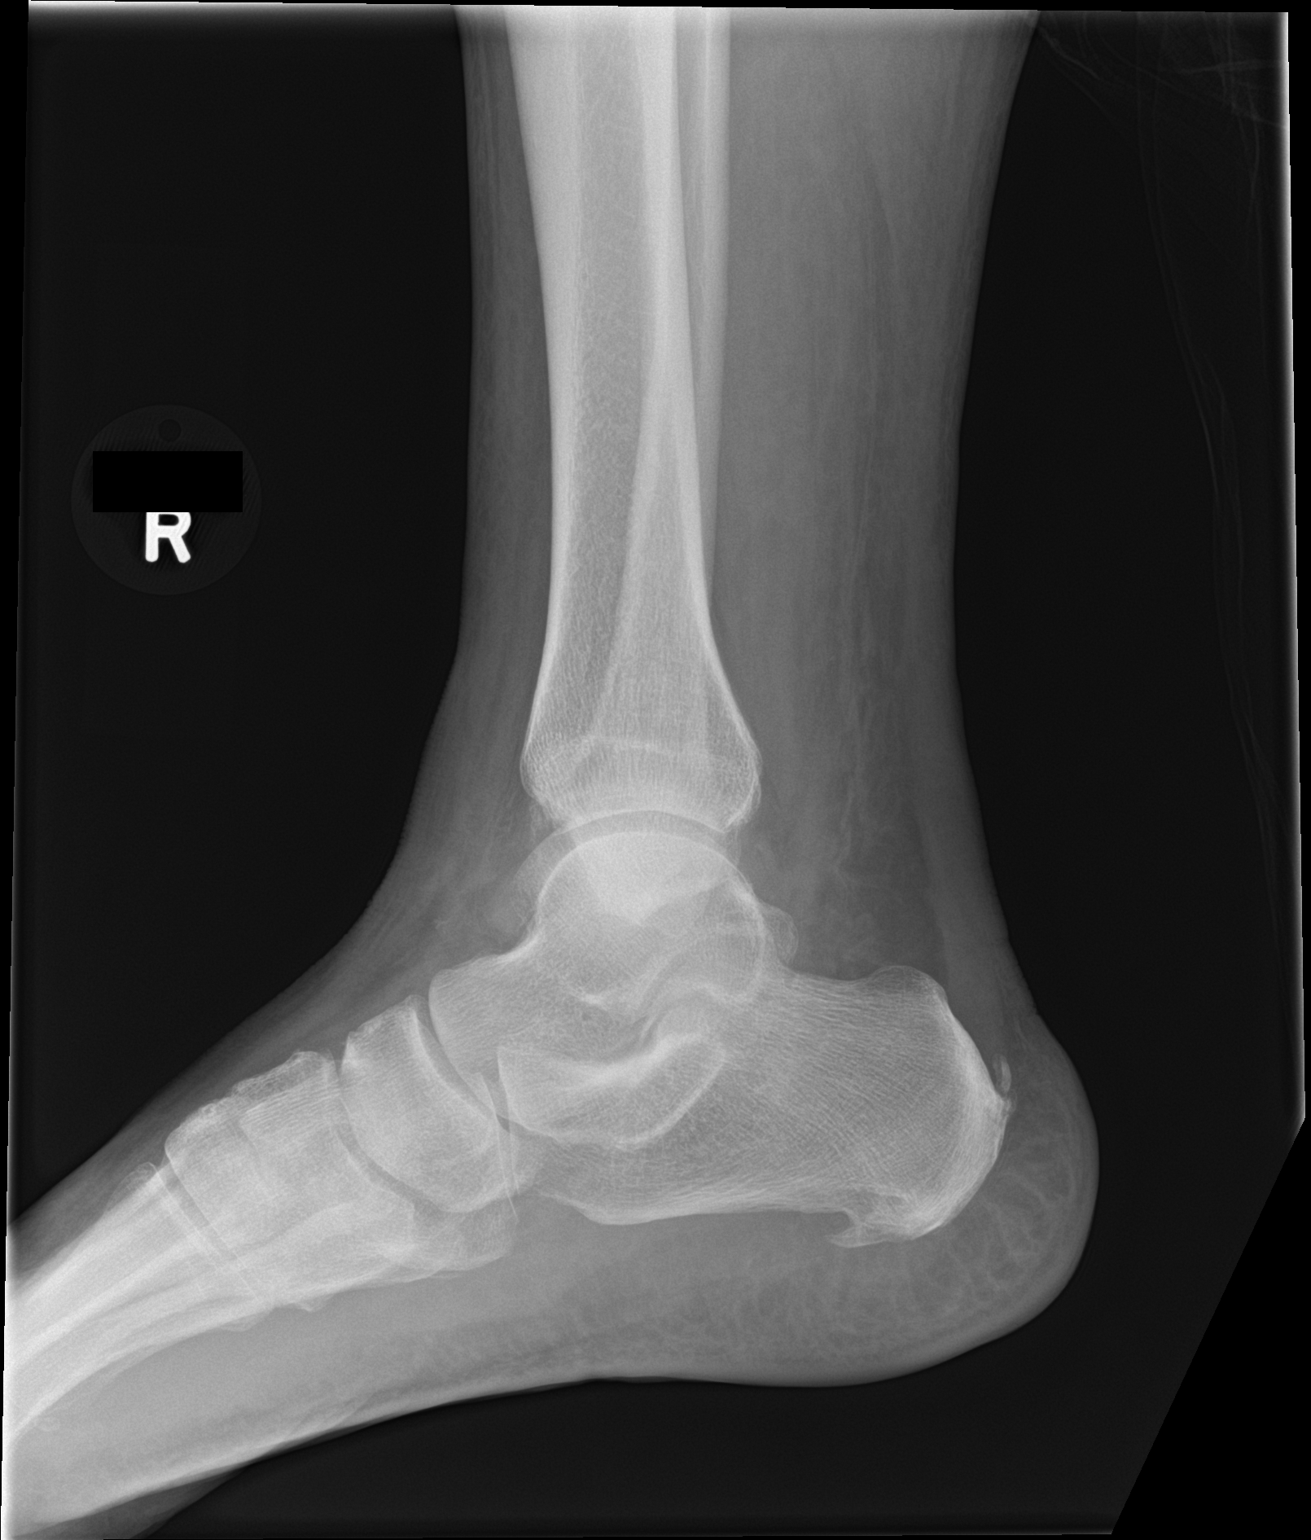

[2 of 2 positions shown; findings below may reference images not displayed]

FINDINGS: There is some soft tissue swelling about the lateral aspect of the
ankle. The patient has a nondisplaced fracture of the distal
diaphysis of the fibula. No other acute bony abnormality is seen.
Calcaneal spur noted.
IMPRESSION: Nondisplaced fracture distal diaphysis right fibula.

## 2019-03-07 ENCOUNTER — Telehealth: Payer: Self-pay

## 2019-03-07 ENCOUNTER — Telehealth (INDEPENDENT_AMBULATORY_CARE_PROVIDER_SITE_OTHER): Payer: Medicare Other | Admitting: Cardiovascular Disease

## 2019-03-07 ENCOUNTER — Telehealth: Payer: Self-pay | Admitting: Cardiovascular Disease

## 2019-03-07 DIAGNOSIS — I2102 ST elevation (STEMI) myocardial infarction involving left anterior descending coronary artery: Secondary | ICD-10-CM

## 2019-03-07 DIAGNOSIS — E782 Mixed hyperlipidemia: Secondary | ICD-10-CM | POA: Diagnosis not present

## 2019-03-07 DIAGNOSIS — I2510 Atherosclerotic heart disease of native coronary artery without angina pectoris: Secondary | ICD-10-CM

## 2019-03-07 DIAGNOSIS — I10 Essential (primary) hypertension: Secondary | ICD-10-CM

## 2019-03-07 NOTE — Telephone Encounter (Signed)
This is for refill pool, not pharmacy

## 2019-03-07 NOTE — Progress Notes (Signed)
Virtual Visit via Telephone Note   This visit type was conducted due to national recommendations for restrictions regarding the COVID-19 Pandemic (e.g. social distancing) in an effort to limit this patient's exposure and mitigate transmission in our community.  Due to his co-morbid illnesses, this patient is at least at moderate risk for complications without adequate follow up.  This format is felt to be most appropriate for this patient at this time.  The patient did not have access to video technology/had technical difficulties with video requiring transitioning to audio format only (telephone).  All issues noted in this document were discussed and addressed.  No physical exam could be performed with this format.  Please refer to the patient's chart for his  consent to telehealth for [facility].   Date:  03/07/2019   ID:  Clayton Anderson, DOB [birth year], MRN [id number]  Patient Location: Home Provider Location: Home  PCP:  Derinda Late, MD  Cardiologist: Dr. Quay Burow Electrophysiologist:  None   Evaluation Performed:  Follow-Up Visit  Chief Complaint: Follow-up CAD, hypertension hyperlipidemia  History of Present Illness:    Clayton Anderson is a 73 y.o. mildly overweight married Caucasian male father of 1 child who is retired from [employer] claims handler.  He was referred for preoperative clearance before deep brain stimulation for nec I last saw him in the office 09/07/2018.  K pain at [facility].  He is previously a patient of Dr. Wynonia Lawman and has seen Dr. Geraldo Pitter in the past as well.  His risk factors include treated hypertension hyperlipidemia.  He has a history of remote tobacco abuse.  He had LAD and circumflex stenting back in 2001 as well as a recent procedure by Dr. Burt Knack 07/02/2017 with stenting of his proximal LAD using overlapping DES stents.  He unfortunately had acute stent thrombosis in the holding area and was brought back to the Cath Lab by Dr. Angelena Form requiring re-intervention.  His  EF is in the 45% range by 2D echo performed 07/03/2017.  Since I saw him 6 months ago he continues to do well.  He is following guidelines regarding COVID-19.  He denies chest pain or shortness of breath.   The patient does not have symptoms concerning for COVID-19 infection (fever, chills, cough, or new shortness of breath).    Past Medical History:  Diagnosis Date   Anxiety    Arthritis    Baker's cyst of knee    left   Cervical disc disease 70/62/3762   Complication of anesthesia    " I wake up in a combative mode"   Coronary artery disease    Depression    Elevated cholesterol    GERD (gastroesophageal reflux disease)    Headache(784.0)    years ago   History of hiatal hernia    Hypertension    Myocardial infarct [facility])    2001  sees Dr. Thurman Coyer   Neuromuscular disorder [facility])    hx of shingles/ myofacitis   Shortness of breath    Past Surgical History:  Procedure Laterality Date   COLONOSCOPY     CORONARY ANGIOGRAPHY N/A 07/02/2017   Procedure: CORONARY ANGIOGRAPHY;  Surgeon: Burnell Blanks, MD;  Location: [place] [facility];  Service: Cardiovascular;  Laterality: N/A;   CORONARY ANGIOPLASTY     x 2  last one 07/2011   CORONARY STENT INTERVENTION N/A 07/02/2017   Procedure: CORONARY STENT INTERVENTION;  Surgeon: Sherren Mocha, MD;  Location: [place] [facility];  Service: Cardiovascular;  Laterality: N/A;   CORONARY STENT PLACEMENT  2001   Tetra Multi Link Stent-3T MRI compatible, normal mode   CORONARY/GRAFT ACUTE MI REVASCULARIZATION N/A 07/02/2017   Procedure: Coronary/Graft Acute MI Revascularization;  Surgeon: Kathleene HazelMcAlhany, Christopher D, MD;  Location: [facility] IN[facility] LAB;  Service: Cardiovascular;  Laterality: N/A;   LAMINECTOMY AND MICRODISCECTOMY CERVICAL SPINE     January and August 2010   LEFT HEART CATH AND CORONARY ANGIOGRAPHY N/A 07/02/2017   Procedure: LEFT HEART CATH AND CORONARY ANGIOGRAPHY;  Surgeon: Tonny Bollmanooper, Michael, MD;   [facility];  Service: Cardiovascular;  Laterality: N/A;   LEFT HEART CATHETERIZATION WITH CORONARY ANGIOGRAM N/A 07/20/2011   Procedure: LEFT HEART CATHETERIZATION WITH CORONARY ANGIOGRAM;  Surgeon: Othella BoyerWilliam S Tilley, MD;  [facility];  Service: Cardiovascular;  Laterality: N/A;   SHOULDER ARTHROSCOPY WITH LABRAL REPAIR Left 12/01/2012   Procedure: LEFT SHOULDER ARTHROSCOPY WITH ASPIRATION OF DEGENERATIVE OF THE PARALABRAL CYST and OPEN DECOMPRESSION OF SUPRASCAPULAR NERVE;  Surgeon: Senaida LangeKevin M Supple, MD;  Location: [facility] OR;  Service: Orthopedics;  Laterality: Left;   SHOULDER SURGERY     twice  left shoulder     No outpatient medications have been marked as taking for the 03/07/19 encounter (Appointment) with Runell GessBerry, Tom Macpherson J, MD.     Allergies:   Sulfa antibiotics and Tetanus toxoids   Social History   Tobacco Use   Smoking status: Former Smoker    Packs/day: 0.90    Years: 40.00    Pack years: 36.00    Types: Cigarettes    Quit date: 07/19/2004    Years since quitting: 14.6   Smokeless tobacco: Never Used  Substance Use Topics   Alcohol use: Yes    Alcohol/week: 0.0 standard drinks    Comment: occasionally   Drug use: No     Family Hx: The patient's family history includes Alcohol abuse in his father; Birth defects in his brother; Diabetes in his maternal grandmother; Hyperlipidemia in his father.  ROS:   Please see the history of present illness.     All other systems reviewed and are negative.   Prior CV studies:   The following studies were reviewed today:  None  Labs/Other Tests and Data Reviewed:    EKG:  No ECG reviewed.  Recent Labs: 05/03/2018: ALT 14; BUN 31; Creatinine, Ser 1.78; Hemoglobin 13.5; Platelets 292; Potassium 5.4; Sodium 132   Recent Lipid Panel Lab Results  Component Value Date/Time   CHOL 219 (H) 07/03/2017 01:54 AM   TRIG 272 (H) 07/03/2017 01:54 AM   HDL 34 (L) 07/03/2017 01:54 AM   CHOLHDL 6.4  07/03/2017 01:54 AM   LDLCALC 131 (H) 07/03/2017 01:54 AM    Wt Readings from Last 3 Encounters:  09/07/18 196 lb 6.4 oz (89.1 kg)  07/11/18 190 lb (86.2 kg)  05/19/18 192 lb (87.1 kg)     Objective:    Vital Signs:  There were no vitals taken for this visit.   VITAL SIGNS:  reviewed a complete physical exam was not performed today since this was a virtual telemedicine phone visit  ASSESSMENT & PLAN:    1. Coronary artery disease- history of CAD status post LAD and circumflex stenting back in 2001.  He had restenting of his LAD by Dr. Excell Seltzerooper 07/02/2017 and acute stent thrombosis in the recovery area with re-intervention by Dr. Clifton JamesMcAlhany.  His EF is 45% by 2D echo.  He denies chest pain or shortness of breath. 2. Essential hypertension-history of  essential hypertension blood pressure measured today 154/74.  He is on spironolactone, ramipril, amlodipine and carvedilol 3. Hyperlipidemia- history of hyperlipidemia on Crestor followed by his PCP  COVID-19 Education: The signs and symptoms of COVID-19 were discussed with the patient and how to seek care for testing (follow up with PCP or arrange E-visit).  The importance of social distancing was discussed today.  Time:   Today, I have spent 6 minutes with the patient with telehealth technology discussing the above problems.     Medication Adjustments/Labs and Tests Ordered: Current medicines are reviewed at length with the patient today.  Concerns regarding medicines are outlined above.   Tests Ordered: No orders of the defined types were placed in this encounter.   Medication Changes: No orders of the defined types were placed in this encounter.   Follow Up:  In Person in 1 year(s)  Signed, Nanetta BattyJonathan Jameir Ake, MD  03/07/2019 2:36 PM    [facility]

## 2019-03-07 NOTE — Telephone Encounter (Signed)
This encounter was created in error - please disregard.

## 2019-03-07 NOTE — Telephone Encounter (Signed)
Virtual Visit Pre-Appointment Phone Call  "(Name), I am calling you today to discuss your upcoming appointment. We are currently trying to limit exposure to the virus that causes COVID-19 by seeing patients at home rather than in the office."  1. "What is the BEST phone number to call the day of the visit?" - include this in appointment notes  2. "Do you have or have access to (through a family member/friend) a smartphone with video capability that we can use for your visit?" a. If yes - list this number in appt notes as "cell" (if different from BEST phone #) and list the appointment type as a VIDEO visit in appointment notes b. If no - list the appointment type as a PHONE visit in appointment notes  3. Confirm consent - "In the setting of the current Covid19 crisis, you are scheduled for a (phone or video) visit with your provider on (date) at (time).  Just as we do with many in-office visits, in order for you to participate in this visit, we must obtain consent.  If you'Anderson like, I can send this to your mychart (if signed up) or email for you to review.  Otherwise, I can obtain your verbal consent now.  All virtual visits are billed to your insurance company just like a normal visit would be.  By agreeing to a virtual visit, we'Anderson like you to understand that the technology does not allow for your provider to perform an examination, and thus may limit your provider's ability to fully assess your condition. If your provider identifies any concerns that need to be evaluated in person, we will make arrangements to do so.  Finally, though the technology is pretty good, we cannot assure that it will always work on either your or our end, and in the setting of a video visit, we may have to convert it to a phone-only visit.  In either situation, we cannot ensure that we have a secure connection.  Are you willing to proceed?" STAFF: Did the patient verbally acknowledge consent to telehealth visit? Document  YES/NO here: 4. YES  5. Advise patient to be prepared - "Two hours prior to your appointment, go ahead and check your blood pressure, pulse, oxygen saturation, and your weight (if you have the equipment to check those) and write them all down. When your visit starts, your provider will ask you for this information. If you have an Apple Watch or Kardia device, please plan to have heart rate information ready on the day of your appointment. Please have a pen and paper handy nearby the day of the visit as well."  6. Give patient instructions for MyChart download to smartphone OR Doximity/Doxy.me as below if video visit (depending on what platform provider is using)  7. Inform patient they will receive a phone call 15 minutes prior to their appointment time (may be from unknown caller ID) so they should be prepared to answer    TELEPHONE CALL NOTE  Clayton Anderson Dominic has been deemed a candidate for a follow-up tele-health visit to limit community exposure during the Covid-19 pandemic. I spoke with the patient via phone to ensure availability of phone/video source, confirm preferred email & phone number, and discuss instructions and expectations.  I reminded Clayton Anderson Etherington to be prepared with any vital sign and/or heart rhythm information that could potentially be obtained via home monitoring, at the time of his visit. I reminded Clayton Anderson Ambroise to expect a phone call prior  to his visit.  Annita Brod, RN 03/07/2019 3:56 PM   INSTRUCTIONS FOR DOWNLOADING THE MYCHART APP TO SMARTPHONE  - The patient must first make sure to have activated MyChart and know their login information - If Apple, go to [employer] and type in MyChart in the search bar and download the app. If Android, ask patient to go to [employer] and type in [place] in the search bar and download the app. The app is free but as with any other app downloads, their phone may require them to verify saved payment information or Apple/Android  password.  - The patient will need to then log into the app with their MyChart username and password, and select [facility] as their healthcare provider to link the account. When it is time for your visit, go to the MyChart app, find appointments, and click Begin Video Visit. Be sure to Select Allow for your device to access the Microphone and Camera for your visit. You will then be connected, and your provider will be with you shortly.  **If they have any issues connecting, or need assistance please contact MyChart service desk [phone])**  **If using a computer, in order to ensure the best quality for their visit they will need to use either of the following Internet Browsers: [employer], or Google Chrome**  IF USING DOXIMITY or DOXY.ME - The patient will receive a link just prior to their visit by text.     FULL LENGTH CONSENT FOR TELE-HEALTH VISIT   I hereby voluntarily request, consent and authorize [facility] and its employed or contracted physicians, physician assistants, nurse practitioners or other licensed health care professionals (the Practitioner), to provide me with telemedicine health care services (the "Services") as deemed necessary by the treating Practitioner. I acknowledge and consent to receive the Services by the Practitioner via telemedicine. I understand that the telemedicine visit will involve communicating with the Practitioner through live audiovisual communication technology and the disclosure of certain medical information by electronic transmission. I acknowledge that I have been given the opportunity to request an in-person assessment or other available alternative prior to the telemedicine visit and am voluntarily participating in the telemedicine visit.  I understand that I have the right to withhold or withdraw my consent to the use of telemedicine in the course of my care at any time, without affecting my right to future care or treatment,  and that the Practitioner or I may terminate the telemedicine visit at any time. I understand that I have the right to inspect all information obtained and/or recorded in the course of the telemedicine visit and may receive copies of available information for a reasonable fee.  I understand that some of the potential risks of receiving the Services via telemedicine include:  Marland Kitchen Delay or interruption in medical evaluation due to technological equipment failure or disruption; . Information transmitted may not be sufficient (e.g. poor resolution of images) to allow for appropriate medical decision making by the Practitioner; and/or  . In rare instances, security protocols could fail, causing a breach of personal health information.  Furthermore, I acknowledge that it is my responsibility to provide information about my medical history, conditions and care that is complete and accurate to the best of my ability. I acknowledge that Practitioner's advice, recommendations, and/or decision may be based on factors not within their control, such as incomplete or inaccurate data provided by me or distortions of diagnostic images or specimens that may result from electronic  transmissions. I understand that the practice of medicine is not an exact science and that Practitioner makes no warranties or guarantees regarding treatment outcomes. I acknowledge that I will receive a copy of this consent concurrently upon execution via email to the email address I last provided but may also request a printed copy by calling the office of [facility].    I understand that my insurance will be billed for this visit.   I have read or had this consent read to me. . I understand the contents of this consent, which adequately explains the benefits and risks of the Services being provided via telemedicine.  . I have been provided ample opportunity to ask questions regarding this consent and the Services and have had my questions  answered to my satisfaction. . I give my informed consent for the services to be provided through the use of telemedicine in my medical care  By participating in this telemedicine visit I agree to the above.

## 2019-03-07 NOTE — Telephone Encounter (Signed)
Patient and/or DPR-approved person aware of 7/28 AVS instructions and verbalized understanding.  Letter including After Visit Summary and any other necessary documents to be mailed to the patient's address on file.  

## 2019-03-07 NOTE — Patient Instructions (Signed)
Medication Instructions:  Your physician recommends that you continue on your current medications as directed. Please refer to the Current Medication list given to you today.  A MESSAGE HAS BEEN SENT TO OUR CLINICAL PHARMACISTS REGARDING REFILLING YOUR CLOPIDOGREL (PLAVIX).  If you need a refill on your cardiac medications before your next appointment, please call your pharmacy.   Lab work: NONE If you have labs (blood work) drawn today and your tests are completely normal, you will receive your results only by: Marland Kitchen MyChart Message (if you have MyChart) OR . A paper copy in the mail If you have any lab test that is abnormal or we need to change your treatment, we will call you to review the results.  Testing/Procedures: NONE  Follow-Up: At [facility], you and your health needs are our priority.  As part of our continuing mission to provide you with exceptional heart care, we have created designated Provider Care Teams.  These Care Teams include your primary Cardiologist (physician) and Advanced Practice Providers (APPs -  Physician Assistants and Nurse Practitioners) who all work together to provide you with the care you need, when you need it. You will need a follow up appointment in 12 months WITH DR. Gwenlyn Found.  Please call our office 2 months in advance to schedule this appointment.

## 2019-03-07 NOTE — Telephone Encounter (Signed)
I called Clayton Anderson to confirm his appt for 03-07-19 with Dr Gwenlyn Found. There was no answer and no voicemail.

## 2019-03-10 MED ORDER — CLOPIDOGREL BISULFATE 75 MG PO TABS: 75.0000 mg | ORAL_TABLET | Freq: Every day | ORAL | 3 refills | Status: AC

## 2019-03-10 NOTE — Addendum Note (Signed)
Addended by: Diana Eves on: 03/10/2019 08:54 AM   Modules accepted: Orders

## 2019-03-10 NOTE — Telephone Encounter (Signed)
Rx(s) sent to pharmacy electronically.  

## 2019-03-14 ENCOUNTER — Telehealth: Payer: Self-pay

## 2019-03-14 NOTE — Telephone Encounter (Signed)
Lorretta Harp, MD  Annita Brod, RN        These are excellent numbers! Thanks   Previous Messages  ----- Message -----  From: Annita Brod, RN  Sent: 03/07/2019  5:14 PM EDT  To: Lorretta Harp, MD  Subject: LIPID PROFILE REQUESTED DURING 03/07/2019 APP*   LIPID PROFILE 12/08/2018 IN CARE EVERYWHERE   Component Name            Cholesterol, Total-----148  Triglyceride-----127  HDL (High Density Lipoprotein) Cholesterol-----47.1  LDL (Low Density Lipoprotien), Calculated [NO VALUE]  VLDL Cholesterol-----25  Cholesterol/HDL Ratio------3.1  LDL Calculated-----76         Spoke with pt and made him aware that Dr. Gwenlyn Found reviewed the results of his lipid profile in care everywhere and stated that the results were excellent. Pt verbalized understanding and questioned if record of MI's and Dr. Wynonia Lawman appt in chart. Confirmed that this information is in chart. Pt verbalized understanding. No further questions.

## 2019-09-23 ENCOUNTER — Ambulatory Visit: Payer: Medicare Other | Attending: Internal Medicine

## 2019-09-23 ENCOUNTER — Ambulatory Visit: Payer: Medicare Other

## 2019-09-23 DIAGNOSIS — Z23 Encounter for immunization: Secondary | ICD-10-CM | POA: Insufficient documentation

## 2019-09-23 NOTE — Progress Notes (Signed)
   Covid-19 Vaccination Clinic  Name:  Clayton Anderson    MRN: [id number] DOB: 15-Sep-1945  09/23/2019  Clayton Anderson was observed post Covid-19 immunization for 15 minutes without incidence. He was provided with Vaccine Information Sheet and instruction to access the V-Safe system.   Clayton Anderson was instructed to call 911 with any severe reactions post vaccine: Marland Kitchen Difficulty breathing  . Swelling of your face and throat  . A fast heartbeat  . A bad rash all over your body  . Dizziness and weakness    Immunizations Administered    Name Date Dose VIS Date Route   Pfizer COVID-19 Vaccine 09/23/2019  9:29 AM 0.3 mL 07/21/2019 Intramuscular   Manufacturer: [employer], [employer]   Lot: [id number]   NDC: [address]-5

## 2019-10-14 ENCOUNTER — Other Ambulatory Visit: Payer: Self-pay

## 2019-10-14 ENCOUNTER — Ambulatory Visit: Payer: Medicare Other | Attending: Internal Medicine

## 2019-10-14 DIAGNOSIS — Z23 Encounter for immunization: Secondary | ICD-10-CM | POA: Insufficient documentation

## 2019-10-14 NOTE — Progress Notes (Signed)
   Covid-19 Vaccination Clinic  Name:  Clayton Anderson    MRN: [id number] DOB: November 12, 1945  10/14/2019  [name] was observed post Covid-19 immunization for 15 minutes without incident. He was provided with Vaccine Information Sheet and instruction to access the V-Safe system.   [name] was instructed to call 911 with any severe reactions post vaccine: Marland Kitchen Difficulty breathing  . Swelling of face and throat  . A fast heartbeat  . A bad rash all over body  . Dizziness and weakness   Immunizations Administered    Name Date Dose VIS Date Route   Pfizer COVID-19 Vaccine 10/14/2019  9:27 AM 0.3 mL 07/21/2019 Intramuscular   Manufacturer: [employer], [employer]   Lot: [id number]   NDC: [address]-7

## 2020-07-09 ENCOUNTER — Telehealth: Payer: Self-pay | Admitting: *Deleted

## 2020-07-09 NOTE — Telephone Encounter (Signed)
Called to s/w pt in regards to needing a pre op appt. S/w pt's wife Johnny Bridge [facility]) who is agreeable to plan of care for pre op appt. Pt has been scheduled to see Corine Shelter, [facility]  07/10/20 @ 2:45. Pt's wife thanked me for the help. I will forward clearance notes to PA for upcoming appt. Will remove from the pre op call back pool.

## 2020-07-09 NOTE — Telephone Encounter (Signed)
° °  [facility] Pre-operative Risk Assessment    HEARTCARE STAFF: - Please ensure there is not already an duplicate clearance open for this procedure. - Under Visit Info/Reason for Call, type in Other and utilize the format Clearance MM/DD/YY or Clearance TBD. Do not use dashes or single digits. - If request is for dental extraction, please clarify the # of teeth to be extracted.  Request for surgical clearance:  1. What type of surgery is being performed? CERVICAL MEDIAL BRANCH BLOCK   2. When is this surgery scheduled? TBD   3. What type of clearance is required (medical clearance vs. Pharmacy clearance to hold med vs. Both)? MEDICAL  4. Are there any medications that need to be held prior to surgery and how long? PLAVIX x 7 DAYS PRIOR; REQUEST STATES PT'S PROCEDURE WILL INVOLVE A TOTAL OF 4 VISITS OVER THE NEXT 6-8 WEEKS AND WILL NEED TO HOLD ANTICOAG PRIOR TO EACH VISIT; REQUESTING CLEARANCE FOR THE FUTURE PROCEDURES    5. Practice name and name of physician performing surgery? [facility]; MD NOT LISTED   6. What is the office phone number? [phone]   7.   What is the office fax number? [phone]  8.   Anesthesia type (None, local, MAC, general) ? NOT LISTED   Julaine Hua 07/09/2020, 2:23 PM  _________________________________________________________________   (provider comments below)

## 2020-07-09 NOTE — Telephone Encounter (Signed)
   Primary Cardiologist: Nanetta Batty, MD  Chart reviewed as part of pre-operative protocol coverage. Because of Clayton Anderson past medical history and time since last visit, he will require a follow-up visit in order to better assess preoperative cardiovascular risk.  Pre-op covering staff: - Please schedule appointment and call patient to inform them. If patient already had an upcoming appointment within acceptable timeframe, please add "pre-op clearance" to the appointment notes so provider is aware. - Please contact requesting surgeon's office via preferred method (i.e, phone, fax) to inform them of need for appointment prior to surgery.  If applicable, this message will also be routed to pharmacy pool and/or primary cardiologist for input on holding anticoagulant/antiplatelet agent as requested below so that this information is available to the clearing provider at time of patient's appointment.   Nada Boozer, NP  07/09/2020, 2:57 PM

## 2020-07-10 ENCOUNTER — Other Ambulatory Visit: Payer: Self-pay

## 2020-07-10 ENCOUNTER — Encounter: Payer: Self-pay | Admitting: Cardiology

## 2020-07-10 ENCOUNTER — Ambulatory Visit (INDEPENDENT_AMBULATORY_CARE_PROVIDER_SITE_OTHER): Payer: Medicare Other | Admitting: Cardiology

## 2020-07-10 VITALS — BP 142/60 | HR 65 | Ht 69.0 in | Wt 199.6 lb

## 2020-07-10 DIAGNOSIS — I10 Essential (primary) hypertension: Secondary | ICD-10-CM | POA: Diagnosis not present

## 2020-07-10 DIAGNOSIS — Z9861 Coronary angioplasty status: Secondary | ICD-10-CM | POA: Diagnosis not present

## 2020-07-10 DIAGNOSIS — Z01818 Encounter for other preprocedural examination: Secondary | ICD-10-CM | POA: Insufficient documentation

## 2020-07-10 DIAGNOSIS — I2510 Atherosclerotic heart disease of native coronary artery without angina pectoris: Secondary | ICD-10-CM

## 2020-07-10 DIAGNOSIS — M5090 Cervical disc disorder, unspecified, unspecified cervical region: Secondary | ICD-10-CM

## 2020-07-10 DIAGNOSIS — E782 Mixed hyperlipidemia: Secondary | ICD-10-CM | POA: Diagnosis not present

## 2020-07-10 NOTE — Progress Notes (Signed)
Cardiology Office Note:    Date:  07/10/2020   ID:  Clayton HugerCarl D Sowder, DOB 04/25/1946, MRN [id number]  PCP:  Kandyce RudBabaoff, Marcus, MD  Cardiologist:  Nanetta BattyJonathan Berry, MD  Electrophysiologist:  None   Referring MD: Kandyce RudBabaoff, Marcus, MD   CC: pre op clearance prior to cervical block  History of Present Illness:    Clayton Anderson is a 74 y.o. male with a hx of coronary disease, previously followed by Dr. Donnie Ahoilley.  The patient had an LAD and circumflex PCI in 2001.  In November 2018 he presented with an acute MI.  At catheterization he had LAD in-stent restenosis.  He underwent PCI to the LAD with an overlapping DES.  The patient did have a residual 30% left main and 75% mid circumflex narrowing.  In the holding area he had acute closure beyond the LAD stent from edge dissection. This was successfully treated with PTCA and DES.  He has done well since from that standpoint.  He is in the office today for pre op clearance prior to cervical nerve block. He denies any chest pain or NTG use. He is able to walk around the block without issue.  Past Medical History:  Diagnosis Date  . Anxiety   . Arthritis   . Baker's cyst of knee    left  . Cervical disc disease 07/20/2011  . Complication of anesthesia    " I wake up in a combative mode"  . Coronary artery disease   . Depression   . Elevated cholesterol   . GERD (gastroesophageal reflux disease)   . Headache(784.0)    years ago  . History of hiatal hernia   . Hypertension   . Myocardial infarct [facility])    2001  sees Dr. York SpanielS Tilley  . Neuromuscular disorder (HCC)    hx of shingles/ myofacitis  . Shortness of breath     Past Surgical History:  Procedure Laterality Date  . COLONOSCOPY    . CORONARY ANGIOGRAPHY N/A 07/02/2017   Procedure: CORONARY ANGIOGRAPHY;  Surgeon: Kathleene HazelMcAlhany, Christopher D, MD;  Location: [facility] INV[facility]IVE CV LAB;  Service: Cardiovascular;  Laterality: N/A;  . CORONARY ANGIOPLASTY     x 2  last one 07/2011  . CORONARY STENT INTERVENTION  N/A 07/02/2017   Procedure: CORONARY STENT INTERVENTION;  Surgeon: Tonny Bollmanooper, Michael, MD;  [facility];  Service: Cardiovascular;  Laterality: N/A;  . CORONARY STENT PLACEMENT  2001   Tetra Multi Link Stent-3T MRI compatible, normal mode  . CORONARY/GRAFT ACUTE MI REVASCULARIZATION N/A 07/02/2017   Procedure: Coronary/Graft Acute MI Revascularization;  Surgeon: Kathleene HazelMcAlhany, Christopher D, MD;  Location: [facility] CV [facility];  Service: Cardiovascular;  Laterality: N/A;  . LAMINECTOMY AND MICRODISCECTOMY CERVICAL SPINE     January and August 2010  . LEFT HEART CATH AND CORONARY ANGIOGRAPHY N/A 07/02/2017   Procedure: LEFT HEART CATH AND CORONARY ANGIOGRAPHY;  Surgeon: Tonny Bollmanooper, Michael, MD;  [facility];  Service: Cardiovascular;  Laterality: N/A;  . LEFT HEART CATHETERIZATION WITH CORONARY ANGIOGRAM N/A 07/20/2011   Procedure: LEFT HEART CATHETERIZATION WITH CORONARY ANGIOGRAM;  Surgeon: Othella BoyerWilliam S Tilley, MD;  [facility];  Service: Cardiovascular;  Laterality: N/A;  . SHOULDER ARTHROSCOPY WITH LABRAL REPAIR Left 12/01/2012   Procedure: LEFT SHOULDER ARTHROSCOPY WITH ASPIRATION OF DEGENERATIVE OF THE PARALABRAL CYST and OPEN DECOMPRESSION OF SUPRASCAPULAR NERVE;  Surgeon: Senaida LangeKevin M Supple, MD;  Location: [facility] OR;  Service: Orthopedics;  Laterality: Left;  . SHOULDER SURGERY  twice  left shoulder    Current Medications: Current Meds  Medication Sig  . amLODipine (NORVASC) 10 MG tablet Take 1 tablet (10 mg total) by mouth daily.  Marland Kitchen aspirin EC 81 MG tablet Take 81 mg by mouth daily.    Marland Kitchen buPROPion (WELLBUTRIN XL) 150 MG 24 hr tablet Take 150 mg by mouth daily.   . carvedilol (COREG) 3.125 MG tablet Take 1 tablet (3.125 mg total) by mouth 2 (two) times daily with a meal.  . clopidogrel (PLAVIX) 75 MG tablet Take 1 tablet (75 mg total) by mouth daily.  . DULoxetine (CYMBALTA) 60 MG capsule Take 120 mg by mouth daily.   Marland Kitchen escitalopram (LEXAPRO) 10 MG tablet Take  10 mg by mouth daily. Reported on 02/03/2016  . gabapentin (NEURONTIN) 300 MG capsule Take 900 mg by mouth 3 (three) times daily.   . nitroGLYCERIN (NITROSTAT) 0.4 MG SL tablet Place 0.4 mg under the tongue every 5 (five) minutes as needed.    . Oxycodone HCl 20 MG TABS Take 40-60 mg by mouth every 4 (four) hours as needed (pain).  . ramipril (ALTACE) 10 MG capsule Take 10 mg by mouth daily.    . rosuvastatin (CRESTOR) 40 MG tablet Take 1 tablet (40 mg total) by mouth daily at 6 PM.  . tiZANidine (ZANAFLEX) 2 MG tablet Take 2-4 mg by mouth 4 (four) times daily as needed for muscle spasms.     Allergies:   Sulfa antibiotics and Tetanus toxoids   Social History   Socioeconomic History  . Marital status: Married    Spouse name: Dmitri Pettigrew  . Number of children: Y  . Years of education: Not on file  . Highest education level: Not on file  Occupational History  . Occupation: retired Curator  Tobacco Use  . Smoking status: Former Smoker    Packs/day: 0.90    Years: 40.00    Pack years: 36.00    Types: Cigarettes    Quit date: 07/19/2004    Years since quitting: 15.9  . Smokeless tobacco: Never Used  Substance and Sexual Activity  . Alcohol use: Yes    Alcohol/week: 0.0 standard drinks    Comment: occasionally  . Drug use: No  . Sexual activity: Never  Other Topics Concern  . Not on file  Social History Narrative  . Not on file   Social Determinants of Health   Financial Resource Strain:   . Difficulty of Paying Living Expenses: Not on file  Food Insecurity:   . Worried About Programme researcher, broadcasting/film/video in the Last Year: Not on file  . Ran Out of Food in the Last Year: Not on file  Transportation Needs:   . Lack of Transportation (Medical): Not on file  . Lack of Transportation (Non-Medical): Not on file  Physical Activity:   . Days of Exercise per Week: Not on file  . Minutes of Exercise per Session: Not on file  Stress:   . Feeling of Stress : Not on file  Social  Connections:   . Frequency of Communication with Friends and Family: Not on file  . Frequency of Social Gatherings with Friends and Family: Not on file  . Attends Religious Services: Not on file  . Active Member of Clubs or Organizations: Not on file  . Attends Banker Meetings: Not on file  . Marital Status: Not on file     Family History: The patient's family history includes Alcohol abuse in his father; Birth  defects in his brother; Diabetes in his maternal grandmother; Hyperlipidemia in his father.  ROS:   Please see the history of present illness.     All other systems reviewed and are negative.  EKGs/Labs/Other Studies Reviewed:    The following studies were reviewed today: Cath/PCI 07/02/2020  EKG:  EKG is ordered today.  The ekg ordered today demonstrates NSR, HR 65  Recent Labs: No results found for requested labs within last 8760 hours.  Recent Lipid Panel    Component Value Date/Time   CHOL 219 (H) 07/03/2017 0154   TRIG 272 (H) 07/03/2017 0154   HDL 34 (L) 07/03/2017 0154   CHOLHDL 6.4 07/03/2017 0154   VLDL 54 (H) 07/03/2017 0154   LDLCALC 131 (H) 07/03/2017 0154    Physical Exam:    VS:  BP (!) 142/60   Pulse 65   Ht 5\' 9"  (1.753 m)   Wt 199 lb 9.6 oz (90.5 kg)   BMI 29.48 kg/m     Wt Readings from Last 3 Encounters:  07/10/20 199 lb 9.6 oz (90.5 kg)  09/07/18 196 lb 6.4 oz (89.1 kg)  07/11/18 190 lb (86.2 kg)     GEN: Well nourished, well developed in no acute distress HEENT: Normal NECK: No JVD; short RCA bruit CARDIAC: RRR, no murmurs, rubs, gallops RESPIRATORY:  Clear to auscultation without rales, wheezing or rhonchi  ABDOMEN: Soft, non-tender, non-distended MUSCULOSKELETAL:  No edema; No deformity  SKIN: Warm and dry NEUROLOGIC:  Alert and oriented x 3 PSYCHIATRIC:  Normal affect   ASSESSMENT:    Pre-operative clearance Seen today for pre op clearance.  He will need to hold Plavix prior.  CAD S/P percutaneous coronary  angioplasty Stent to LAD  And PCI of OM1 2001 Cath 07/02/17 30% left main, 99% instent restenosis of LAD, 75% circ, mild RCA-s/p LAD 3.0 x 8 mm Resolte stent and PTCA diagonal,  Later that day acute closure with 2.5 x 18 resolute stent for edge dissection.  Hypertension Controlled  Hyperlipidemia On statin Rx- LDL 72 06/11/2020  Cervical disc disease Pt needs cervical branch block  PLAN:    The patient is an acceptable risk from a cardiac standpoint for the proposed procedure.  He can hold his Plavix for 5 to 7 days preop if needed.  We would prefer the patient remain on aspirin 81 mg a day if possible, if not this may be held 3 to 5 days preop.  I will forward official clearance via epic fax.   Medication Adjustments/Labs and Tests Ordered: Current medicines are reviewed at length with the patient today.  Concerns regarding medicines are outlined above.  Orders Placed This Encounter  Procedures  . EKG 12-Lead   No orders of the defined types were placed in this encounter.   Patient Instructions  Medication Instructions:  Continue current medications  *If you need a refill on your cardiac medications before your next appointment, please call your pharmacy*   Lab Work: None Ordered   Testing/Procedures: None Ordered   Follow-Up: At 13/09/2019, you and your health needs are our priority.  As part of our continuing mission to provide you with exceptional heart care, we have created designated Provider Care Teams.  These Care Teams include your primary Cardiologist (physician) and Advanced Practice Providers (APPs -  Physician Assistants and Nurse Practitioners) who all work together to provide you with the care you need, when you need it.  We recommend signing up for the patient portal called "MyChart".  Sign up information is provided on this After Visit Summary.  MyChart is used to connect with patients for Virtual Visits (Telemedicine).  Patients are able to view  lab/test results, encounter notes, upcoming appointments, etc.  Non-urgent messages can be sent to your provider as well.   To learn more about what you can do with MyChart, go to [email or web address].    Your next appointment:   3 month(s)  The format for your next appointment:   In Person  Provider:   You may see Nanetta Batty, MD  or one of the following Advanced Practice Providers on your designated Care Team:    Corine Shelter, PA-C  [place], [place]  Edd Fabian, FNP      Signed, Corine Shelter, [place]  07/10/2020 3:01 PM    [facility]

## 2020-07-10 NOTE — Telephone Encounter (Signed)
   Primary Cardiologist: Nanetta Batty, MD  Chart reviewed and patient seen today in the office as part of pre-operative protocol coverage. Given past medical history and time since last visit, based on ACC/AHA guidelines, Clayton Anderson would be at acceptable risk for the planned procedure without further cardiovascular testing.   Ok to hold Plavix 5-7 days pre op if needed (for each procedure).  While the patient is off Plavix we would prefer he remain on ASA 81 mg if possible but will defer to the operating surgeon.   The patient was advised that if he develops new symptoms prior to surgery to contact our office to arrange for a follow-up visit, and he verbalized understanding.  I will route this recommendation to the requesting party via Epic fax function and remove from pre-op pool.  Please call with questions.  Corine Shelter, PA-C 07/10/2020, 3:03 PM

## 2020-07-10 NOTE — Assessment & Plan Note (Signed)
Stent to LAD  And PCI of OM1 2001 Cath 07/02/17 30% left main, 99% instent restenosis of LAD, 75% circ, mild RCA-s/p LAD 3.0 x 8 mm Resolte stent and PTCA diagonal,  Later that day acute closure with 2.5 x 18 resolute stent for edge dissection.

## 2020-07-10 NOTE — Assessment & Plan Note (Signed)
Controlled.  

## 2020-07-10 NOTE — Assessment & Plan Note (Signed)
Seen today for pre op clearance.  He will need to hold Plavix prior.

## 2020-07-10 NOTE — Assessment & Plan Note (Signed)
Pt needs cervical branch block

## 2020-07-10 NOTE — Patient Instructions (Signed)

## 2020-07-10 NOTE — Assessment & Plan Note (Signed)
On statin Rx- LDL 72 06/11/2020

## 2020-10-11 ENCOUNTER — Other Ambulatory Visit: Payer: Self-pay

## 2020-10-11 ENCOUNTER — Encounter: Payer: Self-pay | Admitting: Cardiovascular Disease

## 2020-10-11 ENCOUNTER — Ambulatory Visit (INDEPENDENT_AMBULATORY_CARE_PROVIDER_SITE_OTHER): Payer: Medicare Other | Admitting: Cardiovascular Disease

## 2020-10-11 DIAGNOSIS — Z9861 Coronary angioplasty status: Secondary | ICD-10-CM | POA: Diagnosis not present

## 2020-10-11 DIAGNOSIS — I10 Essential (primary) hypertension: Secondary | ICD-10-CM

## 2020-10-11 DIAGNOSIS — E782 Mixed hyperlipidemia: Secondary | ICD-10-CM

## 2020-10-11 DIAGNOSIS — I2510 Atherosclerotic heart disease of native coronary artery without angina pectoris: Secondary | ICD-10-CM

## 2020-10-11 NOTE — Assessment & Plan Note (Signed)
History of hyperlipidemia on statin therapy with lipid profile performed by his PCP 06/11/2020 revealing total cholesterol of 144, LDL 72 and HDL 50.

## 2020-10-11 NOTE — Assessment & Plan Note (Signed)
History of CAD status post stent to the LAD and PCI of OM1 in 2001.  He was cath 07/02/2017 by Dr. Excell Seltzer with stenting of his proximal LAD using overlapping drug-eluting stents.  He unfortunately had acute stent thrombosis in the holding area and was brought back to the Cath Lab by Dr. Clifton James requiring reintervention.  He has asymptomatic on dual antiplatelet therapy.

## 2020-10-11 NOTE — Patient Instructions (Signed)

## 2020-10-11 NOTE — Progress Notes (Signed)
10/11/2020 Clayton Anderson   [birth year]  [id number]  Primary Physician Kandyce Rud, MD Primary Cardiologist: Runell Gess MD Roseanne Reno  HPI:  Clayton Anderson is a 75 y.o.  mildly overweight married Caucasian male father of 1 child who is retired from [employer]. He was referred for preoperative clearance before deep brain stimulation for Arther Dames I last spoke to him on the phone for a virtual telemedicine phone visit 03/07/2019. He is previously a patient of Dr. Donnie Aho and has seen Dr. Tomie China in the past as well. His risk factors include treated hypertension hyperlipidemia. He has a history of remote tobacco abuse. He had LAD and circumflex stenting back in 2001 as well as a recent procedure by Dr. Excell Seltzer 07/02/2017 with stenting of his proximal LAD using overlapping DES stents. He unfortunately had acute stent thrombosis in the holding area and was brought back to the Cath Lab by Dr. Clifton James requiring re-intervention. His EF is in the 45% range by 2D echo performed 07/03/2017.  Since I spoke to him on the phone a year and a half ago he did see Corine Shelter in the office for preoperative clearance for a cervical block 07/10/2020 he apparently also had a stroke while in [facility] in July of last year which she has recovered from.  He denies chest pain or shortness of breath.   Current Meds  Medication Sig  . amLODipine (NORVASC) 10 MG tablet Take 1 tablet (10 mg total) by mouth daily.  Marland Kitchen aspirin EC 81 MG tablet Take 81 mg by mouth daily.  Marland Kitchen buPROPion (WELLBUTRIN XL) 150 MG 24 hr tablet Take 150 mg by mouth daily.   . carvedilol (COREG) 3.125 MG tablet Take 1 tablet (3.125 mg total) by mouth 2 (two) times daily with a meal.  . clopidogrel (PLAVIX) 75 MG tablet Take 1 tablet (75 mg total) by mouth daily.  . DULoxetine (CYMBALTA) 60 MG capsule Take 120 mg by mouth daily.   Marland Kitchen escitalopram (LEXAPRO) 10 MG tablet Take 10 mg by mouth daily. Reported on 02/03/2016  . gabapentin  (NEURONTIN) 300 MG capsule Take 900 mg by mouth 3 (three) times daily.   . nitroGLYCERIN (NITROSTAT) 0.4 MG SL tablet Place 0.4 mg under the tongue every 5 (five) minutes as needed.  . Oxycodone HCl 20 MG TABS Take 40-60 mg by mouth every 4 (four) hours as needed (pain).  . ramipril (ALTACE) 10 MG capsule Take 10 mg by mouth daily.  . rosuvastatin (CRESTOR) 40 MG tablet Take 1 tablet (40 mg total) by mouth daily at 6 PM.  . tiZANidine (ZANAFLEX) 2 MG tablet Take 2-4 mg by mouth 4 (four) times daily as needed for muscle spasms.     Allergies  Allergen Reactions  . Sulfa Antibiotics Hives and Other (See Comments)  . Tetanus Toxoids Swelling and Other (See Comments)    Social History   Socioeconomic History  . Marital status: Married    Spouse name: Bartlett Enke  . Number of children: Y  . Years of education: Not on file  . Highest education level: Not on file  Occupational History  . Occupation: retired Curator  Tobacco Use  . Smoking status: Former Smoker    Packs/day: 0.90    Years: 40.00    Pack years: 36.00    Types: Cigarettes    Quit date: 07/19/2004    Years since quitting: 16.2  . Smokeless tobacco: Never Used  Substance and Sexual Activity  .  Alcohol use: Yes    Alcohol/week: 0.0 standard drinks    Comment: occasionally  . Drug use: No  . Sexual activity: Never  Other Topics Concern  . Not on file  Social History Narrative  . Not on file   Social Determinants of Health   Financial Resource Strain: Not on file  Food Insecurity: Not on file  Transportation Needs: Not on file  Physical Activity: Not on file  Stress: Not on file  Social Connections: Not on file  Intimate Partner Violence: Not on file     Review of Systems: General: negative for chills, fever, night sweats or weight changes.  Cardiovascular: negative for chest pain, dyspnea on exertion, edema, orthopnea, palpitations, paroxysmal nocturnal dyspnea or shortness of breath Dermatological:  negative for rash Respiratory: negative for cough or wheezing Urologic: negative for hematuria Abdominal: negative for nausea, vomiting, diarrhea, bright red blood per rectum, melena, or hematemesis Neurologic: negative for visual changes, syncope, or dizziness All other systems reviewed and are otherwise negative except as noted above.    Blood pressure (!) 102/56, pulse 64, height 5' 9.5" (1.765 m), weight 196 lb 12.8 oz (89.3 kg), SpO2 97 %.  General appearance: alert and no distress Neck: no adenopathy, no carotid bruit, no JVD, supple, symmetrical, trachea midline and thyroid not enlarged, symmetric, no tenderness/mass/nodules Lungs: clear to auscultation bilaterally Heart: regular rate and rhythm, S1, S2 normal, no murmur, click, rub or gallop Extremities: extremities normal, atraumatic, no cyanosis or edema Pulses: 2+ and symmetric Skin: Skin color, texture, turgor normal. No rashes or lesions Neurologic: Alert and oriented X 3, normal strength and tone. Normal symmetric reflexes. Normal coordination and gait  EKG sinus rhythm at 63 without ST or T wave changes.  I personally reviewed this EKG.  ASSESSMENT AND PLAN:   Hypertension History of essential hypertension blood pressure measured today at 102/56.  He is on carvedilol and ramipril.  Hyperlipidemia History of hyperlipidemia on statin therapy with lipid profile performed by his PCP 06/11/2020 revealing total cholesterol of 144, LDL 72 and HDL 50.  CAD S/P percutaneous coronary angioplasty History of CAD status post stent to the LAD and PCI of OM1 in 2001.  He was cath 07/02/2017 by Dr. Excell Seltzer with stenting of his proximal LAD using overlapping drug-eluting stents.  He unfortunately had acute stent thrombosis in the holding area and was brought back to the Cath Lab by Dr. Clifton James requiring reintervention.  He has asymptomatic on dual antiplatelet therapy.      Runell Gess MD FACP,FACC,FAHA, [facility] 10/11/2020 2:13  PM

## 2020-10-11 NOTE — Assessment & Plan Note (Signed)
History of essential hypertension blood pressure measured today at 102/56.  He is on carvedilol and ramipril.

## 2021-02-03 ENCOUNTER — Telehealth: Payer: Self-pay | Admitting: Cardiovascular Disease

## 2021-02-03 MED ORDER — NITROGLYCERIN 0.4 MG SL SUBL: 0.4000 mg | SUBLINGUAL_TABLET | SUBLINGUAL | 1 refills | Status: DC | PRN

## 2021-02-03 NOTE — Telephone Encounter (Signed)
*  STAT* If patient is at the pharmacy, call can be transferred to refill team.   1. Which medications need to be refilled? (please list name of each medication and dose if known) Nitrostat  2. Which pharmacy/location (including street and city if local pharmacy) is medication to be sent to? [facility]  3. Do they need a 30 day or 90 day supply?  90 days

## 2021-02-03 NOTE — Telephone Encounter (Signed)
Nitrostat sent to desired pharmacy

## 2021-10-07 ENCOUNTER — Ambulatory Visit: Payer: Medicare Other | Admitting: Cardiovascular Disease

## 2021-10-17 ENCOUNTER — Telehealth: Payer: Self-pay

## 2021-10-17 NOTE — Telephone Encounter (Signed)
? ?  Pre-operative Risk Assessment  ?  ?Patient Name: Clayton Anderson  ?DOB: May 14, 1946 ?MRN: [id number]  ? ?  ? ?Request for Surgical Clearance   ? ?Procedure:   Cataract surgery ? ?Date of Surgery:  Clearance TBD                              ?   ?Surgeon:  Dr.Christine McQuen ?Surgeon's Group or Practice Name: [facility]  ?Phone number: [phone] ?Fax number:  [phone] ?  ?Type of Clearance Requested:   ?- Medical  ?- Pharmacy:  Hold Aspirin and Clopidogrel (Plavix)   ?  ?Type of Anesthesia:  General  ?  ?Additional requests/questions:  Please advise surgeon/provider what medications should be held. ?Please fax a copy of clearance to the surgeon's office. ? ?Signed, ?Thedore Mins Elijahjames Fuelling   ?10/17/2021, 3:21 PM  ? ?

## 2021-10-20 NOTE — Telephone Encounter (Signed)
? ?  Patient Name: Clayton Anderson  ?DOB: [birth year] ?MRN: [id number] ? ?Primary Cardiologist: Nanetta Batty, MD ? ?Chart reviewed as part of pre-operative protocol coverage. Cataract extractions are recognized in guidelines as low risk surgeries that do not typically require specific preoperative testing or holding of blood thinner therapy. Therefore, given past medical history and time since last visit, based on ACC/AHA guidelines, Clayton Anderson would be at acceptable risk for the planned procedure without further cardiovascular testing.  ? ?I will route this recommendation to the requesting party via Epic fax function and remove from pre-op pool. ? ?Please call with questions. ? ?Marcelino Duster, PA ?10/20/2021, 2:16 PM ? ?

## 2021-10-24 NOTE — Telephone Encounter (Signed)
CLEARANCE HAS BEEN FAXED TO CORRECT FAX # THAT WAS GIVEN TO OUR OFFICE TODAY [phone] ?

## 2021-10-24 NOTE — Telephone Encounter (Signed)
? ?  [facility] called, she said, they did not receive fax clearance. She gave correct fax# [phone] ?

## 2021-10-28 ENCOUNTER — Telehealth: Payer: Self-pay | Admitting: Cardiovascular Disease

## 2021-10-28 NOTE — Telephone Encounter (Signed)
I s/w the pt and he is agreeable to sooner appt for pre op clearance. Pt has been scheduled to see Diona Browner, NP 10/29/21 @ 10:15 at the [facility] office. Pt thanked me for the call and the help. I will update Dr. Ellie Lunch as well that the pt will need a office visit due to procedure using general anesthesia.  ?

## 2021-10-28 NOTE — Telephone Encounter (Signed)
Per pre op provider Micah Flesher, PAC as the clearance was intended for clearance for general anesthesia, the pt will now require an in office appt.  ?

## 2021-10-28 NOTE — Telephone Encounter (Signed)
I called and s/w Dr. Charlotte Sanes who had question about the clearance we faxed back to her office. Dr. Charlotte Sanes states she is not needing to hold any blood thinners. She was sending the clearance in regard to the pt will be put under general anesthesia, which for this procedure per Dr. Charlotte Sanes pt's are not placed under general anesthesia. MD states that her anesthesiologist needs clearance ok to proceed using general anesthesia. I assure Dr. Charlotte Sanes that I will have pre op provider today Micah Flesher, [facility] to review and be sure verbiage states ok to proceed with general anesthesia. I assured MD once I have the new clearance note we will be sure to fax over to their office. Dr. Charlotte Sanes thanked me for the help.    ?

## 2021-10-28 NOTE — Telephone Encounter (Signed)
I called and s/w Dr. McCuen who had question about the clearance we faxed back to her office. Dr. McCuen states she is not needing to hold any blood thinners. She was sending the clearance in regard to the pt will be put under general anesthesia, which for this procedure per Dr. McCuen pt's are not placed under general anesthesia. MD states that her anesthesiologist needs clearance ok to proceed using general anesthesia. I assure Dr. McCuen that I will have pre op provider today Angela Duke, PAC to review and be sure verbiage states ok to proceed with general anesthesia. I assured MD once I have the new clearance note we will be sure to fax over to their office. Dr. McCuen thanked me for the help.    ?

## 2021-10-28 NOTE — Telephone Encounter (Signed)
[facility] is reaching out with questions in regards to clearance... please advise  ?

## 2021-10-28 NOTE — Telephone Encounter (Signed)
? ?  Name: Clayton Anderson  ?DOB: 11/02/1945  ?MRN: [id number] ? ?Primary Cardiologist: Nanetta Batty, MD ? ?Chart reviewed as part of pre-operative protocol coverage. Because of ESPN ZEMAN past medical history and time since last visit, he will require a follow-up visit in order to better assess preoperative cardiovascular risk. ? ?Will have general anesthesia for eye surgery. ? ? ?Pre-op covering staff: ?- Please schedule appointment and call patient to inform them. If patient already had an upcoming appointment within acceptable timeframe, please add "pre-op clearance" to the appointment notes so provider is aware. ?- Please contact requesting surgeon's office via preferred method (i.e, phone, fax) to inform them of need for appointment prior to surgery. ? ?If applicable, this message will also be routed to pharmacy pool and/or primary cardiologist for input on holding anticoagulant/antiplatelet agent as requested below so that this information is available to the clearing provider at time of patient's appointment.  ? ?Marcelino Duster, PA  ?10/28/2021, 10:21 AM  ? ?

## 2021-10-28 NOTE — Progress Notes (Signed)
? ? ?Office Visit  ?  ?Patient Name: Clayton Anderson ?Date of Encounter: 10/29/2021 ? ?Primary Care Provider:  Derinda Late, MD ?Primary Cardiologist:  Quay Burow, MD ? ?Chief Complaint  ?  ?76 year old male with a history of CAD, hypertension, hyperlipidemia, anxiety, depression, and arthritis who presents for preoperative cardiac evaluation and follow-up related to CAD. ? ?Past Medical History  ?  ?Past Medical History:  ?Diagnosis Date  ? Anxiety   ? Arthritis   ? Baker's cyst of knee   ? left  ? Cervical disc disease 07/20/2011  ? Complication of anesthesia   ? " I wake up in a combative mode"  ? Coronary artery disease   ? Depression   ? Elevated cholesterol   ? GERD (gastroesophageal reflux disease)   ? Headache(784.0)   ? years ago  ? History of hiatal hernia   ? Hypertension   ? Myocardial infarct [facility])   ? 2001  sees Dr. Thurman Coyer  ? Neuromuscular disorder ([place])   ? hx of shingles/ myofacitis  ? Shortness of breath   ? ?Past Surgical History:  ?Procedure Laterality Date  ? COLONOSCOPY    ? CORONARY ANGIOGRAPHY N/A 07/02/2017  ? Procedure: CORONARY ANGIOGRAPHY;  Surgeon: Burnell Blanks, MD;  Location: [place] CV [facility];  Service: Cardiovascular;  Laterality: N/A;  ? CORONARY ANGIOPLASTY    ? x 2  last one 07/2011  ? CORONARY STENT INTERVENTION N/A 07/02/2017  ? Procedure: CORONARY STENT INTERVENTION;  Surgeon: Sherren Mocha, MD;  Location: [place] CV [facility];  Service: Cardiovascular;  Laterality: N/A;  ? CORONARY STENT PLACEMENT  2001  ? Tetra Multi Link Stent-3T MRI compatible, normal mode  ? CORONARY/GRAFT ACUTE MI REVASCULARIZATION N/A 07/02/2017  ? Procedure: Coronary/Graft Acute MI Revascularization;  Surgeon: Burnell Blanks, MD;  Location: [place] [facility];  Service: Cardiovascular;  Laterality: N/A;  ? LAMINECTOMY AND MICRODISCECTOMY CERVICAL SPINE    ? January and August 2010  ? LEFT HEART CATH AND CORONARY ANGIOGRAPHY N/A 07/02/2017  ? Procedure: LEFT HEART CATH AND  CORONARY ANGIOGRAPHY;  Surgeon: Sherren Mocha, MD;  Location: [place] CV [facility];  Service: Cardiovascular;  Laterality: N/A;  ? LEFT HEART CATHETERIZATION WITH CORONARY ANGIOGRAM N/A 07/20/2011  ? Procedure: LEFT HEART CATHETERIZATION WITH CORONARY ANGIOGRAM;  Surgeon: Jacolyn Reedy, MD;  Location: [facility] CATH LAB;  Service: Cardiovascular;  Laterality: N/A;  ? SHOULDER ARTHROSCOPY WITH LABRAL REPAIR Left 12/01/2012  ? Procedure: LEFT SHOULDER ARTHROSCOPY WITH ASPIRATION OF DEGENERATIVE OF THE PARALABRAL CYST and OPEN DECOMPRESSION OF SUPRASCAPULAR NERVE;  Surgeon: Marin Shutter, MD;  Location: [place];  Service: Orthopedics;  Laterality: Left;  ? SHOULDER SURGERY    ? twice  left shoulder  ? ? ?Allergies ? ?Allergies  ?Allergen Reactions  ? Sulfa Antibiotics Hives and Other (See Comments)  ? Tetanus Toxoids Swelling and Other (See Comments)  ? ? ?History of Present Illness  ?  ?76 year old male with the above past medical history including CAD, hypertension, hyperlipidemia, anxiety, depression, and arthritis.  ? ?Previously followed by Dr. Wynonia Lawman as well as Dr. Geraldo Pitter.  He had stenting to the LAD and LCx in 2001.  Cardiac catheterization in 2018 showed patent stents to dLM-pLAD, p-mLAD 100% s/p DES x 2 overlapping. Unfortunately, he had acute in-stent thrombosis in the holding area and was brought back to the Cath Lab and required re- intervention.  Echocardiogram in 2018 showed EF 45 to 50%, hypokinesis of the anteroseptal and apical myocardium, mild  mitral valve regurgitation. He was last seen in the office on 10/11/2020 and was stable from a cardiac standpoint.  He denied symptoms concerning for angina.  ? ?He presents today for follow-up and for preoperative cardiac evaluation for upcoming cataract surgery at the request of Dr. Sheppard Coil with [facility] ophthalmology.  Since his last visit he has done well from a cardiac standpoint.  He denies any symptoms concerning for angina.  Denies dyspnea, edema,  PND, orthopnea.  He does report occasional mild dizziness when standing, denies palpitations, presyncope, syncope.  Overall, he reports feeling well denies any specific concerns or complaints today. ? ?Home Medications  ?  ?Current Outpatient Medications  ?Medication Sig Dispense Refill  ? amLODipine (NORVASC) 10 MG tablet Take 1 tablet (10 mg total) by mouth daily. 30 tablet 12  ? aspirin EC 81 MG tablet Take 81 mg by mouth daily.    ? buPROPion (WELLBUTRIN XL) 150 MG 24 hr tablet Take 150 mg by mouth daily.   1  ? carvedilol (COREG) 3.125 MG tablet Take 1 tablet (3.125 mg total) by mouth 2 (two) times daily with a meal. 180 tablet 1  ? clopidogrel (PLAVIX) 75 MG tablet Take 1 tablet (75 mg total) by mouth daily. 90 tablet 3  ? DULoxetine (CYMBALTA) 60 MG capsule Take 120 mg by mouth daily.     ? escitalopram (LEXAPRO) 10 MG tablet Take 10 mg by mouth daily. Reported on 02/03/2016    ? gabapentin (NEURONTIN) 300 MG capsule Take 900 mg by mouth 3 (three) times daily.     ? nitroGLYCERIN (NITROSTAT) 0.4 MG SL tablet Place 1 tablet (0.4 mg total) under the tongue every 5 (five) minutes as needed. 45 tablet 1  ? omeprazole (PRILOSEC) 40 MG capsule Take by mouth.    ? Oxycodone HCl 20 MG TABS Take 40-60 mg by mouth every 4 (four) hours as needed (pain).    ? ramipril (ALTACE) 10 MG capsule Take 10 mg by mouth daily.    ? rosuvastatin (CRESTOR) 40 MG tablet Take 1 tablet (40 mg total) by mouth daily at 6 PM. 90 tablet 1  ? tiZANidine (ZANAFLEX) 2 MG tablet Take 2-4 mg by mouth 4 (four) times daily as needed for muscle spasms.  0  ? ?No current facility-administered medications for this visit.  ?  ? ?Review of Systems  ?  ?He denies chest pain, palpitations, dyspnea, pnd, orthopnea, n, v, dizziness, syncope, edema, weight gain, or early satiety. All other systems reviewed and are otherwise negative except as noted above.  ? ?Physical Exam  ?  ?VS:  BP 128/70 (BP Location: Left Arm, Patient Position: Sitting, Cuff Size:  Normal)   Pulse 66   Resp 20   Ht 5\' 9"  (1.753 m)   Wt 202 lb (91.6 kg)   SpO2 98%   BMI 29.83 kg/m?   ?GEN: Well nourished, well developed, in no acute distress. ?HEENT: normal. ?Neck: Supple, no JVD, carotid bruits, or masses. ?Cardiac: RRR, no murmurs, rubs, or gallops. No clubbing, cyanosis, edema.  Radials/DP/PT 2+ and equal bilaterally.  ?Respiratory:  Respirations regular and unlabored, clear to auscultation bilaterally. ?GI: Soft, nontender, nondistended, BS + x 4. ?MS: no deformity or atrophy. ?Skin: warm and dry, no rash. ?Neuro:  Strength and sensation are intact. ?Psych: Normal affect. ? ?Accessory Clinical Findings  ?  ?ECG personally reviewed by me today -NSR, 66 bpm- no acute changes. ? ?Lab Results  ?Component Value Date  ? WBC 7.1 05/03/2018  ?  HGB 13.5 05/03/2018  ? HCT 38.7 (L) 05/03/2018  ? MCV 85.5 05/03/2018  ? PLT 292 05/03/2018  ? ?Lab Results  ?Component Value Date  ? CREATININE 1.78 (H) 05/03/2018  ? BUN 31 (H) 05/03/2018  ? NA 132 (L) 05/03/2018  ? K 5.4 (H) 05/03/2018  ? CL 101 05/03/2018  ? CO2 23 05/03/2018  ? ?Lab Results  ?Component Value Date  ? ALT 14 05/03/2018  ? AST 22 05/03/2018  ? ALKPHOS 64 05/03/2018  ? BILITOT 0.7 05/03/2018  ? ?Lab Results  ?Component Value Date  ? CHOL 219 (H) 07/03/2017  ? HDL 34 (L) 07/03/2017  ? LDLCALC 131 (H) 07/03/2017  ? TRIG 272 (H) 07/03/2017  ? CHOLHDL 6.4 07/03/2017  ?  ?Lab Results  ?Component Value Date  ? HGBA1C 5.9 (H) 07/02/2017  ? ? ?Assessment & Plan  ?  ?1. CAD: Prior stenting to LAD and LCx in 2001.  S/p DES x2 overlapping to dLM-pLAD, p-mLAD 100% in 2018. Stable with no anginal symptoms. No indication for ischemic evaluation.  Continue aspirin, Plavix, carvedilol, amlodipine, ramipril, and Crestor. ? ?2. Chronic systolic heart failure: Echo in 2018 showed EF 45 to 50%, hypokinesis of the anteroseptal and apical myocardium, mild mitral valve regurgitation. Euvolemic and well compensated on exam. He was scheduled for a repeat  echocardiogram, however this was never completed.  Will arrange for repeat echocardiogram to follow-up previously reduced EF/previous finding of mild mitral valve regurgitation.  Continue current medications as above.  If

## 2021-10-29 ENCOUNTER — Ambulatory Visit (INDEPENDENT_AMBULATORY_CARE_PROVIDER_SITE_OTHER): Payer: Medicare Other | Admitting: Nurse Practitioner

## 2021-10-29 ENCOUNTER — Encounter: Payer: Self-pay | Admitting: Nurse Practitioner

## 2021-10-29 ENCOUNTER — Other Ambulatory Visit: Payer: Self-pay

## 2021-10-29 VITALS — BP 128/70 | HR 66 | Resp 20 | Ht 69.0 in | Wt 202.0 lb

## 2021-10-29 DIAGNOSIS — I6523 Occlusion and stenosis of bilateral carotid arteries: Secondary | ICD-10-CM

## 2021-10-29 DIAGNOSIS — I10 Essential (primary) hypertension: Secondary | ICD-10-CM | POA: Diagnosis not present

## 2021-10-29 DIAGNOSIS — E785 Hyperlipidemia, unspecified: Secondary | ICD-10-CM | POA: Diagnosis not present

## 2021-10-29 DIAGNOSIS — Z01810 Encounter for preprocedural cardiovascular examination: Secondary | ICD-10-CM

## 2021-10-29 DIAGNOSIS — I2510 Atherosclerotic heart disease of native coronary artery without angina pectoris: Secondary | ICD-10-CM

## 2021-10-29 DIAGNOSIS — I5022 Chronic systolic (congestive) heart failure: Secondary | ICD-10-CM

## 2021-10-29 NOTE — Patient Instructions (Signed)
Medication Instructions:  ?No changes ?*If you need a refill on your cardiac medications before your next appointment, please call your pharmacy* ? ? ?Lab Work: ?No Labs ?If you have labs (blood work) drawn today and your tests are completely normal, you will receive your results only by: ?MyChart Message (if you have MyChart) OR ?A paper copy in the mail ?If you have any lab test that is abnormal or we need to change your treatment, we will call you to review the results. ? ? ?Testing/Procedures: [address]. ?Your physician has requested that you have an echocardiogram. Echocardiography is a painless test that uses sound waves to create images of your heart. It provides your doctor with information about the size and shape of your heart and how well your heart?s chambers and valves are working. This procedure takes approximately one hour. There are no restrictions for this procedure.  ? ?[address], [address]. ?Your physician has requested that you have a carotid duplex. This test is an ultrasound of the carotid arteries in your neck. It looks at blood flow through these arteries that supply the brain with blood. Allow one hour for this exam. There are no restrictions or special instructions.  ? ?Follow-Up: ?At [facility], you and your health needs are our priority.  As part of our continuing mission to provide you with exceptional heart care, we have created designated Provider Care Teams.  These Care Teams include your primary Cardiologist (physician) and Advanced Practice Providers (APPs -  Physician Assistants and Nurse Practitioners) who all work together to provide you with the care you need, when you need it. ? ?We recommend signing up for the patient portal called "MyChart".  Sign up information is provided on this After Visit Summary.  MyChart is used to connect with patients for Virtual Visits (Telemedicine).  Patients are able to view lab/test results, encounter  notes, upcoming appointments, etc.  Non-urgent messages can be sent to your provider as well.   ?To learn more about what you can do with MyChart, go to [email or web address].   ? ?Your next appointment:   ?4-6 month(s) ? ?The format for your next appointment:   ?In Person ? ?Provider:   ?Nanetta Batty, MD   ? ? ?  ?

## 2021-11-19 ENCOUNTER — Other Ambulatory Visit: Payer: Self-pay | Admitting: Nurse Practitioner

## 2021-11-19 ENCOUNTER — Ambulatory Visit (HOSPITAL_COMMUNITY)
Admission: RE | Admit: 2021-11-19 | Discharge: 2021-11-19 | Disposition: A | Discharge status: Home / Self Care | Payer: Medicare Other | Source: Ambulatory Visit | Attending: Cardiology | Admitting: Cardiology

## 2021-11-19 DIAGNOSIS — I6523 Occlusion and stenosis of bilateral carotid arteries: Secondary | ICD-10-CM | POA: Diagnosis present

## 2021-11-26 ENCOUNTER — Telehealth: Payer: Self-pay

## 2021-11-26 NOTE — Telephone Encounter (Signed)
Lmom, waiting on a return call to discuss results. 

## 2021-11-27 NOTE — Telephone Encounter (Signed)
Patient returned call to discuss test results. Please call back  ?

## 2021-11-27 NOTE — Telephone Encounter (Signed)
Done, just an FYI.  ?

## 2021-11-27 NOTE — Telephone Encounter (Signed)
Called pt back. Made aware of his results, he thanked me for calling him back. No further questions voiced at this time.  ?

## 2021-11-28 ENCOUNTER — Ambulatory Visit: Payer: Medicare Other | Admitting: Cardiovascular Disease

## 2021-12-03 ENCOUNTER — Ambulatory Visit (HOSPITAL_COMMUNITY): Payer: Medicare Other | Attending: Cardiology

## 2021-12-03 DIAGNOSIS — I5022 Chronic systolic (congestive) heart failure: Secondary | ICD-10-CM

## 2021-12-03 DIAGNOSIS — I2510 Atherosclerotic heart disease of native coronary artery without angina pectoris: Secondary | ICD-10-CM

## 2021-12-03 DIAGNOSIS — I5021 Acute systolic (congestive) heart failure: Secondary | ICD-10-CM | POA: Diagnosis not present

## 2021-12-03 DIAGNOSIS — I10 Essential (primary) hypertension: Secondary | ICD-10-CM

## 2021-12-03 DIAGNOSIS — E785 Hyperlipidemia, unspecified: Secondary | ICD-10-CM | POA: Diagnosis present

## 2021-12-03 DIAGNOSIS — Z01810 Encounter for preprocedural cardiovascular examination: Secondary | ICD-10-CM

## 2021-12-03 DIAGNOSIS — I6523 Occlusion and stenosis of bilateral carotid arteries: Secondary | ICD-10-CM | POA: Diagnosis present

## 2021-12-03 LAB — ECHOCARDIOGRAM COMPLETE
Area-P 1/2: 4.6 cm2
S' Lateral: 2.6 cm

## 2021-12-03 MED ORDER — PERFLUTREN LIPID MICROSPHERE
1.0000 mL | INTRAVENOUS | Status: AC | PRN
  Administered 2021-12-03: 2 mL via INTRAVENOUS

## 2021-12-05 ENCOUNTER — Telehealth: Payer: Self-pay

## 2021-12-05 NOTE — Telephone Encounter (Signed)
Lmom to discuss Echo results. Waiting on a return call.  ?

## 2021-12-05 NOTE — Telephone Encounter (Signed)
Pt returned my call to discuss echocardiogram results. Pt was notified of results and recommendations. Pt will follow up as planned.  ?

## 2022-03-10 ENCOUNTER — Encounter: Payer: Self-pay | Admitting: Cardiovascular Disease

## 2022-03-10 ENCOUNTER — Ambulatory Visit (INDEPENDENT_AMBULATORY_CARE_PROVIDER_SITE_OTHER): Payer: Medicare Other | Admitting: Cardiovascular Disease

## 2022-03-10 DIAGNOSIS — I10 Essential (primary) hypertension: Secondary | ICD-10-CM

## 2022-03-10 DIAGNOSIS — I255 Ischemic cardiomyopathy: Secondary | ICD-10-CM | POA: Insufficient documentation

## 2022-03-10 DIAGNOSIS — I2510 Atherosclerotic heart disease of native coronary artery without angina pectoris: Secondary | ICD-10-CM | POA: Diagnosis not present

## 2022-03-10 DIAGNOSIS — Z9861 Coronary angioplasty status: Secondary | ICD-10-CM

## 2022-03-10 DIAGNOSIS — E782 Mixed hyperlipidemia: Secondary | ICD-10-CM

## 2022-03-10 MED ORDER — NITROGLYCERIN 0.4 MG SL SUBL: 0.4000 mg | SUBLINGUAL_TABLET | SUBLINGUAL | 1 refills | Status: AC | PRN

## 2022-03-10 NOTE — Assessment & Plan Note (Signed)
History of hyperlipidemia on high-dose statin therapy followed by his PCP. 

## 2022-03-10 NOTE — Assessment & Plan Note (Signed)
EF at the time of his intervention in 2018 was 45%.  His most recent 2D echo performed/26/23 had normalized with an EF of 60 to 65%.

## 2022-03-10 NOTE — Assessment & Plan Note (Signed)
History of CAD status post stenting of the LAD and PCI of OM1 in 2001.  He had cardiac catheterization performed by Dr. Excell Seltzer 07/02/2018 with stenting of his proximal LAD using overlapping drug-eluting stents.  Unfortunately he had a "acute stent thrombosis" in the holding area and was brought back to the Cath Lab by Dr. Clifton James requiring reintervention.  At that time his EF was 45%.  He remains on dual antiplatelet therapy.  He denies chest pain.

## 2022-03-10 NOTE — Assessment & Plan Note (Signed)
History of essential hypertension blood pressure measured today at 118/60.  He is on carvedilol, amlodipine and ramipril.

## 2022-03-10 NOTE — Patient Instructions (Signed)
Medication Instructions:  No Changes In Medications at this time.  *If you need a refill on your cardiac medications before your next appointment, please call your pharmacy*  Follow-Up: At [facility], you and your health needs are our priority.  As part of our continuing mission to provide you with exceptional heart care, we have created designated Provider Care Teams.  These Care Teams include your primary Cardiologist (physician) and Advanced Practice Providers (APPs -  Physician Assistants and Nurse Practitioners) who all work together to provide you with the care you need, when you need it.  We recommend signing up for the patient portal called "MyChart".  Sign up information is provided on this After Visit Summary.  MyChart is used to connect with patients for Virtual Visits (Telemedicine).  Patients are able to view lab/test results, encounter notes, upcoming appointments, etc.  Non-urgent messages can be sent to your provider as well.   To learn more about what you can do with MyChart, go to [email or web address].    Your next appointment:   1 year(s)  The format for your next appointment:   In Person  Provider:   Jonathan Berry, MD 

## 2022-03-10 NOTE — Progress Notes (Signed)
03/10/2022 Clayton Anderson   11-May-1946  [id number]  Primary Physician Kandyce Rud, MD Primary Cardiologist: Runell Gess MD Roseanne Reno  HPI:  Clayton Anderson is a 76 y.o.  mildly overweight married Caucasian male father of 1 child who is retired from [employer].  He was referred for preoperative clearance before deep brain stimulation for nec I last saw him in the office 10/11/2020.  He is previously a patient of Dr. Donnie Aho and has seen Dr. Tomie China in the past as well.  His risk factors include treated hypertension hyperlipidemia.  He has a history of remote tobacco abuse.  He had LAD and circumflex stenting back in 2001 as well as a recent procedure by Dr. Excell Seltzer 07/02/2017 with stenting of his proximal LAD using overlapping DES stents.  He unfortunately had acute stent thrombosis in the holding area and was brought back to the Cath Lab by Dr. Clifton James requiring re-intervention.  His EF is in the 45% range by 2D echo performed 07/03/2017.   Since I saw him a year and a half ago he continues to do well.  He denies chest pain or shortness of breath.  He did have a 2D echo performed 12/03/2021 that showed normalization of his LV function with an EF of 60 to 65%..   Current Meds  Medication Sig   amLODipine (NORVASC) 10 MG tablet Take 1 tablet (10 mg total) by mouth daily.   aspirin EC 81 MG tablet Take 81 mg by mouth daily.   carvedilol (COREG) 3.125 MG tablet Take 1 tablet (3.125 mg total) by mouth 2 (two) times daily with a meal.   clopidogrel (PLAVIX) 75 MG tablet Take 1 tablet (75 mg total) by mouth daily.   DULoxetine (CYMBALTA) 60 MG capsule Take 120 mg by mouth daily.    escitalopram (LEXAPRO) 10 MG tablet Take 10 mg by mouth daily. Reported on 02/03/2016   gabapentin (NEURONTIN) 300 MG capsule Take 900 mg by mouth 3 (three) times daily.    omeprazole (PRILOSEC) 40 MG capsule Take by mouth.   Oxycodone HCl 20 MG TABS Take 40-60 mg by mouth every 4 (four) hours as needed  (pain).   ramipril (ALTACE) 10 MG capsule Take 10 mg by mouth daily.   rosuvastatin (CRESTOR) 40 MG tablet Take 1 tablet (40 mg total) by mouth daily at 6 PM.   tamsulosin (FLOMAX) 0.4 MG CAPS capsule Take 0.4 mg by mouth.   tiZANidine (ZANAFLEX) 2 MG tablet Take 2-4 mg by mouth 4 (four) times daily as needed for muscle spasms.   [DISCONTINUED] nitroGLYCERIN (NITROSTAT) 0.4 MG SL tablet Place 1 tablet (0.4 mg total) under the tongue every 5 (five) minutes as needed.     Allergies  Allergen Reactions   Sulfa Antibiotics Hives and Other (See Comments)   Tetanus Toxoids Swelling and Other (See Comments)    Social History   Socioeconomic History   Marital status: Married    Spouse name: Bartholomew Ramesh   Number of children: Y   Years of education: Not on file   Highest education level: Not on file  Occupational History   Occupation: retired Curator  Tobacco Use   Smoking status: Former    Packs/day: 0.90    Years: 40.00    Total pack years: 36.00    Types: Cigarettes    Quit date: 07/19/2004    Years since quitting: 17.6   Smokeless tobacco: Never  Substance and Sexual Activity  Alcohol use: Yes    Alcohol/week: 0.0 standard drinks of alcohol    Comment: occasionally   Drug use: No   Sexual activity: Never  Other Topics Concern   Not on file  Social History Narrative   Not on file   Social Determinants of Health   Financial Resource Strain: Not on file  Food Insecurity: Not on file  Transportation Needs: Not on file  Physical Activity: Not on file  Stress: Not on file  Social Connections: Not on file  Intimate Partner Violence: Not on file     Review of Systems: General: negative for chills, fever, night sweats or weight changes.  Cardiovascular: negative for chest pain, dyspnea on exertion, edema, orthopnea, palpitations, paroxysmal nocturnal dyspnea or shortness of breath Dermatological: negative for rash Respiratory: negative for cough or wheezing Urologic:  negative for hematuria Abdominal: negative for nausea, vomiting, diarrhea, bright red blood per rectum, melena, or hematemesis Neurologic: negative for visual changes, syncope, or dizziness All other systems reviewed and are otherwise negative except as noted above.    Blood pressure 118/60, pulse 68, height 5\' 9"  (1.753 m), weight 195 lb 3.2 oz (88.5 kg), SpO2 93 %.  General appearance: alert and no distress Neck: no adenopathy, no JVD, supple, symmetrical, trachea midline, thyroid not enlarged, symmetric, no tenderness/mass/nodules, and soft right carotid bruit Lungs: clear to auscultation bilaterally Heart: regular rate and rhythm, S1, S2 normal, no murmur, click, rub or gallop Extremities: extremities normal, atraumatic, no cyanosis or edema Pulses: 2+ and symmetric Skin: Skin color, texture, turgor normal. No rashes or lesions Neurologic: Grossly normal  EKG not performed today  ASSESSMENT AND PLAN:   Hypertension History of essential hypertension blood pressure measured today at 118/60.  He is on carvedilol, amlodipine and ramipril.  Hyperlipidemia History of hyperlipidemia on high-dose statin therapy followed by his PCP.  CAD S/P percutaneous coronary angioplasty History of CAD status post stenting of the LAD and PCI of OM1 in 2001.  He had cardiac catheterization performed by Dr. 2002 07/02/2018 with stenting of his proximal LAD using overlapping drug-eluting stents.  Unfortunately he had a "acute stent thrombosis" in the holding area and was brought back to the Cath Lab by Dr. 07/04/2018 requiring reintervention.  At that time his EF was 45%.  He remains on dual antiplatelet therapy.  He denies chest pain.  Ischemic cardiomyopathy EF at the time of his intervention in 2018 was 45%.  His most recent 2D echo performed/26/23 had normalized with an EF of 60 to 65%.     2019 MD FACP,FACC,FAHA, [facility] 03/10/2022 12:03 PM

## 2024-01-24 ENCOUNTER — Emergency Department

## 2024-01-24 ENCOUNTER — Emergency Department
Admission: EM | Admit: 2024-01-24 | Discharge: 2024-01-24 | Disposition: A | Discharge status: Home / Self Care | Attending: Emergency Medicine | Admitting: Emergency Medicine

## 2024-01-24 ENCOUNTER — Other Ambulatory Visit: Payer: Self-pay

## 2024-01-24 DIAGNOSIS — J449 Chronic obstructive pulmonary disease, unspecified: Secondary | ICD-10-CM | POA: Insufficient documentation

## 2024-01-24 DIAGNOSIS — I2510 Atherosclerotic heart disease of native coronary artery without angina pectoris: Secondary | ICD-10-CM | POA: Insufficient documentation

## 2024-01-24 DIAGNOSIS — I10 Essential (primary) hypertension: Secondary | ICD-10-CM | POA: Diagnosis not present

## 2024-01-24 DIAGNOSIS — R0789 Other chest pain: Secondary | ICD-10-CM | POA: Insufficient documentation

## 2024-01-24 DIAGNOSIS — R079 Chest pain, unspecified: Secondary | ICD-10-CM

## 2024-01-24 LAB — CBC
HCT: 44.1 % (ref 39.0–52.0)
Hemoglobin: 14.5 g/dL (ref 13.0–17.0)
MCH: 28.3 pg (ref 26.0–34.0)
MCHC: 32.9 g/dL (ref 30.0–36.0)
MCV: 86 fL (ref 80.0–100.0)
Platelets: 296 10*3/uL (ref 150–400)
RBC: 5.13 MIL/uL (ref 4.22–5.81)
RDW: 12.8 % (ref 11.5–15.5)
WBC: 6.6 10*3/uL (ref 4.0–10.5)
nRBC: 0 % (ref 0.0–0.2)

## 2024-01-24 LAB — BASIC METABOLIC PANEL WITH GFR
Anion gap: 9 (ref 5–15)
BUN: 16 mg/dL (ref 8–23)
CO2: 25 mmol/L (ref 22–32)
Calcium: 9.2 mg/dL (ref 8.9–10.3)
Chloride: 104 mmol/L (ref 98–111)
Creatinine, Ser: 1.17 mg/dL (ref 0.61–1.24)
GFR, Estimated: >60 mL/min (ref >60)
Glucose, Bld: 118 mg/dL — ABNORMAL HIGH (ref 70–99)
Potassium: 3.7 mmol/L (ref 3.5–5.1)
Sodium: 138 mmol/L (ref 135–145)

## 2024-01-24 LAB — TROPONIN I (HIGH SENSITIVITY)
Troponin I (High Sensitivity): 6 ng/L (ref <18)
Troponin I (High Sensitivity): 5 ng/L (ref <18)

## 2024-01-24 MED ORDER — LORATADINE 10 MG PO TABS
10.0000 mg | ORAL_TABLET | Freq: Once | ORAL | Status: AC
  Administered 2024-01-24: 10 mg via ORAL
  Filled 2024-01-24: qty 1

## 2024-01-24 NOTE — ED Triage Notes (Addendum)
 Pt comes with lower mid cp and fatigue that started today. Pt states cough and trouble breathing. Pt also states itchy and runny eyes. Pt states hx of stents in past. Pt states this is similar like when he had heart attack. Pt states it has been going on for week but worse today. Pt having trouble catching breath. Pt is on thinner

## 2024-01-24 NOTE — ED Notes (Signed)
 PT is CAOx4, breathing normally, and normal in color. Pt is complaining of lower center, non-radiating chest pain that started today. PT reports being sick with allergy symptoms (cough, itchy watery eyes) for approx 1 week [place]. PT in NAD at this time and denies any needs. Pt wife at bedside.

## 2024-01-24 NOTE — ED Provider Notes (Signed)
 [facility] Provider Note    Event Date/Time   First MD Initiated Contact with Patient 01/24/24 1557     (approximate)   History   Chief Complaint Chest Pain   HPI  Clayton Anderson is a 78 y.o. male with past medical history of hypertension, hyperlipidemia, CAD, atrial fibrillation, COPD, GERD, and chronic pain syndrome who presents to the ED complaining of chest pain.  Patient reports that he has been dealing with central aching discomfort in his chest starting earlier today.  This was preceded by a few days of dry cough, mild difficulty breathing, and itchy/watery eyes.  He denies any fevers, states that symptoms have improved significantly since he has arrived to the ED and denies any ongoing chest pain or difficulty breathing.  He has not noticed any pain or swelling in his legs.     Physical Exam   Triage Vital Signs: ED Triage Vitals  Encounter Vitals Group     BP 01/24/24 1430 (!) 152/69     Girls Systolic BP Percentile --      Girls Diastolic BP Percentile --      Boys Systolic BP Percentile --      Boys Diastolic BP Percentile --      Pulse Rate 01/24/24 1430 94     Resp 01/24/24 1430 18     Temp 01/24/24 1430 (!) 97.5 F (36.4 C)     Temp Source 01/24/24 1430 Oral     SpO2 01/24/24 1430 98 %     Weight 01/24/24 1427 200 lb (90.7 kg)     Height 01/24/24 1427 5' 8 (1.727 m)     Head Circumference --      Peak Flow --      Pain Score 01/24/24 1427 0     Pain Loc --      Pain Education --      Exclude from Growth Chart --     Most recent vital signs: Vitals:   01/24/24 1430  BP: (!) 152/69  Pulse: 94  Resp: 18  Temp: (!) 97.5 F (36.4 C)  SpO2: 98%    Constitutional: Alert and oriented. Eyes: Conjunctivae are normal. Head: Atraumatic. Nose: No congestion/rhinnorhea. Mouth/Throat: Mucous membranes are moist.  Cardiovascular: Normal rate, regular rhythm. Grossly normal heart sounds.  2+ radial pulses bilaterally. Respiratory:  Normal respiratory effort.  No retractions. Lungs CTAB.  Central chest wall tenderness to palpation noted. Gastrointestinal: Soft and nontender. No distention. Musculoskeletal: No lower extremity tenderness nor edema.  Neurologic:  Normal speech and language. No gross focal neurologic deficits are appreciated.    ED Results / Procedures / Treatments   Labs (all labs ordered are listed, but only abnormal results are displayed) Labs Reviewed  BASIC METABOLIC PANEL WITH GFR - Abnormal; Notable for the following components:      Result Value   Glucose, Bld 118 (*)    All other components within normal limits  CBC  TROPONIN I (HIGH SENSITIVITY)  TROPONIN I (HIGH SENSITIVITY)     EKG  ED ECG REPORT I, Twilla Galea, the attending physician, personally viewed and interpreted this ECG.   Date: 01/24/2024  EKG Time: 14:23  Rate: 69  Rhythm: normal sinus rhythm  Axis: Normal  Intervals:none  ST&T Change: None  RADIOLOGY Chest x-ray reviewed and interpreted by me with no infiltrate, edema, or effusion.  PROCEDURES:  Critical Care performed: No  Procedures   MEDICATIONS ORDERED IN ED: Medications  loratadine (CLARITIN) tablet  10 mg (10 mg Oral Given 01/24/24 1627)     IMPRESSION / MDM / ASSESSMENT AND PLAN / ED COURSE  I reviewed the triage vital signs and the nursing notes.                              78 y.o. male with past medical history of hypertension, hyperlipidemia, CAD, atrial fibrillation, COPD, GERD, and chronic pain syndrome who presents to the ED with central chest discomfort starting earlier today preceded by a few days of dry cough, watery eyes, and mild difficulty breathing.  Patient's presentation is most consistent with acute presentation with potential threat to life or bodily function.  Differential diagnosis includes, but is not limited to, ACS, PE, pneumonia, bronchitis, pneumothorax, musculoskeletal pain, GERD, anxiety, allergic  rhinitis.  Patient nontoxic-appearing and in no acute distress, vital signs are unremarkable.  EKG shows no evidence of arrhythmia or ischemia, initial troponin within normal limits.  Given acute onset, we will check second set troponin, but additional labs are reassuring with no significant anemia, leukocytosis, electrolyte abnormality, or AKI.  Chest x-ray is unremarkable and I doubt PE or dissection given resolving symptoms.  Symptoms seem likely due to bronchitis with potential allergic component given his injury and watery eyes.  Repeat troponin within normal limits, patient continues to state that he feels well with no ongoing chest pain or shortness of breath.  He is appropriate for discharge home with outpatient follow-up, was counseled to return to the ED for new or worsening symptoms.  Patient agrees with plan.      FINAL CLINICAL IMPRESSION(S) / ED DIAGNOSES   Final diagnoses:  Nonspecific chest pain     Rx / DC Orders   ED Discharge Orders     None        Note:  This document was prepared using Dragon voice recognition software and may include unintentional dictation errors.   Twilla Galea, MD 01/24/24 [phone]

## 2024-03-16 NOTE — Progress Notes (Signed)
 This encounter was created in error - please disregard.
